# Patient Record
Sex: Female | Born: 1935 | Race: White | Hispanic: No | Smoking: Former smoker
Health system: Southern US, Community
[De-identification: ages and names within clinical notes are randomized; demographics above are authoritative.]

## PROBLEM LIST (undated history)

## (undated) DIAGNOSIS — J189 Pneumonia, unspecified organism: Secondary | ICD-10-CM

## (undated) DIAGNOSIS — R0609 Other forms of dyspnea: Secondary | ICD-10-CM

## (undated) DIAGNOSIS — J449 Chronic obstructive pulmonary disease, unspecified: Secondary | ICD-10-CM

## (undated) DIAGNOSIS — R0600 Dyspnea, unspecified: Secondary | ICD-10-CM

## (undated) DIAGNOSIS — I10 Essential (primary) hypertension: Secondary | ICD-10-CM

## (undated) DIAGNOSIS — K219 Gastro-esophageal reflux disease without esophagitis: Secondary | ICD-10-CM

## (undated) DIAGNOSIS — E119 Type 2 diabetes mellitus without complications: Secondary | ICD-10-CM

## (undated) DIAGNOSIS — E785 Hyperlipidemia, unspecified: Secondary | ICD-10-CM

## (undated) HISTORY — DX: Type 2 diabetes mellitus without complications: E11.9

## (undated) HISTORY — DX: Essential (primary) hypertension: I10

## (undated) HISTORY — PX: OTHER SURGICAL HISTORY: SHX169

## (undated) HISTORY — DX: Chronic obstructive pulmonary disease, unspecified: J44.9

## (undated) HISTORY — PX: BRAIN SURGERY: SHX531

## (undated) HISTORY — PX: TOTAL KNEE ARTHROPLASTY: SHX125

## (undated) HISTORY — PX: TONSILLECTOMY: SUR1361

## (undated) HISTORY — PX: CHOLECYSTECTOMY: SHX55

## (undated) HISTORY — DX: Hyperlipidemia, unspecified: E78.5

## (undated) HISTORY — DX: Gastro-esophageal reflux disease without esophagitis: K21.9

## (undated) HISTORY — DX: Dyspnea, unspecified: R06.00

## (undated) HISTORY — DX: Other forms of dyspnea: R06.09

---

## 2000-07-27 ENCOUNTER — Encounter: Admission: RE | Admit: 2000-07-27 | Discharge: 2000-07-27 | Payer: Self-pay | Admitting: Urology

## 2000-07-27 ENCOUNTER — Encounter: Payer: Self-pay | Admitting: Urology

## 2000-08-19 ENCOUNTER — Observation Stay (HOSPITAL_COMMUNITY): Admission: RE | Admit: 2000-08-19 | Discharge: 2000-08-20 | Payer: Self-pay | Admitting: Urology

## 2008-10-25 ENCOUNTER — Ambulatory Visit (HOSPITAL_BASED_OUTPATIENT_CLINIC_OR_DEPARTMENT_OTHER): Admission: RE | Admit: 2008-10-25 | Discharge: 2008-10-25 | Payer: Self-pay | Admitting: Urology

## 2010-04-17 ENCOUNTER — Ambulatory Visit: Payer: Self-pay | Admitting: Cardiovascular Disease

## 2010-04-24 ENCOUNTER — Telehealth: Payer: Self-pay | Admitting: Cardiovascular Disease

## 2010-05-01 ENCOUNTER — Telehealth: Payer: Self-pay | Admitting: Cardiovascular Disease

## 2010-07-29 NOTE — Progress Notes (Signed)
  Phone Note From Other Clinic   Caller: [facility] Call For: Dr. Kirke Corin Summary of Call: Echo films not good-need order for Dobutamine Stress test for tomorrow.  order faxed to registration. Initial call taken by: Dessie Coma  LPN,  April 24, 2010 11:07 AM     Appended Document:  Patient requests appt. for next week to give her a break.  Appt scheduled for Nov. 1st at 8am.  Patient notified.

## 2010-07-29 NOTE — Progress Notes (Signed)
Summary: stress test results  Phone Note Outgoing Call   Call placed by: Dessie Coma  LPN,  May 01, 2010 2:01 PM Call placed to: Patient Summary of Call: LMVM-notified patient stress test results was fine per Dr. Kirke Corin.  To call if any questions and copy will be faxed to [facility].

## 2010-10-08 LAB — POCT I-STAT 4, (NA,K, GLUC, HGB,HCT)
Glucose, Bld: 137 mg/dL — ABNORMAL HIGH (ref 70–99)
HCT: 39 % (ref 36.0–46.0)
Hemoglobin: 13.3 g/dL (ref 12.0–15.0)
Potassium: 4.3 mEq/L (ref 3.5–5.1)
Sodium: 136 mEq/L (ref 135–145)

## 2010-10-08 LAB — GLUCOSE, CAPILLARY: Glucose-Capillary: 113 mg/dL — ABNORMAL HIGH (ref 70–99)

## 2010-10-11 ENCOUNTER — Encounter: Payer: Self-pay | Admitting: Cardiovascular Disease

## 2010-11-11 NOTE — Op Note (Signed)
NAMEJENNYFER, Hannah [name]                 ACCOUNT NO.:  [id number]   MEDICAL RECORD NO.:  [id number]          PATIENT TYPE:  AMB   LOCATION:  [facility]                         FACILITY:  [facility]   PHYSICIAN:  Sigmund I. Patsi Sears, M.D.DATE OF BIRTH:  16-Jul-1931   DATE OF PROCEDURE:  10/25/2008  DATE OF DISCHARGE:                               OPERATIVE REPORT   PREOPERATIVE DIAGNOSIS:  1. Intrinsic sphincter deficiency.  2. Vaginal suture extrusion.   OPERATIONS:  1. Vaginal exploration, and excision of extruded Prolene suture.  2. Urethral injection with Macroplastique.   SURGEON:  Dr. Patsi Sears.   ANESTHESIA:  General LMA.   PREPARATION:  After appropriate preanesthesia, the patient is brought to  the operating room and placed on the operating room table in the dorsal  supine position where general LMA anesthesia was introduced.  She was  then replaced in dorsal lithotomy position where the pubis was prepped  with Betadine solution and draped in the usual fashion.   BRIEF HISTORY:  This 77 year old type 2 diabetic female, is status post  pelvic floor repair in 2002 for stress urinary incontinence and pelvic  floor descent, with a 40 pack year history of smoking.  The patient  currently has urinary incontinence, with three pads per day.  Urodynamics show a Valsalva leak point pressure of 45 cm of water,  indicating intrinsic sphincter deficiency.  Physical exam shows that she  has extrusion of vaginal permanent suture material on the left side, and  possibly on the right side.   PROCEDURE:  Vaginal inspection reveals extrusion of suture as noted in  the history on the left side.  Inspection reveals that the right side  permanent suture is submucosal, and can be seen through thinned vaginal  mucosa.  The suture is grasped with on the left side, and cut flush with  the vaginal mucosa, and removed.  No incision was made on the left side.  On the right side, an elliptical incision is made,  and the subcutaneous  Prolene suture is removed.  This appears to be a previous knot  formation.  The wound was irrigated with antibiotic irrigation, and  closed with three separate 2-0 Vicryl sutures.  No bleeding was noted.  Attention was then directed to the urethra, and the  bladder was drained of fluid.  The Foley is removed.  Cystoscope was  placed, and two vials of Macroplastique are injected into the  suburethral tissue, in the area of the urethral sphincter.  The patient  tolerated the procedure well.  She was awakened and taken to the  recovery room in good condition.      Sigmund I. Patsi Sears, M.D.  Electronically Signed     SIT/MEDQ  D:  10/25/2008  T:  10/25/2008  Job:  [id number]   cc:   Nadine Counts  Fax: [phone]

## 2010-11-11 NOTE — Letter (Signed)
April 17, 2010    Gaye Alken, N.P.  [facility]  [address]  [place], [place]  [address]   RE:  Hannah, [name]  MRN:  [id number]  /  DOB:  [birth year]   Dear Amy:   Thank you for referring Hannah [name] for further cardiac evaluation.  As  you are aware, this is a pleasant 77 year old female with the following  problem list:   1. Chronic obstructive pulmonary disease diagnosed about 30 years ago      according to the patient.  2. Prolonged history of tobacco use, quit recently in 2011.  3. Type 2 diabetes for at least 5 years.  4. Hypertension.  5. Hyperlipidemia.  6. Gastroesophageal reflux disease.   Hannah [name] is here today for evaluation of dyspnea and abnormal ECG.  The patient had chronic dyspnea for many years thought to be related to  her COPD.  She denies any chest pain.  She gets occasional dizziness.  She gets spells of dry cough with minimal sputum.  She is not able to do  much physical activity due to her significant dyspnea.  She has chronic  tachycardia.  She denies any previous cardiac history.  She had an  electrocardiogram done recently which was suggestive of septal infarct.  The patient has not had any stress testing or echocardiogram in the  past.  There has been no orthopnea, PND or lower extremity edema.   PAST MEDICAL HISTORY:  As outlined above.   MEDICATIONS:  1. Fluticasone 50 mcg once daily.  2. Metformin 500 mg two tablets in the morning and 500 in the      afternoon.  3. Protonix 40 mg once daily.  4. Amitriptyline 100 mg once daily.  5. Fenofibrate 200 mg once daily.  6. Lipitor 10 mg at bedtime.  7. Diovan hydrochlorothiazide 80/12.5 mg once daily.  8. Sanctura XR 60 mg once daily.  9. Calcium.  10.Spiriva.  11.Aspirin 325 mg once daily.   ALLERGIES:  INCLUDE CODEINE AND SULFA.   SOCIAL HISTORY:  This is remarkable for smoking one pack per day for at  least 30 years.  She quit in April of 2011.  There is no alcohol or  recreational drug use.  The patient does not exercise regularly.  She is  a widow.  She retired from a [employer] here in [place].   PAST SURGICAL HISTORY:  1. Left arm surgery.  2. Tonsillectomy.  3. Knee replacement.  4. Cholecystectomy.  5. 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   FAMILY HISTORY:  There is no family history of premature coronary artery  disease.  Her sister did have myocardial infarction but at the age of  56.  Her parents also had heart disease but both lived to be above 19  years old.   PHYSICAL EXAMINATION:  GENERAL:  The patient is pleasant, seems to be in  no acute distress although she has dyspnea even with walking from the  chair to the exam table.  VITAL SIGNS:  Weight is 168 pounds, blood pressure is 117/66, pulse is  111, oxygen saturation is 96% on room air.  HEENT:  Normocephalic/atraumatic.  NECK:  No JVD or carotid bruits.  RESPIRATORY:  Normal respiratory effort with no use of accessory  muscles.  Auscultation reveals significantly diminished breath sounds  bilaterally.  CARDIOVASCULAR:  Normal PMI, Normal S1 - S2 with no gallops or murmurs.  ABDOMEN:  Benign, nontender, nondistended.  EXTREMITIES:  With no clubbing, cyanosis or edema.  SKIN:  Warm and dry with no rash.  PSYCHIATRIC:  She is alert, oriented x3 with normal mood and affect.  MUSCULOSKELETAL:  There is normal muscle strength in the upper and lower  extremities.   An electrocardiogram was performed which showed sinus tachycardia with Q-  waves in V1 - V2 suggestive of prior septal infarct.  There are no  significant ST or T-wave changes.   IMPRESSION:  1. Exertional dyspnea with an abnormal ECG.  The patient does not have      any chest pain.  We will have to rule out angina equivalent      although I suspect that most of her dyspnea is related to her      chronic obstructive pulmonary disease.  Her electrocardiogram is      abnormal with  septal Q-waves which occasionally can be a normal      variant.  I recommend proceeding with dobutamine stress echo for      further evaluation.  I explained to her that even if her stress      test comes back normal, aggressive treatment of her risk factors is      recommended as is being done.  2. Hyperlipidemia:  She is taking fenofibrate and Lipitor.  Her target      LDL should be less than 100 due to her history of diabetes.  3. Hypertension:  Her blood pressure is well-controlled.  I suspect      that her tachycardia is related to chronic obstructive pulmonary      disease.  The patient should continue lifelong treatment with      aspirin regardless of the results of the stress test.  She will be      notified with the results and will follow up with me if the stress      test is abnormal.   Thank you for allowing me to participate in the care of your patient.    Sincerely,      Lorine Bears, MD  Electronically Signed    MA/MedQ  DD: 04/17/2010  DT: 04/17/2010  Job #: [id number]

## 2010-11-14 NOTE — Op Note (Signed)
[facility]  Patient:    JULE, WHITSEL                        MRN: [id number] Adm. Date:  [id number] Attending:  Laqueta Jean CC:         Dr. [facility]   Operative Report  PREOPERATIVE DIAGNOSIS:  Urinary incontinence.  POSTOPERATIVE DIAGNOSIS:  Urinary incontinence.  OPERATION:  Anterior vaginal vault repair.  Pubovaginal sling.  Rectocele repair.  SURGEON:  Sigmund I. Patsi Sears, M.D.  ANESTHESIA:  General (LMA).  PROCEDURE:  After appropriate preanesthesia, the patient was brought to the operating room and placed on the operating room table in the dorsal supine position where general LMA anesthesia was induced.  She was then replaced in the low Allen stirrup dorsolithotomy position.  The pubis was prepped with Betadine solution and draped in the usual fashion.  REVIEW OF HISTORY:  This 77 year old widowed white female, para 2-2-0, has mixed stress and urge incontinence, worsening over the last 20 years.  She cannot exercise because of her stress incontinence symptoms.  She has a 30-pack-year history of smoking and a 30-pound weight gain since her retirement three years ago.  The physical examination showed a positive Marshall test and a positive Q-Tip test with a grade 2-3 cystourethrocele and a rectocele found.  She had urodynamics showing a 14 cc/second maximum flow (75% percentile), with evidence of bladder instability with Valsalva maneuver. She has decreased compliance but obstructed flow.  She had a leakpoing pressure of only 11 cm of water.  She now presents for a pubovaginal sling and vaginal vault repair.  PROCEDURE:  20 cc of 0.5% Marcaine with 1:200,000 were injected into the pubovaginal cervical arch tissue.  A midline incision was then made from the area just anterior to the cervix to the base of the urethra, and subcutaneous tissue was dissected with the Strully scissors.  Tissue was dissected  both bluntly and sharply into the retropubic space bilaterally.  Following this, anterior vaginal vault repair was accomplished with horizontal mattress 3-0 Vicryl pop-off sutures, and using the vaginal anchor system, #1 Prolene sutures were placed in the retropubic bone.  A T-shaped portion of 6 x 8 ______ fascia was then marked and cut.  The edges were folded and sutured with 3-0 Vicryl sutures, and the T-shaped wings were then passed into the retropubic space and ligated over the Prolene sutures. Following this, care was taken to avoid making the fascia too tight, by placing a right angle clamp in between the fascia and the urethra.  The fascia was packed in the midline at the 12 oclock position with 3-0 Vicryl suture.  It was also tacked at the 3 oclock and 9 oclock positions with the same suture.  The T-shaped portion was then brought over the [facility] plication of the vaginal vault and tacked in position at the 5 and 7 oclock positions.  The devascularized tip of the vaginal tissue was excised, and 3-0 Vicryl subcu sutures were then placed in horizontal mattress fashion, and then buried 3-0 Vicryl running suture was used to accomplish vaginal epithelial closure.  Attention was then directed to the lax rectocele.  10 cc of normal saline were injected into the perirectal vaginal fascia bilaterally.  The patient had previously had a small rectocele repair.  A horizontal incision was then made at the area just inside the anus, and tissue was then dissected lateralward and brought into a point  at the posterior portion of the vagina.  The epithelium was dissected free from the rectal mucosa and discarded.  Minimal bleeding was noted.  A portion of fascia was then placed in the wound and sutured in place along the edges with 3-0 Vicryl sutures.  The epithelium was then closed with buried 2-0 Vicryl sutures.  This afforded excellent rectocele repair.  Following this, the wound was irrigated  with antibiotic irrigation. No bleeding was noted.  The vagina was packed with vaginal packing with antibiotic ointment, and the patient received IV Toradol ______ suppository. She was awakened and taken to the recovery room in excellent condition. DD:  08/19/00 TD:  08/20/00 Job: [id number]

## 2011-01-09 ENCOUNTER — Encounter: Payer: Self-pay | Admitting: Cardiovascular Disease

## 2011-04-24 ENCOUNTER — Ambulatory Visit: Payer: Self-pay | Admitting: Internal Medicine

## 2015-04-11 DIAGNOSIS — J208 Acute bronchitis due to other specified organisms: Secondary | ICD-10-CM | POA: Diagnosis not present

## 2015-04-11 DIAGNOSIS — Z6827 Body mass index (BMI) 27.0-27.9, adult: Secondary | ICD-10-CM | POA: Diagnosis not present

## 2015-04-11 DIAGNOSIS — Z720 Tobacco use: Secondary | ICD-10-CM | POA: Diagnosis not present

## 2015-04-11 DIAGNOSIS — J449 Chronic obstructive pulmonary disease, unspecified: Secondary | ICD-10-CM | POA: Diagnosis not present

## 2015-04-13 DIAGNOSIS — J449 Chronic obstructive pulmonary disease, unspecified: Secondary | ICD-10-CM | POA: Diagnosis not present

## 2015-04-16 DIAGNOSIS — J449 Chronic obstructive pulmonary disease, unspecified: Secondary | ICD-10-CM | POA: Diagnosis not present

## 2015-04-29 DIAGNOSIS — D510 Vitamin B12 deficiency anemia due to intrinsic factor deficiency: Secondary | ICD-10-CM | POA: Diagnosis not present

## 2015-05-14 DIAGNOSIS — J449 Chronic obstructive pulmonary disease, unspecified: Secondary | ICD-10-CM | POA: Diagnosis not present

## 2015-05-14 DIAGNOSIS — Z6828 Body mass index (BMI) 28.0-28.9, adult: Secondary | ICD-10-CM | POA: Diagnosis not present

## 2015-05-14 DIAGNOSIS — D510 Vitamin B12 deficiency anemia due to intrinsic factor deficiency: Secondary | ICD-10-CM | POA: Diagnosis not present

## 2015-05-14 DIAGNOSIS — E1149 Type 2 diabetes mellitus with other diabetic neurological complication: Secondary | ICD-10-CM | POA: Diagnosis not present

## 2015-05-14 DIAGNOSIS — E785 Hyperlipidemia, unspecified: Secondary | ICD-10-CM | POA: Diagnosis not present

## 2015-05-14 DIAGNOSIS — I10 Essential (primary) hypertension: Secondary | ICD-10-CM | POA: Diagnosis not present

## 2015-05-17 DIAGNOSIS — J449 Chronic obstructive pulmonary disease, unspecified: Secondary | ICD-10-CM | POA: Diagnosis not present

## 2015-05-18 DIAGNOSIS — E119 Type 2 diabetes mellitus without complications: Secondary | ICD-10-CM | POA: Diagnosis not present

## 2015-05-31 DIAGNOSIS — D510 Vitamin B12 deficiency anemia due to intrinsic factor deficiency: Secondary | ICD-10-CM | POA: Diagnosis not present

## 2015-05-31 DIAGNOSIS — Z6822 Body mass index (BMI) 22.0-22.9, adult: Secondary | ICD-10-CM | POA: Diagnosis not present

## 2015-05-31 DIAGNOSIS — I10 Essential (primary) hypertension: Secondary | ICD-10-CM | POA: Diagnosis not present

## 2015-06-06 DIAGNOSIS — Z1231 Encounter for screening mammogram for malignant neoplasm of breast: Secondary | ICD-10-CM | POA: Diagnosis not present

## 2015-06-13 DIAGNOSIS — D510 Vitamin B12 deficiency anemia due to intrinsic factor deficiency: Secondary | ICD-10-CM | POA: Diagnosis not present

## 2015-06-13 DIAGNOSIS — M1990 Unspecified osteoarthritis, unspecified site: Secondary | ICD-10-CM | POA: Diagnosis not present

## 2015-06-13 DIAGNOSIS — Z6828 Body mass index (BMI) 28.0-28.9, adult: Secondary | ICD-10-CM | POA: Diagnosis not present

## 2015-06-13 DIAGNOSIS — J449 Chronic obstructive pulmonary disease, unspecified: Secondary | ICD-10-CM | POA: Diagnosis not present

## 2015-06-13 DIAGNOSIS — E1149 Type 2 diabetes mellitus with other diabetic neurological complication: Secondary | ICD-10-CM | POA: Diagnosis not present

## 2015-06-13 DIAGNOSIS — R69 Illness, unspecified: Secondary | ICD-10-CM | POA: Diagnosis not present

## 2015-06-13 DIAGNOSIS — I10 Essential (primary) hypertension: Secondary | ICD-10-CM | POA: Diagnosis not present

## 2015-06-16 DIAGNOSIS — J449 Chronic obstructive pulmonary disease, unspecified: Secondary | ICD-10-CM | POA: Diagnosis not present

## 2015-06-26 DIAGNOSIS — S81001A Unspecified open wound, right knee, initial encounter: Secondary | ICD-10-CM | POA: Diagnosis not present

## 2015-06-26 DIAGNOSIS — Z23 Encounter for immunization: Secondary | ICD-10-CM | POA: Diagnosis not present

## 2015-06-26 DIAGNOSIS — Z6827 Body mass index (BMI) 27.0-27.9, adult: Secondary | ICD-10-CM | POA: Diagnosis not present

## 2015-07-02 DIAGNOSIS — M25531 Pain in right wrist: Secondary | ICD-10-CM | POA: Diagnosis not present

## 2015-07-09 DIAGNOSIS — J441 Chronic obstructive pulmonary disease with (acute) exacerbation: Secondary | ICD-10-CM | POA: Diagnosis not present

## 2015-07-09 DIAGNOSIS — Z6827 Body mass index (BMI) 27.0-27.9, adult: Secondary | ICD-10-CM | POA: Diagnosis not present

## 2015-07-09 DIAGNOSIS — S81012D Laceration without foreign body, left knee, subsequent encounter: Secondary | ICD-10-CM | POA: Diagnosis not present

## 2015-07-09 DIAGNOSIS — R42 Dizziness and giddiness: Secondary | ICD-10-CM | POA: Diagnosis not present

## 2015-07-09 DIAGNOSIS — D510 Vitamin B12 deficiency anemia due to intrinsic factor deficiency: Secondary | ICD-10-CM | POA: Diagnosis not present

## 2015-07-14 DIAGNOSIS — J449 Chronic obstructive pulmonary disease, unspecified: Secondary | ICD-10-CM | POA: Diagnosis not present

## 2015-07-17 DIAGNOSIS — J449 Chronic obstructive pulmonary disease, unspecified: Secondary | ICD-10-CM | POA: Diagnosis not present

## 2015-07-19 DIAGNOSIS — Z9842 Cataract extraction status, left eye: Secondary | ICD-10-CM | POA: Diagnosis not present

## 2015-07-19 DIAGNOSIS — E119 Type 2 diabetes mellitus without complications: Secondary | ICD-10-CM | POA: Diagnosis not present

## 2015-07-19 DIAGNOSIS — Z7984 Long term (current) use of oral hypoglycemic drugs: Secondary | ICD-10-CM | POA: Diagnosis not present

## 2015-07-19 DIAGNOSIS — Z961 Presence of intraocular lens: Secondary | ICD-10-CM | POA: Diagnosis not present

## 2015-07-19 DIAGNOSIS — H5203 Hypermetropia, bilateral: Secondary | ICD-10-CM | POA: Diagnosis not present

## 2015-07-19 DIAGNOSIS — H43813 Vitreous degeneration, bilateral: Secondary | ICD-10-CM | POA: Diagnosis not present

## 2015-07-19 DIAGNOSIS — H52223 Regular astigmatism, bilateral: Secondary | ICD-10-CM | POA: Diagnosis not present

## 2015-07-19 DIAGNOSIS — H35373 Puckering of macula, bilateral: Secondary | ICD-10-CM | POA: Diagnosis not present

## 2015-07-19 DIAGNOSIS — Z9841 Cataract extraction status, right eye: Secondary | ICD-10-CM | POA: Diagnosis not present

## 2015-07-19 DIAGNOSIS — Z0100 Encounter for examination of eyes and vision without abnormal findings: Secondary | ICD-10-CM | POA: Diagnosis not present

## 2015-07-19 DIAGNOSIS — H43393 Other vitreous opacities, bilateral: Secondary | ICD-10-CM | POA: Diagnosis not present

## 2015-08-06 DIAGNOSIS — R600 Localized edema: Secondary | ICD-10-CM | POA: Diagnosis not present

## 2015-08-06 DIAGNOSIS — S098XXA Other specified injuries of head, initial encounter: Secondary | ICD-10-CM | POA: Diagnosis not present

## 2015-08-06 DIAGNOSIS — Z6828 Body mass index (BMI) 28.0-28.9, adult: Secondary | ICD-10-CM | POA: Diagnosis not present

## 2015-08-06 DIAGNOSIS — R300 Dysuria: Secondary | ICD-10-CM | POA: Diagnosis not present

## 2015-08-06 DIAGNOSIS — G44319 Acute post-traumatic headache, not intractable: Secondary | ICD-10-CM | POA: Diagnosis not present

## 2015-08-07 DIAGNOSIS — G44319 Acute post-traumatic headache, not intractable: Secondary | ICD-10-CM | POA: Diagnosis not present

## 2015-08-08 DIAGNOSIS — R930 Abnormal findings on diagnostic imaging of skull and head, not elsewhere classified: Secondary | ICD-10-CM | POA: Diagnosis not present

## 2015-08-09 ENCOUNTER — Other Ambulatory Visit: Payer: Self-pay | Admitting: Internal Medicine

## 2015-08-09 DIAGNOSIS — S098XXA Other specified injuries of head, initial encounter: Secondary | ICD-10-CM

## 2015-08-12 DIAGNOSIS — D510 Vitamin B12 deficiency anemia due to intrinsic factor deficiency: Secondary | ICD-10-CM | POA: Diagnosis not present

## 2015-08-14 DIAGNOSIS — J449 Chronic obstructive pulmonary disease, unspecified: Secondary | ICD-10-CM | POA: Diagnosis not present

## 2015-08-17 DIAGNOSIS — E119 Type 2 diabetes mellitus without complications: Secondary | ICD-10-CM | POA: Diagnosis not present

## 2015-08-17 DIAGNOSIS — J449 Chronic obstructive pulmonary disease, unspecified: Secondary | ICD-10-CM | POA: Diagnosis not present

## 2015-08-20 ENCOUNTER — Ambulatory Visit
Admission: RE | Admit: 2015-08-20 | Discharge: 2015-08-20 | Disposition: A | Discharge status: Home / Self Care | Payer: Medicare HMO | Source: Ambulatory Visit | Attending: Internal Medicine | Admitting: Internal Medicine

## 2015-08-20 DIAGNOSIS — R51 Headache: Secondary | ICD-10-CM | POA: Diagnosis not present

## 2015-08-20 DIAGNOSIS — S098XXA Other specified injuries of head, initial encounter: Secondary | ICD-10-CM

## 2015-08-20 MED ORDER — GADOBENATE DIMEGLUMINE 529 MG/ML IV SOLN: 7.0000 mL | Freq: Once | INTRAVENOUS | Status: DC | PRN

## 2015-08-23 DIAGNOSIS — S098XXD Other specified injuries of head, subsequent encounter: Secondary | ICD-10-CM | POA: Diagnosis not present

## 2015-08-23 DIAGNOSIS — N3281 Overactive bladder: Secondary | ICD-10-CM | POA: Diagnosis not present

## 2015-08-23 DIAGNOSIS — I10 Essential (primary) hypertension: Secondary | ICD-10-CM | POA: Diagnosis not present

## 2015-08-23 DIAGNOSIS — Z6827 Body mass index (BMI) 27.0-27.9, adult: Secondary | ICD-10-CM | POA: Diagnosis not present

## 2015-08-23 DIAGNOSIS — G44319 Acute post-traumatic headache, not intractable: Secondary | ICD-10-CM | POA: Diagnosis not present

## 2015-08-23 DIAGNOSIS — R600 Localized edema: Secondary | ICD-10-CM | POA: Diagnosis not present

## 2015-09-11 DIAGNOSIS — D510 Vitamin B12 deficiency anemia due to intrinsic factor deficiency: Secondary | ICD-10-CM | POA: Diagnosis not present

## 2015-09-11 DIAGNOSIS — J449 Chronic obstructive pulmonary disease, unspecified: Secondary | ICD-10-CM | POA: Diagnosis not present

## 2015-09-14 DIAGNOSIS — J449 Chronic obstructive pulmonary disease, unspecified: Secondary | ICD-10-CM | POA: Diagnosis not present

## 2015-10-12 DIAGNOSIS — J449 Chronic obstructive pulmonary disease, unspecified: Secondary | ICD-10-CM | POA: Diagnosis not present

## 2015-10-15 DIAGNOSIS — J449 Chronic obstructive pulmonary disease, unspecified: Secondary | ICD-10-CM | POA: Diagnosis not present

## 2015-10-29 DIAGNOSIS — Z96642 Presence of left artificial hip joint: Secondary | ICD-10-CM | POA: Diagnosis not present

## 2015-10-29 DIAGNOSIS — R69 Illness, unspecified: Secondary | ICD-10-CM | POA: Diagnosis not present

## 2015-10-29 DIAGNOSIS — S199XXA Unspecified injury of neck, initial encounter: Secondary | ICD-10-CM | POA: Diagnosis not present

## 2015-10-29 DIAGNOSIS — D62 Acute posthemorrhagic anemia: Secondary | ICD-10-CM | POA: Diagnosis not present

## 2015-10-29 DIAGNOSIS — S01112A Laceration without foreign body of left eyelid and periocular area, initial encounter: Secondary | ICD-10-CM | POA: Diagnosis not present

## 2015-10-29 DIAGNOSIS — S79911A Unspecified injury of right hip, initial encounter: Secondary | ICD-10-CM | POA: Diagnosis not present

## 2015-10-29 DIAGNOSIS — E785 Hyperlipidemia, unspecified: Secondary | ICD-10-CM | POA: Diagnosis not present

## 2015-10-29 DIAGNOSIS — S79912A Unspecified injury of left hip, initial encounter: Secondary | ICD-10-CM | POA: Diagnosis not present

## 2015-10-29 DIAGNOSIS — E119 Type 2 diabetes mellitus without complications: Secondary | ICD-10-CM | POA: Diagnosis not present

## 2015-10-29 DIAGNOSIS — J449 Chronic obstructive pulmonary disease, unspecified: Secondary | ICD-10-CM | POA: Diagnosis not present

## 2015-10-29 DIAGNOSIS — W01198A Fall on same level from slipping, tripping and stumbling with subsequent striking against other object, initial encounter: Secondary | ICD-10-CM | POA: Diagnosis not present

## 2015-10-29 DIAGNOSIS — S0990XA Unspecified injury of head, initial encounter: Secondary | ICD-10-CM | POA: Diagnosis not present

## 2015-10-29 DIAGNOSIS — R062 Wheezing: Secondary | ICD-10-CM | POA: Diagnosis not present

## 2015-10-29 DIAGNOSIS — Z833 Family history of diabetes mellitus: Secondary | ICD-10-CM | POA: Diagnosis not present

## 2015-10-29 DIAGNOSIS — I10 Essential (primary) hypertension: Secondary | ICD-10-CM | POA: Diagnosis not present

## 2015-10-29 DIAGNOSIS — R220 Localized swelling, mass and lump, head: Secondary | ICD-10-CM | POA: Diagnosis not present

## 2015-10-30 DIAGNOSIS — W01198A Fall on same level from slipping, tripping and stumbling with subsequent striking against other object, initial encounter: Secondary | ICD-10-CM | POA: Diagnosis not present

## 2015-10-30 DIAGNOSIS — D62 Acute posthemorrhagic anemia: Secondary | ICD-10-CM | POA: Diagnosis not present

## 2015-10-30 DIAGNOSIS — E119 Type 2 diabetes mellitus without complications: Secondary | ICD-10-CM | POA: Diagnosis not present

## 2015-10-30 DIAGNOSIS — Y92002 Bathroom of unspecified non-institutional (private) residence single-family (private) house as the place of occurrence of the external cause: Secondary | ICD-10-CM | POA: Diagnosis not present

## 2015-11-04 DIAGNOSIS — W19XXXD Unspecified fall, subsequent encounter: Secondary | ICD-10-CM | POA: Diagnosis not present

## 2015-11-04 DIAGNOSIS — Z789 Other specified health status: Secondary | ICD-10-CM | POA: Diagnosis not present

## 2015-11-04 DIAGNOSIS — I10 Essential (primary) hypertension: Secondary | ICD-10-CM | POA: Diagnosis not present

## 2015-11-04 DIAGNOSIS — E785 Hyperlipidemia, unspecified: Secondary | ICD-10-CM | POA: Diagnosis not present

## 2015-11-04 DIAGNOSIS — M7989 Other specified soft tissue disorders: Secondary | ICD-10-CM | POA: Diagnosis not present

## 2015-11-04 DIAGNOSIS — Z1389 Encounter for screening for other disorder: Secondary | ICD-10-CM | POA: Diagnosis not present

## 2015-11-04 DIAGNOSIS — Y92099 Unspecified place in other non-institutional residence as the place of occurrence of the external cause: Secondary | ICD-10-CM | POA: Diagnosis not present

## 2015-11-04 DIAGNOSIS — R300 Dysuria: Secondary | ICD-10-CM | POA: Diagnosis not present

## 2015-11-04 DIAGNOSIS — E538 Deficiency of other specified B group vitamins: Secondary | ICD-10-CM | POA: Diagnosis not present

## 2015-11-04 DIAGNOSIS — E1165 Type 2 diabetes mellitus with hyperglycemia: Secondary | ICD-10-CM | POA: Diagnosis not present

## 2015-11-05 DIAGNOSIS — D649 Anemia, unspecified: Secondary | ICD-10-CM | POA: Diagnosis not present

## 2015-11-05 DIAGNOSIS — J449 Chronic obstructive pulmonary disease, unspecified: Secondary | ICD-10-CM | POA: Diagnosis not present

## 2015-11-05 DIAGNOSIS — E119 Type 2 diabetes mellitus without complications: Secondary | ICD-10-CM | POA: Diagnosis not present

## 2015-11-05 DIAGNOSIS — R296 Repeated falls: Secondary | ICD-10-CM | POA: Diagnosis not present

## 2015-11-05 DIAGNOSIS — B351 Tinea unguium: Secondary | ICD-10-CM | POA: Diagnosis not present

## 2015-11-05 DIAGNOSIS — E785 Hyperlipidemia, unspecified: Secondary | ICD-10-CM | POA: Diagnosis not present

## 2015-11-07 DIAGNOSIS — R079 Chest pain, unspecified: Secondary | ICD-10-CM | POA: Diagnosis not present

## 2015-11-07 DIAGNOSIS — I083 Combined rheumatic disorders of mitral, aortic and tricuspid valves: Secondary | ICD-10-CM | POA: Diagnosis not present

## 2015-11-09 DIAGNOSIS — R296 Repeated falls: Secondary | ICD-10-CM | POA: Diagnosis not present

## 2015-11-09 DIAGNOSIS — E785 Hyperlipidemia, unspecified: Secondary | ICD-10-CM | POA: Diagnosis not present

## 2015-11-09 DIAGNOSIS — D649 Anemia, unspecified: Secondary | ICD-10-CM | POA: Diagnosis not present

## 2015-11-09 DIAGNOSIS — J449 Chronic obstructive pulmonary disease, unspecified: Secondary | ICD-10-CM | POA: Diagnosis not present

## 2015-11-09 DIAGNOSIS — E119 Type 2 diabetes mellitus without complications: Secondary | ICD-10-CM | POA: Diagnosis not present

## 2015-11-11 DIAGNOSIS — J449 Chronic obstructive pulmonary disease, unspecified: Secondary | ICD-10-CM | POA: Diagnosis not present

## 2015-11-11 DIAGNOSIS — E785 Hyperlipidemia, unspecified: Secondary | ICD-10-CM | POA: Diagnosis not present

## 2015-11-11 DIAGNOSIS — R296 Repeated falls: Secondary | ICD-10-CM | POA: Diagnosis not present

## 2015-11-11 DIAGNOSIS — E119 Type 2 diabetes mellitus without complications: Secondary | ICD-10-CM | POA: Diagnosis not present

## 2015-11-11 DIAGNOSIS — D649 Anemia, unspecified: Secondary | ICD-10-CM | POA: Diagnosis not present

## 2015-11-12 DIAGNOSIS — S0101XA Laceration without foreign body of scalp, initial encounter: Secondary | ICD-10-CM | POA: Diagnosis not present

## 2015-11-12 DIAGNOSIS — R296 Repeated falls: Secondary | ICD-10-CM | POA: Diagnosis not present

## 2015-11-12 DIAGNOSIS — D649 Anemia, unspecified: Secondary | ICD-10-CM | POA: Diagnosis not present

## 2015-11-12 DIAGNOSIS — S51012A Laceration without foreign body of left elbow, initial encounter: Secondary | ICD-10-CM | POA: Diagnosis not present

## 2015-11-12 DIAGNOSIS — E119 Type 2 diabetes mellitus without complications: Secondary | ICD-10-CM | POA: Diagnosis not present

## 2015-11-12 DIAGNOSIS — E785 Hyperlipidemia, unspecified: Secondary | ICD-10-CM | POA: Diagnosis not present

## 2015-11-12 DIAGNOSIS — J449 Chronic obstructive pulmonary disease, unspecified: Secondary | ICD-10-CM | POA: Diagnosis not present

## 2015-11-13 DIAGNOSIS — D649 Anemia, unspecified: Secondary | ICD-10-CM | POA: Diagnosis not present

## 2015-11-13 DIAGNOSIS — E785 Hyperlipidemia, unspecified: Secondary | ICD-10-CM | POA: Diagnosis not present

## 2015-11-13 DIAGNOSIS — J449 Chronic obstructive pulmonary disease, unspecified: Secondary | ICD-10-CM | POA: Diagnosis not present

## 2015-11-13 DIAGNOSIS — R296 Repeated falls: Secondary | ICD-10-CM | POA: Diagnosis not present

## 2015-11-13 DIAGNOSIS — E119 Type 2 diabetes mellitus without complications: Secondary | ICD-10-CM | POA: Diagnosis not present

## 2015-11-14 DIAGNOSIS — E119 Type 2 diabetes mellitus without complications: Secondary | ICD-10-CM | POA: Diagnosis not present

## 2015-11-14 DIAGNOSIS — J449 Chronic obstructive pulmonary disease, unspecified: Secondary | ICD-10-CM | POA: Diagnosis not present

## 2015-11-14 DIAGNOSIS — R296 Repeated falls: Secondary | ICD-10-CM | POA: Diagnosis not present

## 2015-11-14 DIAGNOSIS — D649 Anemia, unspecified: Secondary | ICD-10-CM | POA: Diagnosis not present

## 2015-11-14 DIAGNOSIS — E785 Hyperlipidemia, unspecified: Secondary | ICD-10-CM | POA: Diagnosis not present

## 2015-11-15 DIAGNOSIS — D649 Anemia, unspecified: Secondary | ICD-10-CM | POA: Diagnosis not present

## 2015-11-15 DIAGNOSIS — E119 Type 2 diabetes mellitus without complications: Secondary | ICD-10-CM | POA: Diagnosis not present

## 2015-11-15 DIAGNOSIS — J449 Chronic obstructive pulmonary disease, unspecified: Secondary | ICD-10-CM | POA: Diagnosis not present

## 2015-11-15 DIAGNOSIS — R296 Repeated falls: Secondary | ICD-10-CM | POA: Diagnosis not present

## 2015-11-15 DIAGNOSIS — E785 Hyperlipidemia, unspecified: Secondary | ICD-10-CM | POA: Diagnosis not present

## 2015-11-16 DIAGNOSIS — E119 Type 2 diabetes mellitus without complications: Secondary | ICD-10-CM | POA: Diagnosis not present

## 2015-11-18 DIAGNOSIS — E119 Type 2 diabetes mellitus without complications: Secondary | ICD-10-CM | POA: Diagnosis not present

## 2015-11-18 DIAGNOSIS — E785 Hyperlipidemia, unspecified: Secondary | ICD-10-CM | POA: Diagnosis not present

## 2015-11-18 DIAGNOSIS — R296 Repeated falls: Secondary | ICD-10-CM | POA: Diagnosis not present

## 2015-11-18 DIAGNOSIS — J449 Chronic obstructive pulmonary disease, unspecified: Secondary | ICD-10-CM | POA: Diagnosis not present

## 2015-11-18 DIAGNOSIS — D649 Anemia, unspecified: Secondary | ICD-10-CM | POA: Diagnosis not present

## 2015-11-19 DIAGNOSIS — J449 Chronic obstructive pulmonary disease, unspecified: Secondary | ICD-10-CM | POA: Diagnosis not present

## 2015-11-19 DIAGNOSIS — R296 Repeated falls: Secondary | ICD-10-CM | POA: Diagnosis not present

## 2015-11-19 DIAGNOSIS — D539 Nutritional anemia, unspecified: Secondary | ICD-10-CM | POA: Diagnosis not present

## 2015-11-19 DIAGNOSIS — R6889 Other general symptoms and signs: Secondary | ICD-10-CM | POA: Diagnosis not present

## 2015-11-19 DIAGNOSIS — E119 Type 2 diabetes mellitus without complications: Secondary | ICD-10-CM | POA: Diagnosis not present

## 2015-11-19 DIAGNOSIS — D649 Anemia, unspecified: Secondary | ICD-10-CM | POA: Diagnosis not present

## 2015-11-19 DIAGNOSIS — E785 Hyperlipidemia, unspecified: Secondary | ICD-10-CM | POA: Diagnosis not present

## 2015-11-20 DIAGNOSIS — D649 Anemia, unspecified: Secondary | ICD-10-CM | POA: Diagnosis not present

## 2015-11-20 DIAGNOSIS — J449 Chronic obstructive pulmonary disease, unspecified: Secondary | ICD-10-CM | POA: Diagnosis not present

## 2015-11-20 DIAGNOSIS — E119 Type 2 diabetes mellitus without complications: Secondary | ICD-10-CM | POA: Diagnosis not present

## 2015-11-20 DIAGNOSIS — R296 Repeated falls: Secondary | ICD-10-CM | POA: Diagnosis not present

## 2015-11-20 DIAGNOSIS — E785 Hyperlipidemia, unspecified: Secondary | ICD-10-CM | POA: Diagnosis not present

## 2015-11-21 DIAGNOSIS — E119 Type 2 diabetes mellitus without complications: Secondary | ICD-10-CM | POA: Diagnosis not present

## 2015-11-21 DIAGNOSIS — D649 Anemia, unspecified: Secondary | ICD-10-CM | POA: Diagnosis not present

## 2015-11-21 DIAGNOSIS — E785 Hyperlipidemia, unspecified: Secondary | ICD-10-CM | POA: Diagnosis not present

## 2015-11-21 DIAGNOSIS — J449 Chronic obstructive pulmonary disease, unspecified: Secondary | ICD-10-CM | POA: Diagnosis not present

## 2015-11-21 DIAGNOSIS — R296 Repeated falls: Secondary | ICD-10-CM | POA: Diagnosis not present

## 2015-11-25 DIAGNOSIS — E785 Hyperlipidemia, unspecified: Secondary | ICD-10-CM | POA: Diagnosis not present

## 2015-11-25 DIAGNOSIS — D649 Anemia, unspecified: Secondary | ICD-10-CM | POA: Diagnosis not present

## 2015-11-25 DIAGNOSIS — R296 Repeated falls: Secondary | ICD-10-CM | POA: Diagnosis not present

## 2015-11-25 DIAGNOSIS — E119 Type 2 diabetes mellitus without complications: Secondary | ICD-10-CM | POA: Diagnosis not present

## 2015-11-25 DIAGNOSIS — J449 Chronic obstructive pulmonary disease, unspecified: Secondary | ICD-10-CM | POA: Diagnosis not present

## 2015-11-26 DIAGNOSIS — R296 Repeated falls: Secondary | ICD-10-CM | POA: Diagnosis not present

## 2015-11-26 DIAGNOSIS — E119 Type 2 diabetes mellitus without complications: Secondary | ICD-10-CM | POA: Diagnosis not present

## 2015-11-26 DIAGNOSIS — D649 Anemia, unspecified: Secondary | ICD-10-CM | POA: Diagnosis not present

## 2015-11-26 DIAGNOSIS — E785 Hyperlipidemia, unspecified: Secondary | ICD-10-CM | POA: Diagnosis not present

## 2015-11-26 DIAGNOSIS — J449 Chronic obstructive pulmonary disease, unspecified: Secondary | ICD-10-CM | POA: Diagnosis not present

## 2015-11-27 DIAGNOSIS — R296 Repeated falls: Secondary | ICD-10-CM | POA: Diagnosis not present

## 2015-11-27 DIAGNOSIS — E119 Type 2 diabetes mellitus without complications: Secondary | ICD-10-CM | POA: Diagnosis not present

## 2015-11-27 DIAGNOSIS — J449 Chronic obstructive pulmonary disease, unspecified: Secondary | ICD-10-CM | POA: Diagnosis not present

## 2015-11-27 DIAGNOSIS — D649 Anemia, unspecified: Secondary | ICD-10-CM | POA: Diagnosis not present

## 2015-11-27 DIAGNOSIS — E785 Hyperlipidemia, unspecified: Secondary | ICD-10-CM | POA: Diagnosis not present

## 2015-11-28 DIAGNOSIS — J449 Chronic obstructive pulmonary disease, unspecified: Secondary | ICD-10-CM | POA: Diagnosis not present

## 2015-11-28 DIAGNOSIS — E785 Hyperlipidemia, unspecified: Secondary | ICD-10-CM | POA: Diagnosis not present

## 2015-11-28 DIAGNOSIS — E119 Type 2 diabetes mellitus without complications: Secondary | ICD-10-CM | POA: Diagnosis not present

## 2015-11-28 DIAGNOSIS — D649 Anemia, unspecified: Secondary | ICD-10-CM | POA: Diagnosis not present

## 2015-11-28 DIAGNOSIS — R296 Repeated falls: Secondary | ICD-10-CM | POA: Diagnosis not present

## 2015-12-02 DIAGNOSIS — E785 Hyperlipidemia, unspecified: Secondary | ICD-10-CM | POA: Diagnosis not present

## 2015-12-02 DIAGNOSIS — R296 Repeated falls: Secondary | ICD-10-CM | POA: Diagnosis not present

## 2015-12-02 DIAGNOSIS — J449 Chronic obstructive pulmonary disease, unspecified: Secondary | ICD-10-CM | POA: Diagnosis not present

## 2015-12-02 DIAGNOSIS — D649 Anemia, unspecified: Secondary | ICD-10-CM | POA: Diagnosis not present

## 2015-12-02 DIAGNOSIS — E119 Type 2 diabetes mellitus without complications: Secondary | ICD-10-CM | POA: Diagnosis not present

## 2015-12-03 DIAGNOSIS — R296 Repeated falls: Secondary | ICD-10-CM | POA: Diagnosis not present

## 2015-12-03 DIAGNOSIS — E119 Type 2 diabetes mellitus without complications: Secondary | ICD-10-CM | POA: Diagnosis not present

## 2015-12-03 DIAGNOSIS — D649 Anemia, unspecified: Secondary | ICD-10-CM | POA: Diagnosis not present

## 2015-12-03 DIAGNOSIS — E785 Hyperlipidemia, unspecified: Secondary | ICD-10-CM | POA: Diagnosis not present

## 2015-12-03 DIAGNOSIS — J449 Chronic obstructive pulmonary disease, unspecified: Secondary | ICD-10-CM | POA: Diagnosis not present

## 2015-12-04 DIAGNOSIS — I361 Nonrheumatic tricuspid (valve) insufficiency: Secondary | ICD-10-CM | POA: Diagnosis not present

## 2015-12-04 DIAGNOSIS — I951 Orthostatic hypotension: Secondary | ICD-10-CM | POA: Diagnosis not present

## 2015-12-04 DIAGNOSIS — E119 Type 2 diabetes mellitus without complications: Secondary | ICD-10-CM | POA: Diagnosis not present

## 2015-12-04 DIAGNOSIS — I10 Essential (primary) hypertension: Secondary | ICD-10-CM | POA: Diagnosis not present

## 2015-12-04 DIAGNOSIS — J449 Chronic obstructive pulmonary disease, unspecified: Secondary | ICD-10-CM | POA: Diagnosis not present

## 2015-12-04 DIAGNOSIS — I340 Nonrheumatic mitral (valve) insufficiency: Secondary | ICD-10-CM | POA: Diagnosis not present

## 2015-12-04 DIAGNOSIS — D649 Anemia, unspecified: Secondary | ICD-10-CM | POA: Diagnosis not present

## 2015-12-04 DIAGNOSIS — I351 Nonrheumatic aortic (valve) insufficiency: Secondary | ICD-10-CM | POA: Diagnosis not present

## 2015-12-04 DIAGNOSIS — E785 Hyperlipidemia, unspecified: Secondary | ICD-10-CM | POA: Diagnosis not present

## 2015-12-04 DIAGNOSIS — Z8679 Personal history of other diseases of the circulatory system: Secondary | ICD-10-CM | POA: Diagnosis not present

## 2015-12-04 DIAGNOSIS — I429 Cardiomyopathy, unspecified: Secondary | ICD-10-CM | POA: Diagnosis not present

## 2015-12-04 DIAGNOSIS — R296 Repeated falls: Secondary | ICD-10-CM | POA: Diagnosis not present

## 2015-12-05 DIAGNOSIS — S0003XD Contusion of scalp, subsequent encounter: Secondary | ICD-10-CM | POA: Diagnosis not present

## 2015-12-05 DIAGNOSIS — M7022 Olecranon bursitis, left elbow: Secondary | ICD-10-CM | POA: Diagnosis not present

## 2015-12-05 DIAGNOSIS — E538 Deficiency of other specified B group vitamins: Secondary | ICD-10-CM | POA: Diagnosis not present

## 2015-12-05 DIAGNOSIS — R296 Repeated falls: Secondary | ICD-10-CM | POA: Diagnosis not present

## 2015-12-05 DIAGNOSIS — J449 Chronic obstructive pulmonary disease, unspecified: Secondary | ICD-10-CM | POA: Diagnosis not present

## 2015-12-05 DIAGNOSIS — E785 Hyperlipidemia, unspecified: Secondary | ICD-10-CM | POA: Diagnosis not present

## 2015-12-05 DIAGNOSIS — D649 Anemia, unspecified: Secondary | ICD-10-CM | POA: Diagnosis not present

## 2015-12-05 DIAGNOSIS — E119 Type 2 diabetes mellitus without complications: Secondary | ICD-10-CM | POA: Diagnosis not present

## 2015-12-10 DIAGNOSIS — E785 Hyperlipidemia, unspecified: Secondary | ICD-10-CM | POA: Diagnosis not present

## 2015-12-10 DIAGNOSIS — E119 Type 2 diabetes mellitus without complications: Secondary | ICD-10-CM | POA: Diagnosis not present

## 2015-12-10 DIAGNOSIS — J449 Chronic obstructive pulmonary disease, unspecified: Secondary | ICD-10-CM | POA: Diagnosis not present

## 2015-12-10 DIAGNOSIS — D649 Anemia, unspecified: Secondary | ICD-10-CM | POA: Diagnosis not present

## 2015-12-10 DIAGNOSIS — R296 Repeated falls: Secondary | ICD-10-CM | POA: Diagnosis not present

## 2015-12-12 DIAGNOSIS — J449 Chronic obstructive pulmonary disease, unspecified: Secondary | ICD-10-CM | POA: Diagnosis not present

## 2015-12-15 DIAGNOSIS — J449 Chronic obstructive pulmonary disease, unspecified: Secondary | ICD-10-CM | POA: Diagnosis not present

## 2015-12-17 DIAGNOSIS — D649 Anemia, unspecified: Secondary | ICD-10-CM | POA: Diagnosis not present

## 2015-12-17 DIAGNOSIS — R296 Repeated falls: Secondary | ICD-10-CM | POA: Diagnosis not present

## 2015-12-17 DIAGNOSIS — J449 Chronic obstructive pulmonary disease, unspecified: Secondary | ICD-10-CM | POA: Diagnosis not present

## 2015-12-17 DIAGNOSIS — E785 Hyperlipidemia, unspecified: Secondary | ICD-10-CM | POA: Diagnosis not present

## 2015-12-17 DIAGNOSIS — E119 Type 2 diabetes mellitus without complications: Secondary | ICD-10-CM | POA: Diagnosis not present

## 2015-12-19 DIAGNOSIS — D649 Anemia, unspecified: Secondary | ICD-10-CM | POA: Diagnosis not present

## 2015-12-19 DIAGNOSIS — J449 Chronic obstructive pulmonary disease, unspecified: Secondary | ICD-10-CM | POA: Diagnosis not present

## 2015-12-19 DIAGNOSIS — E119 Type 2 diabetes mellitus without complications: Secondary | ICD-10-CM | POA: Diagnosis not present

## 2015-12-19 DIAGNOSIS — E785 Hyperlipidemia, unspecified: Secondary | ICD-10-CM | POA: Diagnosis not present

## 2015-12-19 DIAGNOSIS — R296 Repeated falls: Secondary | ICD-10-CM | POA: Diagnosis not present

## 2015-12-30 DIAGNOSIS — E538 Deficiency of other specified B group vitamins: Secondary | ICD-10-CM | POA: Diagnosis not present

## 2016-01-11 DIAGNOSIS — J449 Chronic obstructive pulmonary disease, unspecified: Secondary | ICD-10-CM | POA: Diagnosis not present

## 2016-01-14 DIAGNOSIS — J449 Chronic obstructive pulmonary disease, unspecified: Secondary | ICD-10-CM | POA: Diagnosis not present

## 2016-01-22 DIAGNOSIS — E7800 Pure hypercholesterolemia, unspecified: Secondary | ICD-10-CM | POA: Diagnosis not present

## 2016-01-22 DIAGNOSIS — Z0000 Encounter for general adult medical examination without abnormal findings: Secondary | ICD-10-CM | POA: Diagnosis not present

## 2016-01-22 DIAGNOSIS — I10 Essential (primary) hypertension: Secondary | ICD-10-CM | POA: Diagnosis not present

## 2016-01-22 DIAGNOSIS — R69 Illness, unspecified: Secondary | ICD-10-CM | POA: Diagnosis not present

## 2016-01-22 DIAGNOSIS — K219 Gastro-esophageal reflux disease without esophagitis: Secondary | ICD-10-CM | POA: Diagnosis not present

## 2016-01-22 DIAGNOSIS — J449 Chronic obstructive pulmonary disease, unspecified: Secondary | ICD-10-CM | POA: Diagnosis not present

## 2016-01-22 DIAGNOSIS — E119 Type 2 diabetes mellitus without complications: Secondary | ICD-10-CM | POA: Diagnosis not present

## 2016-02-11 DIAGNOSIS — J449 Chronic obstructive pulmonary disease, unspecified: Secondary | ICD-10-CM | POA: Diagnosis not present

## 2016-02-14 DIAGNOSIS — J449 Chronic obstructive pulmonary disease, unspecified: Secondary | ICD-10-CM | POA: Diagnosis not present

## 2016-02-15 DIAGNOSIS — E119 Type 2 diabetes mellitus without complications: Secondary | ICD-10-CM | POA: Diagnosis not present

## 2016-03-05 DIAGNOSIS — I10 Essential (primary) hypertension: Secondary | ICD-10-CM | POA: Diagnosis not present

## 2016-03-05 DIAGNOSIS — E1149 Type 2 diabetes mellitus with other diabetic neurological complication: Secondary | ICD-10-CM | POA: Diagnosis not present

## 2016-03-05 DIAGNOSIS — D510 Vitamin B12 deficiency anemia due to intrinsic factor deficiency: Secondary | ICD-10-CM | POA: Diagnosis not present

## 2016-03-05 DIAGNOSIS — E785 Hyperlipidemia, unspecified: Secondary | ICD-10-CM | POA: Diagnosis not present

## 2016-03-05 DIAGNOSIS — J40 Bronchitis, not specified as acute or chronic: Secondary | ICD-10-CM | POA: Diagnosis not present

## 2016-03-05 DIAGNOSIS — Z789 Other specified health status: Secondary | ICD-10-CM | POA: Diagnosis not present

## 2016-03-05 DIAGNOSIS — I5022 Chronic systolic (congestive) heart failure: Secondary | ICD-10-CM | POA: Diagnosis not present

## 2016-03-13 DIAGNOSIS — J449 Chronic obstructive pulmonary disease, unspecified: Secondary | ICD-10-CM | POA: Diagnosis not present

## 2016-03-16 DIAGNOSIS — J449 Chronic obstructive pulmonary disease, unspecified: Secondary | ICD-10-CM | POA: Diagnosis not present

## 2016-04-12 DIAGNOSIS — J449 Chronic obstructive pulmonary disease, unspecified: Secondary | ICD-10-CM | POA: Diagnosis not present

## 2016-04-15 DIAGNOSIS — J449 Chronic obstructive pulmonary disease, unspecified: Secondary | ICD-10-CM | POA: Diagnosis not present

## 2016-05-09 DIAGNOSIS — R109 Unspecified abdominal pain: Secondary | ICD-10-CM | POA: Diagnosis not present

## 2016-05-09 DIAGNOSIS — Z8673 Personal history of transient ischemic attack (TIA), and cerebral infarction without residual deficits: Secondary | ICD-10-CM | POA: Diagnosis not present

## 2016-05-09 DIAGNOSIS — R0600 Dyspnea, unspecified: Secondary | ICD-10-CM | POA: Diagnosis not present

## 2016-05-09 DIAGNOSIS — R531 Weakness: Secondary | ICD-10-CM | POA: Diagnosis not present

## 2016-05-09 DIAGNOSIS — R062 Wheezing: Secondary | ICD-10-CM | POA: Diagnosis not present

## 2016-05-09 DIAGNOSIS — E119 Type 2 diabetes mellitus without complications: Secondary | ICD-10-CM | POA: Diagnosis not present

## 2016-05-09 DIAGNOSIS — I509 Heart failure, unspecified: Secondary | ICD-10-CM | POA: Diagnosis not present

## 2016-05-09 DIAGNOSIS — R42 Dizziness and giddiness: Secondary | ICD-10-CM | POA: Diagnosis not present

## 2016-05-09 DIAGNOSIS — R1110 Vomiting, unspecified: Secondary | ICD-10-CM | POA: Diagnosis not present

## 2016-05-09 DIAGNOSIS — R0602 Shortness of breath: Secondary | ICD-10-CM | POA: Diagnosis not present

## 2016-05-09 DIAGNOSIS — I110 Hypertensive heart disease with heart failure: Secondary | ICD-10-CM | POA: Diagnosis not present

## 2016-05-09 DIAGNOSIS — K5900 Constipation, unspecified: Secondary | ICD-10-CM | POA: Diagnosis not present

## 2016-05-13 DIAGNOSIS — J449 Chronic obstructive pulmonary disease, unspecified: Secondary | ICD-10-CM | POA: Diagnosis not present

## 2016-05-14 DIAGNOSIS — I110 Hypertensive heart disease with heart failure: Secondary | ICD-10-CM | POA: Diagnosis not present

## 2016-05-14 DIAGNOSIS — I5023 Acute on chronic systolic (congestive) heart failure: Secondary | ICD-10-CM | POA: Diagnosis not present

## 2016-05-14 DIAGNOSIS — R0602 Shortness of breath: Secondary | ICD-10-CM | POA: Diagnosis not present

## 2016-05-14 DIAGNOSIS — I4891 Unspecified atrial fibrillation: Secondary | ICD-10-CM | POA: Diagnosis not present

## 2016-05-14 DIAGNOSIS — I5040 Unspecified combined systolic (congestive) and diastolic (congestive) heart failure: Secondary | ICD-10-CM | POA: Diagnosis not present

## 2016-05-14 DIAGNOSIS — R0600 Dyspnea, unspecified: Secondary | ICD-10-CM | POA: Diagnosis not present

## 2016-05-14 DIAGNOSIS — I5043 Acute on chronic combined systolic (congestive) and diastolic (congestive) heart failure: Secondary | ICD-10-CM | POA: Diagnosis not present

## 2016-05-14 DIAGNOSIS — J449 Chronic obstructive pulmonary disease, unspecified: Secondary | ICD-10-CM | POA: Diagnosis not present

## 2016-05-14 DIAGNOSIS — R69 Illness, unspecified: Secondary | ICD-10-CM | POA: Diagnosis not present

## 2016-05-14 DIAGNOSIS — I509 Heart failure, unspecified: Secondary | ICD-10-CM | POA: Diagnosis not present

## 2016-05-14 DIAGNOSIS — I491 Atrial premature depolarization: Secondary | ICD-10-CM | POA: Diagnosis not present

## 2016-05-14 DIAGNOSIS — E871 Hypo-osmolality and hyponatremia: Secondary | ICD-10-CM | POA: Diagnosis not present

## 2016-05-14 DIAGNOSIS — D649 Anemia, unspecified: Secondary | ICD-10-CM | POA: Diagnosis not present

## 2016-05-14 DIAGNOSIS — J810 Acute pulmonary edema: Secondary | ICD-10-CM | POA: Diagnosis not present

## 2016-05-14 DIAGNOSIS — J441 Chronic obstructive pulmonary disease with (acute) exacerbation: Secondary | ICD-10-CM | POA: Diagnosis not present

## 2016-05-14 DIAGNOSIS — E11649 Type 2 diabetes mellitus with hypoglycemia without coma: Secondary | ICD-10-CM | POA: Diagnosis not present

## 2016-05-14 DIAGNOSIS — R29818 Other symptoms and signs involving the nervous system: Secondary | ICD-10-CM | POA: Diagnosis not present

## 2016-05-14 DIAGNOSIS — R001 Bradycardia, unspecified: Secondary | ICD-10-CM | POA: Diagnosis not present

## 2016-05-14 DIAGNOSIS — N179 Acute kidney failure, unspecified: Secondary | ICD-10-CM | POA: Diagnosis not present

## 2016-05-14 DIAGNOSIS — R9431 Abnormal electrocardiogram [ECG] [EKG]: Secondary | ICD-10-CM | POA: Diagnosis not present

## 2016-05-14 DIAGNOSIS — N390 Urinary tract infection, site not specified: Secondary | ICD-10-CM | POA: Diagnosis not present

## 2016-05-14 DIAGNOSIS — E119 Type 2 diabetes mellitus without complications: Secondary | ICD-10-CM | POA: Diagnosis not present

## 2016-05-14 DIAGNOSIS — K219 Gastro-esophageal reflux disease without esophagitis: Secondary | ICD-10-CM | POA: Diagnosis not present

## 2016-05-14 DIAGNOSIS — R740 Nonspecific elevation of levels of transaminase and lactic acid dehydrogenase [LDH]: Secondary | ICD-10-CM | POA: Diagnosis not present

## 2016-05-14 DIAGNOSIS — E875 Hyperkalemia: Secondary | ICD-10-CM | POA: Diagnosis not present

## 2016-05-14 DIAGNOSIS — J9600 Acute respiratory failure, unspecified whether with hypoxia or hypercapnia: Secondary | ICD-10-CM | POA: Diagnosis not present

## 2016-05-14 DIAGNOSIS — R339 Retention of urine, unspecified: Secondary | ICD-10-CM | POA: Diagnosis not present

## 2016-05-14 DIAGNOSIS — J9610 Chronic respiratory failure, unspecified whether with hypoxia or hypercapnia: Secondary | ICD-10-CM | POA: Diagnosis not present

## 2016-05-15 DIAGNOSIS — J441 Chronic obstructive pulmonary disease with (acute) exacerbation: Secondary | ICD-10-CM | POA: Diagnosis not present

## 2016-05-15 DIAGNOSIS — J9610 Chronic respiratory failure, unspecified whether with hypoxia or hypercapnia: Secondary | ICD-10-CM | POA: Diagnosis not present

## 2016-05-15 DIAGNOSIS — E119 Type 2 diabetes mellitus without complications: Secondary | ICD-10-CM | POA: Diagnosis not present

## 2016-05-15 DIAGNOSIS — I5023 Acute on chronic systolic (congestive) heart failure: Secondary | ICD-10-CM | POA: Diagnosis not present

## 2016-05-16 DIAGNOSIS — I5023 Acute on chronic systolic (congestive) heart failure: Secondary | ICD-10-CM | POA: Diagnosis not present

## 2016-05-16 DIAGNOSIS — J9610 Chronic respiratory failure, unspecified whether with hypoxia or hypercapnia: Secondary | ICD-10-CM | POA: Diagnosis not present

## 2016-05-16 DIAGNOSIS — J441 Chronic obstructive pulmonary disease with (acute) exacerbation: Secondary | ICD-10-CM | POA: Diagnosis not present

## 2016-05-16 DIAGNOSIS — E119 Type 2 diabetes mellitus without complications: Secondary | ICD-10-CM | POA: Diagnosis not present

## 2016-05-16 DIAGNOSIS — J449 Chronic obstructive pulmonary disease, unspecified: Secondary | ICD-10-CM | POA: Diagnosis not present

## 2016-05-17 DIAGNOSIS — J441 Chronic obstructive pulmonary disease with (acute) exacerbation: Secondary | ICD-10-CM | POA: Diagnosis not present

## 2016-05-17 DIAGNOSIS — E119 Type 2 diabetes mellitus without complications: Secondary | ICD-10-CM | POA: Diagnosis not present

## 2016-05-17 DIAGNOSIS — I5023 Acute on chronic systolic (congestive) heart failure: Secondary | ICD-10-CM | POA: Diagnosis not present

## 2016-05-17 DIAGNOSIS — J9610 Chronic respiratory failure, unspecified whether with hypoxia or hypercapnia: Secondary | ICD-10-CM | POA: Diagnosis not present

## 2016-05-17 DIAGNOSIS — I509 Heart failure, unspecified: Secondary | ICD-10-CM | POA: Diagnosis not present

## 2016-05-17 DIAGNOSIS — R29818 Other symptoms and signs involving the nervous system: Secondary | ICD-10-CM | POA: Diagnosis not present

## 2016-05-18 DIAGNOSIS — E119 Type 2 diabetes mellitus without complications: Secondary | ICD-10-CM | POA: Diagnosis not present

## 2016-05-18 DIAGNOSIS — R0600 Dyspnea, unspecified: Secondary | ICD-10-CM | POA: Diagnosis not present

## 2016-05-18 DIAGNOSIS — J9610 Chronic respiratory failure, unspecified whether with hypoxia or hypercapnia: Secondary | ICD-10-CM | POA: Diagnosis not present

## 2016-05-18 DIAGNOSIS — I5023 Acute on chronic systolic (congestive) heart failure: Secondary | ICD-10-CM | POA: Diagnosis not present

## 2016-05-18 DIAGNOSIS — J441 Chronic obstructive pulmonary disease with (acute) exacerbation: Secondary | ICD-10-CM | POA: Diagnosis not present

## 2016-05-18 DIAGNOSIS — I5043 Acute on chronic combined systolic (congestive) and diastolic (congestive) heart failure: Secondary | ICD-10-CM | POA: Diagnosis not present

## 2016-05-18 DIAGNOSIS — J449 Chronic obstructive pulmonary disease, unspecified: Secondary | ICD-10-CM | POA: Diagnosis not present

## 2016-05-19 DIAGNOSIS — J9610 Chronic respiratory failure, unspecified whether with hypoxia or hypercapnia: Secondary | ICD-10-CM | POA: Diagnosis not present

## 2016-05-19 DIAGNOSIS — I5023 Acute on chronic systolic (congestive) heart failure: Secondary | ICD-10-CM | POA: Diagnosis not present

## 2016-05-19 DIAGNOSIS — N179 Acute kidney failure, unspecified: Secondary | ICD-10-CM

## 2016-05-19 DIAGNOSIS — R740 Nonspecific elevation of levels of transaminase and lactic acid dehydrogenase [LDH]: Secondary | ICD-10-CM

## 2016-05-19 DIAGNOSIS — J441 Chronic obstructive pulmonary disease with (acute) exacerbation: Secondary | ICD-10-CM | POA: Diagnosis not present

## 2016-05-19 DIAGNOSIS — I5043 Acute on chronic combined systolic (congestive) and diastolic (congestive) heart failure: Secondary | ICD-10-CM | POA: Diagnosis not present

## 2016-05-19 DIAGNOSIS — E119 Type 2 diabetes mellitus without complications: Secondary | ICD-10-CM | POA: Diagnosis not present

## 2016-05-20 DIAGNOSIS — E11649 Type 2 diabetes mellitus with hypoglycemia without coma: Secondary | ICD-10-CM | POA: Diagnosis not present

## 2016-05-20 DIAGNOSIS — R740 Nonspecific elevation of levels of transaminase and lactic acid dehydrogenase [LDH]: Secondary | ICD-10-CM | POA: Diagnosis not present

## 2016-05-20 DIAGNOSIS — D649 Anemia, unspecified: Secondary | ICD-10-CM | POA: Diagnosis not present

## 2016-05-20 DIAGNOSIS — R339 Retention of urine, unspecified: Secondary | ICD-10-CM | POA: Diagnosis not present

## 2016-05-20 DIAGNOSIS — J9600 Acute respiratory failure, unspecified whether with hypoxia or hypercapnia: Secondary | ICD-10-CM | POA: Diagnosis not present

## 2016-05-20 DIAGNOSIS — R69 Illness, unspecified: Secondary | ICD-10-CM | POA: Diagnosis not present

## 2016-05-20 DIAGNOSIS — I5043 Acute on chronic combined systolic (congestive) and diastolic (congestive) heart failure: Secondary | ICD-10-CM | POA: Diagnosis not present

## 2016-05-20 DIAGNOSIS — I5022 Chronic systolic (congestive) heart failure: Secondary | ICD-10-CM | POA: Diagnosis not present

## 2016-05-20 DIAGNOSIS — K219 Gastro-esophageal reflux disease without esophagitis: Secondary | ICD-10-CM | POA: Diagnosis not present

## 2016-05-20 DIAGNOSIS — J449 Chronic obstructive pulmonary disease, unspecified: Secondary | ICD-10-CM | POA: Diagnosis not present

## 2016-05-20 DIAGNOSIS — R0602 Shortness of breath: Secondary | ICD-10-CM | POA: Diagnosis not present

## 2016-05-20 DIAGNOSIS — I5023 Acute on chronic systolic (congestive) heart failure: Secondary | ICD-10-CM | POA: Diagnosis not present

## 2016-05-20 DIAGNOSIS — N179 Acute kidney failure, unspecified: Secondary | ICD-10-CM | POA: Diagnosis not present

## 2016-05-20 DIAGNOSIS — I110 Hypertensive heart disease with heart failure: Secondary | ICD-10-CM | POA: Diagnosis not present

## 2016-05-20 DIAGNOSIS — I5040 Unspecified combined systolic (congestive) and diastolic (congestive) heart failure: Secondary | ICD-10-CM | POA: Diagnosis not present

## 2016-05-20 DIAGNOSIS — R001 Bradycardia, unspecified: Secondary | ICD-10-CM | POA: Diagnosis not present

## 2016-05-20 DIAGNOSIS — N390 Urinary tract infection, site not specified: Secondary | ICD-10-CM | POA: Diagnosis not present

## 2016-05-20 DIAGNOSIS — I4891 Unspecified atrial fibrillation: Secondary | ICD-10-CM | POA: Diagnosis not present

## 2016-05-20 DIAGNOSIS — E875 Hyperkalemia: Secondary | ICD-10-CM | POA: Diagnosis not present

## 2016-05-20 DIAGNOSIS — E119 Type 2 diabetes mellitus without complications: Secondary | ICD-10-CM | POA: Diagnosis not present

## 2016-05-20 DIAGNOSIS — E871 Hypo-osmolality and hyponatremia: Secondary | ICD-10-CM | POA: Diagnosis not present

## 2016-05-20 DIAGNOSIS — J9610 Chronic respiratory failure, unspecified whether with hypoxia or hypercapnia: Secondary | ICD-10-CM | POA: Diagnosis not present

## 2016-05-20 DIAGNOSIS — J441 Chronic obstructive pulmonary disease with (acute) exacerbation: Secondary | ICD-10-CM | POA: Diagnosis not present

## 2016-05-20 DIAGNOSIS — I491 Atrial premature depolarization: Secondary | ICD-10-CM | POA: Diagnosis not present

## 2016-05-20 DIAGNOSIS — R9431 Abnormal electrocardiogram [ECG] [EKG]: Secondary | ICD-10-CM | POA: Diagnosis not present

## 2016-05-25 DIAGNOSIS — I5023 Acute on chronic systolic (congestive) heart failure: Secondary | ICD-10-CM | POA: Diagnosis not present

## 2016-05-25 DIAGNOSIS — E119 Type 2 diabetes mellitus without complications: Secondary | ICD-10-CM | POA: Diagnosis not present

## 2016-05-25 DIAGNOSIS — J9610 Chronic respiratory failure, unspecified whether with hypoxia or hypercapnia: Secondary | ICD-10-CM | POA: Diagnosis not present

## 2016-05-25 DIAGNOSIS — J441 Chronic obstructive pulmonary disease with (acute) exacerbation: Secondary | ICD-10-CM | POA: Diagnosis not present

## 2016-05-26 DIAGNOSIS — J9610 Chronic respiratory failure, unspecified whether with hypoxia or hypercapnia: Secondary | ICD-10-CM | POA: Diagnosis not present

## 2016-05-26 DIAGNOSIS — J441 Chronic obstructive pulmonary disease with (acute) exacerbation: Secondary | ICD-10-CM | POA: Diagnosis not present

## 2016-05-26 DIAGNOSIS — E119 Type 2 diabetes mellitus without complications: Secondary | ICD-10-CM | POA: Diagnosis not present

## 2016-05-26 DIAGNOSIS — K219 Gastro-esophageal reflux disease without esophagitis: Secondary | ICD-10-CM | POA: Diagnosis not present

## 2016-06-02 DIAGNOSIS — J9610 Chronic respiratory failure, unspecified whether with hypoxia or hypercapnia: Secondary | ICD-10-CM | POA: Diagnosis not present

## 2016-06-02 DIAGNOSIS — E119 Type 2 diabetes mellitus without complications: Secondary | ICD-10-CM | POA: Diagnosis not present

## 2016-06-02 DIAGNOSIS — I5022 Chronic systolic (congestive) heart failure: Secondary | ICD-10-CM | POA: Diagnosis not present

## 2016-06-02 DIAGNOSIS — J449 Chronic obstructive pulmonary disease, unspecified: Secondary | ICD-10-CM | POA: Diagnosis not present

## 2016-06-03 DIAGNOSIS — K219 Gastro-esophageal reflux disease without esophagitis: Secondary | ICD-10-CM | POA: Diagnosis not present

## 2016-06-03 DIAGNOSIS — I5022 Chronic systolic (congestive) heart failure: Secondary | ICD-10-CM | POA: Diagnosis not present

## 2016-06-03 DIAGNOSIS — E119 Type 2 diabetes mellitus without complications: Secondary | ICD-10-CM | POA: Diagnosis not present

## 2016-06-03 DIAGNOSIS — J449 Chronic obstructive pulmonary disease, unspecified: Secondary | ICD-10-CM | POA: Diagnosis not present

## 2016-06-12 DIAGNOSIS — E119 Type 2 diabetes mellitus without complications: Secondary | ICD-10-CM | POA: Diagnosis not present

## 2016-06-12 DIAGNOSIS — K219 Gastro-esophageal reflux disease without esophagitis: Secondary | ICD-10-CM | POA: Diagnosis not present

## 2016-06-12 DIAGNOSIS — J9610 Chronic respiratory failure, unspecified whether with hypoxia or hypercapnia: Secondary | ICD-10-CM | POA: Diagnosis not present

## 2016-06-12 DIAGNOSIS — J441 Chronic obstructive pulmonary disease with (acute) exacerbation: Secondary | ICD-10-CM | POA: Diagnosis not present

## 2016-06-12 DIAGNOSIS — N390 Urinary tract infection, site not specified: Secondary | ICD-10-CM | POA: Diagnosis not present

## 2016-06-12 DIAGNOSIS — I5023 Acute on chronic systolic (congestive) heart failure: Secondary | ICD-10-CM | POA: Diagnosis not present

## 2016-06-15 DIAGNOSIS — J9610 Chronic respiratory failure, unspecified whether with hypoxia or hypercapnia: Secondary | ICD-10-CM | POA: Diagnosis not present

## 2016-06-15 DIAGNOSIS — J449 Chronic obstructive pulmonary disease, unspecified: Secondary | ICD-10-CM | POA: Diagnosis not present

## 2016-06-15 DIAGNOSIS — J441 Chronic obstructive pulmonary disease with (acute) exacerbation: Secondary | ICD-10-CM | POA: Diagnosis not present

## 2016-06-15 DIAGNOSIS — E119 Type 2 diabetes mellitus without complications: Secondary | ICD-10-CM | POA: Diagnosis not present

## 2016-06-15 DIAGNOSIS — N390 Urinary tract infection, site not specified: Secondary | ICD-10-CM | POA: Diagnosis not present

## 2016-06-15 DIAGNOSIS — I5023 Acute on chronic systolic (congestive) heart failure: Secondary | ICD-10-CM | POA: Diagnosis not present

## 2016-06-16 DIAGNOSIS — I5023 Acute on chronic systolic (congestive) heart failure: Secondary | ICD-10-CM | POA: Diagnosis not present

## 2016-06-16 DIAGNOSIS — J9610 Chronic respiratory failure, unspecified whether with hypoxia or hypercapnia: Secondary | ICD-10-CM | POA: Diagnosis not present

## 2016-06-16 DIAGNOSIS — E119 Type 2 diabetes mellitus without complications: Secondary | ICD-10-CM | POA: Diagnosis not present

## 2016-06-16 DIAGNOSIS — N390 Urinary tract infection, site not specified: Secondary | ICD-10-CM | POA: Diagnosis not present

## 2016-06-16 DIAGNOSIS — J441 Chronic obstructive pulmonary disease with (acute) exacerbation: Secondary | ICD-10-CM | POA: Diagnosis not present

## 2016-06-17 DIAGNOSIS — I5023 Acute on chronic systolic (congestive) heart failure: Secondary | ICD-10-CM | POA: Diagnosis not present

## 2016-06-17 DIAGNOSIS — N390 Urinary tract infection, site not specified: Secondary | ICD-10-CM | POA: Diagnosis not present

## 2016-06-17 DIAGNOSIS — J441 Chronic obstructive pulmonary disease with (acute) exacerbation: Secondary | ICD-10-CM | POA: Diagnosis not present

## 2016-06-17 DIAGNOSIS — J9610 Chronic respiratory failure, unspecified whether with hypoxia or hypercapnia: Secondary | ICD-10-CM | POA: Diagnosis not present

## 2016-06-17 DIAGNOSIS — E119 Type 2 diabetes mellitus without complications: Secondary | ICD-10-CM | POA: Diagnosis not present

## 2016-06-18 DIAGNOSIS — E119 Type 2 diabetes mellitus without complications: Secondary | ICD-10-CM | POA: Diagnosis not present

## 2016-06-18 DIAGNOSIS — J9610 Chronic respiratory failure, unspecified whether with hypoxia or hypercapnia: Secondary | ICD-10-CM | POA: Diagnosis not present

## 2016-06-18 DIAGNOSIS — I5023 Acute on chronic systolic (congestive) heart failure: Secondary | ICD-10-CM | POA: Diagnosis not present

## 2016-06-18 DIAGNOSIS — N390 Urinary tract infection, site not specified: Secondary | ICD-10-CM | POA: Diagnosis not present

## 2016-06-18 DIAGNOSIS — J441 Chronic obstructive pulmonary disease with (acute) exacerbation: Secondary | ICD-10-CM | POA: Diagnosis not present

## 2016-06-23 DIAGNOSIS — E119 Type 2 diabetes mellitus without complications: Secondary | ICD-10-CM | POA: Diagnosis not present

## 2016-06-23 DIAGNOSIS — M2041 Other hammer toe(s) (acquired), right foot: Secondary | ICD-10-CM | POA: Diagnosis not present

## 2016-06-23 DIAGNOSIS — B351 Tinea unguium: Secondary | ICD-10-CM | POA: Diagnosis not present

## 2016-06-24 DIAGNOSIS — I10 Essential (primary) hypertension: Secondary | ICD-10-CM | POA: Diagnosis not present

## 2016-06-24 DIAGNOSIS — I5022 Chronic systolic (congestive) heart failure: Secondary | ICD-10-CM | POA: Diagnosis not present

## 2016-06-24 DIAGNOSIS — E119 Type 2 diabetes mellitus without complications: Secondary | ICD-10-CM | POA: Diagnosis not present

## 2016-06-24 DIAGNOSIS — I5023 Acute on chronic systolic (congestive) heart failure: Secondary | ICD-10-CM | POA: Diagnosis not present

## 2016-06-24 DIAGNOSIS — E785 Hyperlipidemia, unspecified: Secondary | ICD-10-CM | POA: Diagnosis not present

## 2016-06-24 DIAGNOSIS — E1149 Type 2 diabetes mellitus with other diabetic neurological complication: Secondary | ICD-10-CM | POA: Diagnosis not present

## 2016-06-24 DIAGNOSIS — N390 Urinary tract infection, site not specified: Secondary | ICD-10-CM | POA: Diagnosis not present

## 2016-06-24 DIAGNOSIS — K219 Gastro-esophageal reflux disease without esophagitis: Secondary | ICD-10-CM | POA: Diagnosis not present

## 2016-06-24 DIAGNOSIS — E039 Hypothyroidism, unspecified: Secondary | ICD-10-CM | POA: Diagnosis not present

## 2016-06-24 DIAGNOSIS — J441 Chronic obstructive pulmonary disease with (acute) exacerbation: Secondary | ICD-10-CM | POA: Diagnosis not present

## 2016-06-24 DIAGNOSIS — D510 Vitamin B12 deficiency anemia due to intrinsic factor deficiency: Secondary | ICD-10-CM | POA: Diagnosis not present

## 2016-06-24 DIAGNOSIS — R69 Illness, unspecified: Secondary | ICD-10-CM | POA: Diagnosis not present

## 2016-06-24 DIAGNOSIS — E538 Deficiency of other specified B group vitamins: Secondary | ICD-10-CM | POA: Diagnosis not present

## 2016-06-24 DIAGNOSIS — J9610 Chronic respiratory failure, unspecified whether with hypoxia or hypercapnia: Secondary | ICD-10-CM | POA: Diagnosis not present

## 2016-06-24 DIAGNOSIS — J449 Chronic obstructive pulmonary disease, unspecified: Secondary | ICD-10-CM | POA: Diagnosis not present

## 2016-06-25 DIAGNOSIS — J441 Chronic obstructive pulmonary disease with (acute) exacerbation: Secondary | ICD-10-CM | POA: Diagnosis not present

## 2016-06-25 DIAGNOSIS — J9610 Chronic respiratory failure, unspecified whether with hypoxia or hypercapnia: Secondary | ICD-10-CM | POA: Diagnosis not present

## 2016-06-25 DIAGNOSIS — N390 Urinary tract infection, site not specified: Secondary | ICD-10-CM | POA: Diagnosis not present

## 2016-06-25 DIAGNOSIS — I5023 Acute on chronic systolic (congestive) heart failure: Secondary | ICD-10-CM | POA: Diagnosis not present

## 2016-06-25 DIAGNOSIS — E119 Type 2 diabetes mellitus without complications: Secondary | ICD-10-CM | POA: Diagnosis not present

## 2016-06-26 DIAGNOSIS — J9610 Chronic respiratory failure, unspecified whether with hypoxia or hypercapnia: Secondary | ICD-10-CM | POA: Diagnosis not present

## 2016-06-26 DIAGNOSIS — J441 Chronic obstructive pulmonary disease with (acute) exacerbation: Secondary | ICD-10-CM | POA: Diagnosis not present

## 2016-06-26 DIAGNOSIS — I5023 Acute on chronic systolic (congestive) heart failure: Secondary | ICD-10-CM | POA: Diagnosis not present

## 2016-06-26 DIAGNOSIS — N390 Urinary tract infection, site not specified: Secondary | ICD-10-CM | POA: Diagnosis not present

## 2016-06-26 DIAGNOSIS — E119 Type 2 diabetes mellitus without complications: Secondary | ICD-10-CM | POA: Diagnosis not present

## 2016-06-30 DIAGNOSIS — N390 Urinary tract infection, site not specified: Secondary | ICD-10-CM | POA: Diagnosis not present

## 2016-06-30 DIAGNOSIS — J441 Chronic obstructive pulmonary disease with (acute) exacerbation: Secondary | ICD-10-CM | POA: Diagnosis not present

## 2016-06-30 DIAGNOSIS — I5023 Acute on chronic systolic (congestive) heart failure: Secondary | ICD-10-CM | POA: Diagnosis not present

## 2016-06-30 DIAGNOSIS — J9610 Chronic respiratory failure, unspecified whether with hypoxia or hypercapnia: Secondary | ICD-10-CM | POA: Diagnosis not present

## 2016-06-30 DIAGNOSIS — E119 Type 2 diabetes mellitus without complications: Secondary | ICD-10-CM | POA: Diagnosis not present

## 2016-07-01 DIAGNOSIS — J441 Chronic obstructive pulmonary disease with (acute) exacerbation: Secondary | ICD-10-CM | POA: Diagnosis not present

## 2016-07-01 DIAGNOSIS — N390 Urinary tract infection, site not specified: Secondary | ICD-10-CM | POA: Diagnosis not present

## 2016-07-01 DIAGNOSIS — J9610 Chronic respiratory failure, unspecified whether with hypoxia or hypercapnia: Secondary | ICD-10-CM | POA: Diagnosis not present

## 2016-07-01 DIAGNOSIS — E119 Type 2 diabetes mellitus without complications: Secondary | ICD-10-CM | POA: Diagnosis not present

## 2016-07-01 DIAGNOSIS — I5023 Acute on chronic systolic (congestive) heart failure: Secondary | ICD-10-CM | POA: Diagnosis not present

## 2016-07-06 DIAGNOSIS — E119 Type 2 diabetes mellitus without complications: Secondary | ICD-10-CM | POA: Diagnosis not present

## 2016-07-06 DIAGNOSIS — N390 Urinary tract infection, site not specified: Secondary | ICD-10-CM | POA: Diagnosis not present

## 2016-07-06 DIAGNOSIS — J9610 Chronic respiratory failure, unspecified whether with hypoxia or hypercapnia: Secondary | ICD-10-CM | POA: Diagnosis not present

## 2016-07-06 DIAGNOSIS — J441 Chronic obstructive pulmonary disease with (acute) exacerbation: Secondary | ICD-10-CM | POA: Diagnosis not present

## 2016-07-06 DIAGNOSIS — I5023 Acute on chronic systolic (congestive) heart failure: Secondary | ICD-10-CM | POA: Diagnosis not present

## 2016-07-07 DIAGNOSIS — I110 Hypertensive heart disease with heart failure: Secondary | ICD-10-CM | POA: Diagnosis not present

## 2016-07-07 DIAGNOSIS — E785 Hyperlipidemia, unspecified: Secondary | ICD-10-CM | POA: Diagnosis not present

## 2016-07-07 DIAGNOSIS — J449 Chronic obstructive pulmonary disease, unspecified: Secondary | ICD-10-CM | POA: Diagnosis not present

## 2016-07-07 DIAGNOSIS — I429 Cardiomyopathy, unspecified: Secondary | ICD-10-CM | POA: Diagnosis not present

## 2016-07-09 DIAGNOSIS — N390 Urinary tract infection, site not specified: Secondary | ICD-10-CM | POA: Diagnosis not present

## 2016-07-09 DIAGNOSIS — J441 Chronic obstructive pulmonary disease with (acute) exacerbation: Secondary | ICD-10-CM | POA: Diagnosis not present

## 2016-07-09 DIAGNOSIS — J9610 Chronic respiratory failure, unspecified whether with hypoxia or hypercapnia: Secondary | ICD-10-CM | POA: Diagnosis not present

## 2016-07-09 DIAGNOSIS — E119 Type 2 diabetes mellitus without complications: Secondary | ICD-10-CM | POA: Diagnosis not present

## 2016-07-09 DIAGNOSIS — I5023 Acute on chronic systolic (congestive) heart failure: Secondary | ICD-10-CM | POA: Diagnosis not present

## 2016-07-13 DIAGNOSIS — I5023 Acute on chronic systolic (congestive) heart failure: Secondary | ICD-10-CM | POA: Diagnosis not present

## 2016-07-13 DIAGNOSIS — J441 Chronic obstructive pulmonary disease with (acute) exacerbation: Secondary | ICD-10-CM | POA: Diagnosis not present

## 2016-07-13 DIAGNOSIS — N390 Urinary tract infection, site not specified: Secondary | ICD-10-CM | POA: Diagnosis not present

## 2016-07-13 DIAGNOSIS — J449 Chronic obstructive pulmonary disease, unspecified: Secondary | ICD-10-CM | POA: Diagnosis not present

## 2016-07-13 DIAGNOSIS — E119 Type 2 diabetes mellitus without complications: Secondary | ICD-10-CM | POA: Diagnosis not present

## 2016-07-13 DIAGNOSIS — J9610 Chronic respiratory failure, unspecified whether with hypoxia or hypercapnia: Secondary | ICD-10-CM | POA: Diagnosis not present

## 2016-07-14 DIAGNOSIS — J441 Chronic obstructive pulmonary disease with (acute) exacerbation: Secondary | ICD-10-CM | POA: Diagnosis not present

## 2016-07-14 DIAGNOSIS — E119 Type 2 diabetes mellitus without complications: Secondary | ICD-10-CM | POA: Diagnosis not present

## 2016-07-14 DIAGNOSIS — J9610 Chronic respiratory failure, unspecified whether with hypoxia or hypercapnia: Secondary | ICD-10-CM | POA: Diagnosis not present

## 2016-07-14 DIAGNOSIS — I5023 Acute on chronic systolic (congestive) heart failure: Secondary | ICD-10-CM | POA: Diagnosis not present

## 2016-07-14 DIAGNOSIS — N390 Urinary tract infection, site not specified: Secondary | ICD-10-CM | POA: Diagnosis not present

## 2016-07-16 DIAGNOSIS — J449 Chronic obstructive pulmonary disease, unspecified: Secondary | ICD-10-CM | POA: Diagnosis not present

## 2016-07-18 DIAGNOSIS — N390 Urinary tract infection, site not specified: Secondary | ICD-10-CM | POA: Diagnosis not present

## 2016-07-18 DIAGNOSIS — E119 Type 2 diabetes mellitus without complications: Secondary | ICD-10-CM | POA: Diagnosis not present

## 2016-07-18 DIAGNOSIS — I5023 Acute on chronic systolic (congestive) heart failure: Secondary | ICD-10-CM | POA: Diagnosis not present

## 2016-07-18 DIAGNOSIS — J441 Chronic obstructive pulmonary disease with (acute) exacerbation: Secondary | ICD-10-CM | POA: Diagnosis not present

## 2016-07-18 DIAGNOSIS — J9610 Chronic respiratory failure, unspecified whether with hypoxia or hypercapnia: Secondary | ICD-10-CM | POA: Diagnosis not present

## 2016-07-21 DIAGNOSIS — J9610 Chronic respiratory failure, unspecified whether with hypoxia or hypercapnia: Secondary | ICD-10-CM | POA: Diagnosis not present

## 2016-07-21 DIAGNOSIS — J441 Chronic obstructive pulmonary disease with (acute) exacerbation: Secondary | ICD-10-CM | POA: Diagnosis not present

## 2016-07-21 DIAGNOSIS — I5023 Acute on chronic systolic (congestive) heart failure: Secondary | ICD-10-CM | POA: Diagnosis not present

## 2016-07-21 DIAGNOSIS — E119 Type 2 diabetes mellitus without complications: Secondary | ICD-10-CM | POA: Diagnosis not present

## 2016-07-21 DIAGNOSIS — N390 Urinary tract infection, site not specified: Secondary | ICD-10-CM | POA: Diagnosis not present

## 2016-07-24 DIAGNOSIS — R413 Other amnesia: Secondary | ICD-10-CM | POA: Diagnosis not present

## 2016-07-24 DIAGNOSIS — E785 Hyperlipidemia, unspecified: Secondary | ICD-10-CM | POA: Diagnosis not present

## 2016-07-24 DIAGNOSIS — E039 Hypothyroidism, unspecified: Secondary | ICD-10-CM | POA: Diagnosis not present

## 2016-07-24 DIAGNOSIS — I10 Essential (primary) hypertension: Secondary | ICD-10-CM | POA: Diagnosis not present

## 2016-07-24 DIAGNOSIS — E538 Deficiency of other specified B group vitamins: Secondary | ICD-10-CM | POA: Diagnosis not present

## 2016-07-24 DIAGNOSIS — J449 Chronic obstructive pulmonary disease, unspecified: Secondary | ICD-10-CM | POA: Diagnosis not present

## 2016-07-24 DIAGNOSIS — E1149 Type 2 diabetes mellitus with other diabetic neurological complication: Secondary | ICD-10-CM | POA: Diagnosis not present

## 2016-07-24 DIAGNOSIS — D510 Vitamin B12 deficiency anemia due to intrinsic factor deficiency: Secondary | ICD-10-CM | POA: Diagnosis not present

## 2016-07-24 DIAGNOSIS — I5022 Chronic systolic (congestive) heart failure: Secondary | ICD-10-CM | POA: Diagnosis not present

## 2016-07-24 DIAGNOSIS — R51 Headache: Secondary | ICD-10-CM | POA: Diagnosis not present

## 2016-07-27 DIAGNOSIS — N390 Urinary tract infection, site not specified: Secondary | ICD-10-CM | POA: Diagnosis not present

## 2016-07-27 DIAGNOSIS — I5023 Acute on chronic systolic (congestive) heart failure: Secondary | ICD-10-CM | POA: Diagnosis not present

## 2016-07-27 DIAGNOSIS — J441 Chronic obstructive pulmonary disease with (acute) exacerbation: Secondary | ICD-10-CM | POA: Diagnosis not present

## 2016-07-27 DIAGNOSIS — J9610 Chronic respiratory failure, unspecified whether with hypoxia or hypercapnia: Secondary | ICD-10-CM | POA: Diagnosis not present

## 2016-07-27 DIAGNOSIS — E119 Type 2 diabetes mellitus without complications: Secondary | ICD-10-CM | POA: Diagnosis not present

## 2016-07-28 DIAGNOSIS — Z1231 Encounter for screening mammogram for malignant neoplasm of breast: Secondary | ICD-10-CM | POA: Diagnosis not present

## 2016-07-29 DIAGNOSIS — S0990XA Unspecified injury of head, initial encounter: Secondary | ICD-10-CM | POA: Diagnosis not present

## 2016-07-29 DIAGNOSIS — R296 Repeated falls: Secondary | ICD-10-CM | POA: Diagnosis not present

## 2016-07-29 DIAGNOSIS — R413 Other amnesia: Secondary | ICD-10-CM | POA: Diagnosis not present

## 2016-08-10 DIAGNOSIS — Z0000 Encounter for general adult medical examination without abnormal findings: Secondary | ICD-10-CM | POA: Diagnosis not present

## 2016-08-10 DIAGNOSIS — H9113 Presbycusis, bilateral: Secondary | ICD-10-CM | POA: Diagnosis not present

## 2016-08-10 DIAGNOSIS — Z7951 Long term (current) use of inhaled steroids: Secondary | ICD-10-CM | POA: Diagnosis not present

## 2016-08-10 DIAGNOSIS — R52 Pain, unspecified: Secondary | ICD-10-CM | POA: Diagnosis not present

## 2016-08-10 DIAGNOSIS — K08409 Partial loss of teeth, unspecified cause, unspecified class: Secondary | ICD-10-CM | POA: Diagnosis not present

## 2016-08-10 DIAGNOSIS — Z7984 Long term (current) use of oral hypoglycemic drugs: Secondary | ICD-10-CM | POA: Diagnosis not present

## 2016-08-10 DIAGNOSIS — R609 Edema, unspecified: Secondary | ICD-10-CM | POA: Diagnosis not present

## 2016-08-10 DIAGNOSIS — J449 Chronic obstructive pulmonary disease, unspecified: Secondary | ICD-10-CM | POA: Diagnosis not present

## 2016-08-10 DIAGNOSIS — J302 Other seasonal allergic rhinitis: Secondary | ICD-10-CM | POA: Diagnosis not present

## 2016-08-10 DIAGNOSIS — Z9181 History of falling: Secondary | ICD-10-CM | POA: Diagnosis not present

## 2016-08-10 DIAGNOSIS — E1149 Type 2 diabetes mellitus with other diabetic neurological complication: Secondary | ICD-10-CM | POA: Diagnosis not present

## 2016-08-10 DIAGNOSIS — Z87891 Personal history of nicotine dependence: Secondary | ICD-10-CM | POA: Diagnosis not present

## 2016-08-10 DIAGNOSIS — E039 Hypothyroidism, unspecified: Secondary | ICD-10-CM | POA: Diagnosis not present

## 2016-08-10 DIAGNOSIS — Z6822 Body mass index (BMI) 22.0-22.9, adult: Secondary | ICD-10-CM | POA: Diagnosis not present

## 2016-08-10 DIAGNOSIS — Z87448 Personal history of other diseases of urinary system: Secondary | ICD-10-CM | POA: Diagnosis not present

## 2016-08-10 DIAGNOSIS — Z7982 Long term (current) use of aspirin: Secondary | ICD-10-CM | POA: Diagnosis not present

## 2016-08-10 DIAGNOSIS — E7800 Pure hypercholesterolemia, unspecified: Secondary | ICD-10-CM | POA: Diagnosis not present

## 2016-08-10 DIAGNOSIS — R69 Illness, unspecified: Secondary | ICD-10-CM | POA: Diagnosis not present

## 2016-08-10 DIAGNOSIS — G3184 Mild cognitive impairment, so stated: Secondary | ICD-10-CM | POA: Diagnosis not present

## 2016-08-13 DIAGNOSIS — J449 Chronic obstructive pulmonary disease, unspecified: Secondary | ICD-10-CM | POA: Diagnosis not present

## 2016-08-15 DIAGNOSIS — E119 Type 2 diabetes mellitus without complications: Secondary | ICD-10-CM | POA: Diagnosis not present

## 2016-08-16 DIAGNOSIS — J449 Chronic obstructive pulmonary disease, unspecified: Secondary | ICD-10-CM | POA: Diagnosis not present

## 2016-09-03 DIAGNOSIS — J441 Chronic obstructive pulmonary disease with (acute) exacerbation: Secondary | ICD-10-CM | POA: Diagnosis not present

## 2016-09-03 DIAGNOSIS — E538 Deficiency of other specified B group vitamins: Secondary | ICD-10-CM | POA: Diagnosis not present

## 2016-09-03 DIAGNOSIS — D510 Vitamin B12 deficiency anemia due to intrinsic factor deficiency: Secondary | ICD-10-CM | POA: Diagnosis not present

## 2016-09-10 DIAGNOSIS — J449 Chronic obstructive pulmonary disease, unspecified: Secondary | ICD-10-CM | POA: Diagnosis not present

## 2016-09-13 DIAGNOSIS — J449 Chronic obstructive pulmonary disease, unspecified: Secondary | ICD-10-CM | POA: Diagnosis not present

## 2016-10-11 DIAGNOSIS — J449 Chronic obstructive pulmonary disease, unspecified: Secondary | ICD-10-CM | POA: Diagnosis not present

## 2016-10-14 DIAGNOSIS — J449 Chronic obstructive pulmonary disease, unspecified: Secondary | ICD-10-CM | POA: Diagnosis not present

## 2016-10-31 DIAGNOSIS — R51 Headache: Secondary | ICD-10-CM | POA: Diagnosis not present

## 2016-10-31 DIAGNOSIS — S79911A Unspecified injury of right hip, initial encounter: Secondary | ICD-10-CM | POA: Diagnosis not present

## 2016-10-31 DIAGNOSIS — R220 Localized swelling, mass and lump, head: Secondary | ICD-10-CM | POA: Diagnosis not present

## 2016-10-31 DIAGNOSIS — S0990XA Unspecified injury of head, initial encounter: Secondary | ICD-10-CM | POA: Diagnosis not present

## 2016-10-31 DIAGNOSIS — S42302A Unspecified fracture of shaft of humerus, left arm, initial encounter for closed fracture: Secondary | ICD-10-CM | POA: Diagnosis not present

## 2016-10-31 DIAGNOSIS — M25422 Effusion, left elbow: Secondary | ICD-10-CM | POA: Diagnosis not present

## 2016-10-31 DIAGNOSIS — W109XXA Fall (on) (from) unspecified stairs and steps, initial encounter: Secondary | ICD-10-CM | POA: Diagnosis not present

## 2016-10-31 DIAGNOSIS — S060X0A Concussion without loss of consciousness, initial encounter: Secondary | ICD-10-CM | POA: Diagnosis not present

## 2016-10-31 DIAGNOSIS — S52022A Displaced fracture of olecranon process without intraarticular extension of left ulna, initial encounter for closed fracture: Secondary | ICD-10-CM | POA: Diagnosis not present

## 2016-10-31 DIAGNOSIS — M25551 Pain in right hip: Secondary | ICD-10-CM | POA: Diagnosis not present

## 2016-10-31 DIAGNOSIS — S50312A Abrasion of left elbow, initial encounter: Secondary | ICD-10-CM | POA: Diagnosis not present

## 2016-11-01 DIAGNOSIS — S52022A Displaced fracture of olecranon process without intraarticular extension of left ulna, initial encounter for closed fracture: Secondary | ICD-10-CM | POA: Diagnosis not present

## 2016-11-01 DIAGNOSIS — S060X0A Concussion without loss of consciousness, initial encounter: Secondary | ICD-10-CM | POA: Diagnosis not present

## 2016-11-01 DIAGNOSIS — S79911A Unspecified injury of right hip, initial encounter: Secondary | ICD-10-CM | POA: Diagnosis not present

## 2016-11-01 DIAGNOSIS — S50312A Abrasion of left elbow, initial encounter: Secondary | ICD-10-CM | POA: Diagnosis not present

## 2016-11-01 DIAGNOSIS — M25551 Pain in right hip: Secondary | ICD-10-CM | POA: Diagnosis not present

## 2016-11-09 DIAGNOSIS — Z6824 Body mass index (BMI) 24.0-24.9, adult: Secondary | ICD-10-CM | POA: Diagnosis not present

## 2016-11-09 DIAGNOSIS — S52022D Displaced fracture of olecranon process without intraarticular extension of left ulna, subsequent encounter for closed fracture with routine healing: Secondary | ICD-10-CM | POA: Diagnosis not present

## 2016-11-09 DIAGNOSIS — R29898 Other symptoms and signs involving the musculoskeletal system: Secondary | ICD-10-CM | POA: Diagnosis not present

## 2016-11-09 DIAGNOSIS — R269 Unspecified abnormalities of gait and mobility: Secondary | ICD-10-CM | POA: Diagnosis not present

## 2016-11-09 DIAGNOSIS — R296 Repeated falls: Secondary | ICD-10-CM | POA: Diagnosis not present

## 2016-11-10 DIAGNOSIS — J449 Chronic obstructive pulmonary disease, unspecified: Secondary | ICD-10-CM | POA: Diagnosis not present

## 2016-11-11 DIAGNOSIS — R69 Illness, unspecified: Secondary | ICD-10-CM | POA: Diagnosis not present

## 2016-11-11 DIAGNOSIS — I110 Hypertensive heart disease with heart failure: Secondary | ICD-10-CM | POA: Diagnosis not present

## 2016-11-11 DIAGNOSIS — R296 Repeated falls: Secondary | ICD-10-CM | POA: Diagnosis not present

## 2016-11-11 DIAGNOSIS — S52022D Displaced fracture of olecranon process without intraarticular extension of left ulna, subsequent encounter for closed fracture with routine healing: Secondary | ICD-10-CM | POA: Diagnosis not present

## 2016-11-11 DIAGNOSIS — J449 Chronic obstructive pulmonary disease, unspecified: Secondary | ICD-10-CM | POA: Diagnosis not present

## 2016-11-11 DIAGNOSIS — M25551 Pain in right hip: Secondary | ICD-10-CM | POA: Diagnosis not present

## 2016-11-11 DIAGNOSIS — E119 Type 2 diabetes mellitus without complications: Secondary | ICD-10-CM | POA: Diagnosis not present

## 2016-11-11 DIAGNOSIS — I5020 Unspecified systolic (congestive) heart failure: Secondary | ICD-10-CM | POA: Diagnosis not present

## 2016-11-11 DIAGNOSIS — S060X0D Concussion without loss of consciousness, subsequent encounter: Secondary | ICD-10-CM | POA: Diagnosis not present

## 2016-11-13 DIAGNOSIS — J449 Chronic obstructive pulmonary disease, unspecified: Secondary | ICD-10-CM | POA: Diagnosis not present

## 2016-11-13 DIAGNOSIS — M6281 Muscle weakness (generalized): Secondary | ICD-10-CM | POA: Diagnosis not present

## 2016-11-13 DIAGNOSIS — S060X0D Concussion without loss of consciousness, subsequent encounter: Secondary | ICD-10-CM | POA: Diagnosis not present

## 2016-11-13 DIAGNOSIS — R69 Illness, unspecified: Secondary | ICD-10-CM | POA: Diagnosis not present

## 2016-11-13 DIAGNOSIS — I5022 Chronic systolic (congestive) heart failure: Secondary | ICD-10-CM | POA: Diagnosis not present

## 2016-11-13 DIAGNOSIS — R296 Repeated falls: Secondary | ICD-10-CM | POA: Diagnosis not present

## 2016-11-13 DIAGNOSIS — E119 Type 2 diabetes mellitus without complications: Secondary | ICD-10-CM | POA: Diagnosis not present

## 2016-11-13 DIAGNOSIS — I5023 Acute on chronic systolic (congestive) heart failure: Secondary | ICD-10-CM | POA: Diagnosis not present

## 2016-11-13 DIAGNOSIS — M25551 Pain in right hip: Secondary | ICD-10-CM | POA: Diagnosis not present

## 2016-11-13 DIAGNOSIS — J9610 Chronic respiratory failure, unspecified whether with hypoxia or hypercapnia: Secondary | ICD-10-CM | POA: Diagnosis not present

## 2016-11-13 DIAGNOSIS — S52022D Displaced fracture of olecranon process without intraarticular extension of left ulna, subsequent encounter for closed fracture with routine healing: Secondary | ICD-10-CM | POA: Diagnosis not present

## 2016-11-13 DIAGNOSIS — R489 Unspecified symbolic dysfunctions: Secondary | ICD-10-CM | POA: Diagnosis not present

## 2016-11-13 DIAGNOSIS — S52022S Displaced fracture of olecranon process without intraarticular extension of left ulna, sequela: Secondary | ICD-10-CM | POA: Diagnosis not present

## 2016-11-13 DIAGNOSIS — R262 Difficulty in walking, not elsewhere classified: Secondary | ICD-10-CM | POA: Diagnosis not present

## 2016-11-13 DIAGNOSIS — N390 Urinary tract infection, site not specified: Secondary | ICD-10-CM | POA: Diagnosis not present

## 2016-11-13 DIAGNOSIS — I110 Hypertensive heart disease with heart failure: Secondary | ICD-10-CM | POA: Diagnosis not present

## 2016-11-13 DIAGNOSIS — N179 Acute kidney failure, unspecified: Secondary | ICD-10-CM | POA: Diagnosis not present

## 2016-11-13 DIAGNOSIS — M25561 Pain in right knee: Secondary | ICD-10-CM | POA: Diagnosis not present

## 2016-11-13 DIAGNOSIS — I5020 Unspecified systolic (congestive) heart failure: Secondary | ICD-10-CM | POA: Diagnosis not present

## 2016-11-16 DIAGNOSIS — J449 Chronic obstructive pulmonary disease, unspecified: Secondary | ICD-10-CM | POA: Diagnosis not present

## 2016-11-16 DIAGNOSIS — I5022 Chronic systolic (congestive) heart failure: Secondary | ICD-10-CM | POA: Diagnosis not present

## 2016-11-16 DIAGNOSIS — S52022D Displaced fracture of olecranon process without intraarticular extension of left ulna, subsequent encounter for closed fracture with routine healing: Secondary | ICD-10-CM | POA: Diagnosis not present

## 2016-11-16 DIAGNOSIS — J9610 Chronic respiratory failure, unspecified whether with hypoxia or hypercapnia: Secondary | ICD-10-CM | POA: Diagnosis not present

## 2016-11-17 DIAGNOSIS — S52022S Displaced fracture of olecranon process without intraarticular extension of left ulna, sequela: Secondary | ICD-10-CM | POA: Diagnosis not present

## 2016-11-25 DIAGNOSIS — J449 Chronic obstructive pulmonary disease, unspecified: Secondary | ICD-10-CM | POA: Diagnosis not present

## 2016-11-25 DIAGNOSIS — I5022 Chronic systolic (congestive) heart failure: Secondary | ICD-10-CM | POA: Diagnosis not present

## 2016-11-25 DIAGNOSIS — J9610 Chronic respiratory failure, unspecified whether with hypoxia or hypercapnia: Secondary | ICD-10-CM | POA: Diagnosis not present

## 2016-11-25 DIAGNOSIS — E119 Type 2 diabetes mellitus without complications: Secondary | ICD-10-CM | POA: Diagnosis not present

## 2016-11-30 DIAGNOSIS — E119 Type 2 diabetes mellitus without complications: Secondary | ICD-10-CM | POA: Diagnosis not present

## 2016-11-30 DIAGNOSIS — I5022 Chronic systolic (congestive) heart failure: Secondary | ICD-10-CM | POA: Diagnosis not present

## 2016-11-30 DIAGNOSIS — J9610 Chronic respiratory failure, unspecified whether with hypoxia or hypercapnia: Secondary | ICD-10-CM | POA: Diagnosis not present

## 2016-11-30 DIAGNOSIS — J449 Chronic obstructive pulmonary disease, unspecified: Secondary | ICD-10-CM | POA: Diagnosis not present

## 2016-12-07 DIAGNOSIS — J9610 Chronic respiratory failure, unspecified whether with hypoxia or hypercapnia: Secondary | ICD-10-CM | POA: Diagnosis not present

## 2016-12-07 DIAGNOSIS — E119 Type 2 diabetes mellitus without complications: Secondary | ICD-10-CM | POA: Diagnosis not present

## 2016-12-07 DIAGNOSIS — I5022 Chronic systolic (congestive) heart failure: Secondary | ICD-10-CM | POA: Diagnosis not present

## 2016-12-07 DIAGNOSIS — J449 Chronic obstructive pulmonary disease, unspecified: Secondary | ICD-10-CM | POA: Diagnosis not present

## 2016-12-09 DIAGNOSIS — I071 Rheumatic tricuspid insufficiency: Secondary | ICD-10-CM | POA: Diagnosis not present

## 2016-12-09 DIAGNOSIS — M6281 Muscle weakness (generalized): Secondary | ICD-10-CM | POA: Diagnosis not present

## 2016-12-09 DIAGNOSIS — J449 Chronic obstructive pulmonary disease, unspecified: Secondary | ICD-10-CM | POA: Diagnosis not present

## 2016-12-09 DIAGNOSIS — M159 Polyosteoarthritis, unspecified: Secondary | ICD-10-CM | POA: Diagnosis not present

## 2016-12-09 DIAGNOSIS — D510 Vitamin B12 deficiency anemia due to intrinsic factor deficiency: Secondary | ICD-10-CM | POA: Diagnosis not present

## 2016-12-09 DIAGNOSIS — S52022D Displaced fracture of olecranon process without intraarticular extension of left ulna, subsequent encounter for closed fracture with routine healing: Secondary | ICD-10-CM | POA: Diagnosis not present

## 2016-12-09 DIAGNOSIS — R69 Illness, unspecified: Secondary | ICD-10-CM | POA: Diagnosis not present

## 2016-12-09 DIAGNOSIS — N3281 Overactive bladder: Secondary | ICD-10-CM | POA: Diagnosis not present

## 2016-12-09 DIAGNOSIS — I5022 Chronic systolic (congestive) heart failure: Secondary | ICD-10-CM | POA: Diagnosis not present

## 2016-12-10 DIAGNOSIS — F419 Anxiety disorder, unspecified: Secondary | ICD-10-CM | POA: Diagnosis not present

## 2016-12-10 DIAGNOSIS — R69 Illness, unspecified: Secondary | ICD-10-CM | POA: Diagnosis not present

## 2016-12-10 DIAGNOSIS — Z658 Other specified problems related to psychosocial circumstances: Secondary | ICD-10-CM | POA: Diagnosis not present

## 2016-12-11 DIAGNOSIS — J449 Chronic obstructive pulmonary disease, unspecified: Secondary | ICD-10-CM | POA: Diagnosis not present

## 2016-12-14 DIAGNOSIS — M6281 Muscle weakness (generalized): Secondary | ICD-10-CM | POA: Diagnosis not present

## 2016-12-14 DIAGNOSIS — R69 Illness, unspecified: Secondary | ICD-10-CM | POA: Diagnosis not present

## 2016-12-14 DIAGNOSIS — D510 Vitamin B12 deficiency anemia due to intrinsic factor deficiency: Secondary | ICD-10-CM | POA: Diagnosis not present

## 2016-12-14 DIAGNOSIS — J449 Chronic obstructive pulmonary disease, unspecified: Secondary | ICD-10-CM | POA: Diagnosis not present

## 2016-12-14 DIAGNOSIS — I5022 Chronic systolic (congestive) heart failure: Secondary | ICD-10-CM | POA: Diagnosis not present

## 2016-12-14 DIAGNOSIS — S52022D Displaced fracture of olecranon process without intraarticular extension of left ulna, subsequent encounter for closed fracture with routine healing: Secondary | ICD-10-CM | POA: Diagnosis not present

## 2016-12-14 DIAGNOSIS — I071 Rheumatic tricuspid insufficiency: Secondary | ICD-10-CM | POA: Diagnosis not present

## 2016-12-14 DIAGNOSIS — N3281 Overactive bladder: Secondary | ICD-10-CM | POA: Diagnosis not present

## 2016-12-14 DIAGNOSIS — M159 Polyosteoarthritis, unspecified: Secondary | ICD-10-CM | POA: Diagnosis not present

## 2016-12-16 DIAGNOSIS — E782 Mixed hyperlipidemia: Secondary | ICD-10-CM | POA: Diagnosis not present

## 2016-12-16 DIAGNOSIS — S52022S Displaced fracture of olecranon process without intraarticular extension of left ulna, sequela: Secondary | ICD-10-CM | POA: Diagnosis not present

## 2016-12-16 DIAGNOSIS — S52022D Displaced fracture of olecranon process without intraarticular extension of left ulna, subsequent encounter for closed fracture with routine healing: Secondary | ICD-10-CM | POA: Diagnosis not present

## 2016-12-16 DIAGNOSIS — J449 Chronic obstructive pulmonary disease, unspecified: Secondary | ICD-10-CM | POA: Diagnosis not present

## 2016-12-16 DIAGNOSIS — I071 Rheumatic tricuspid insufficiency: Secondary | ICD-10-CM | POA: Diagnosis not present

## 2016-12-16 DIAGNOSIS — Z79899 Other long term (current) drug therapy: Secondary | ICD-10-CM | POA: Diagnosis not present

## 2016-12-16 DIAGNOSIS — M159 Polyosteoarthritis, unspecified: Secondary | ICD-10-CM | POA: Diagnosis not present

## 2016-12-16 DIAGNOSIS — D518 Other vitamin B12 deficiency anemias: Secondary | ICD-10-CM | POA: Diagnosis not present

## 2016-12-16 DIAGNOSIS — E559 Vitamin D deficiency, unspecified: Secondary | ICD-10-CM | POA: Diagnosis not present

## 2016-12-16 DIAGNOSIS — N3281 Overactive bladder: Secondary | ICD-10-CM | POA: Diagnosis not present

## 2016-12-16 DIAGNOSIS — I5022 Chronic systolic (congestive) heart failure: Secondary | ICD-10-CM | POA: Diagnosis not present

## 2016-12-16 DIAGNOSIS — E119 Type 2 diabetes mellitus without complications: Secondary | ICD-10-CM | POA: Diagnosis not present

## 2016-12-16 DIAGNOSIS — M6281 Muscle weakness (generalized): Secondary | ICD-10-CM | POA: Diagnosis not present

## 2016-12-16 DIAGNOSIS — R69 Illness, unspecified: Secondary | ICD-10-CM | POA: Diagnosis not present

## 2016-12-16 DIAGNOSIS — E038 Other specified hypothyroidism: Secondary | ICD-10-CM | POA: Diagnosis not present

## 2016-12-16 DIAGNOSIS — D510 Vitamin B12 deficiency anemia due to intrinsic factor deficiency: Secondary | ICD-10-CM | POA: Diagnosis not present

## 2016-12-18 DIAGNOSIS — S52022D Displaced fracture of olecranon process without intraarticular extension of left ulna, subsequent encounter for closed fracture with routine healing: Secondary | ICD-10-CM | POA: Diagnosis not present

## 2016-12-18 DIAGNOSIS — R69 Illness, unspecified: Secondary | ICD-10-CM | POA: Diagnosis not present

## 2016-12-18 DIAGNOSIS — I5022 Chronic systolic (congestive) heart failure: Secondary | ICD-10-CM | POA: Diagnosis not present

## 2016-12-18 DIAGNOSIS — M6281 Muscle weakness (generalized): Secondary | ICD-10-CM | POA: Diagnosis not present

## 2016-12-18 DIAGNOSIS — N3281 Overactive bladder: Secondary | ICD-10-CM | POA: Diagnosis not present

## 2016-12-18 DIAGNOSIS — M159 Polyosteoarthritis, unspecified: Secondary | ICD-10-CM | POA: Diagnosis not present

## 2016-12-18 DIAGNOSIS — D510 Vitamin B12 deficiency anemia due to intrinsic factor deficiency: Secondary | ICD-10-CM | POA: Diagnosis not present

## 2016-12-18 DIAGNOSIS — M4802 Spinal stenosis, cervical region: Secondary | ICD-10-CM | POA: Diagnosis not present

## 2016-12-18 DIAGNOSIS — I629 Nontraumatic intracranial hemorrhage, unspecified: Secondary | ICD-10-CM | POA: Diagnosis not present

## 2016-12-18 DIAGNOSIS — S0990XA Unspecified injury of head, initial encounter: Secondary | ICD-10-CM | POA: Diagnosis not present

## 2016-12-18 DIAGNOSIS — S0101XA Laceration without foreign body of scalp, initial encounter: Secondary | ICD-10-CM | POA: Diagnosis not present

## 2016-12-18 DIAGNOSIS — W19XXXA Unspecified fall, initial encounter: Secondary | ICD-10-CM | POA: Diagnosis not present

## 2016-12-18 DIAGNOSIS — S51011A Laceration without foreign body of right elbow, initial encounter: Secondary | ICD-10-CM | POA: Diagnosis not present

## 2016-12-18 DIAGNOSIS — I071 Rheumatic tricuspid insufficiency: Secondary | ICD-10-CM | POA: Diagnosis not present

## 2016-12-18 DIAGNOSIS — J449 Chronic obstructive pulmonary disease, unspecified: Secondary | ICD-10-CM | POA: Diagnosis not present

## 2016-12-21 DIAGNOSIS — N3281 Overactive bladder: Secondary | ICD-10-CM | POA: Diagnosis not present

## 2016-12-21 DIAGNOSIS — S52022D Displaced fracture of olecranon process without intraarticular extension of left ulna, subsequent encounter for closed fracture with routine healing: Secondary | ICD-10-CM | POA: Diagnosis not present

## 2016-12-21 DIAGNOSIS — E119 Type 2 diabetes mellitus without complications: Secondary | ICD-10-CM | POA: Diagnosis not present

## 2016-12-21 DIAGNOSIS — R748 Abnormal levels of other serum enzymes: Secondary | ICD-10-CM | POA: Diagnosis not present

## 2016-12-21 DIAGNOSIS — M6281 Muscle weakness (generalized): Secondary | ICD-10-CM | POA: Diagnosis not present

## 2016-12-21 DIAGNOSIS — D510 Vitamin B12 deficiency anemia due to intrinsic factor deficiency: Secondary | ICD-10-CM | POA: Diagnosis not present

## 2016-12-21 DIAGNOSIS — I071 Rheumatic tricuspid insufficiency: Secondary | ICD-10-CM | POA: Diagnosis not present

## 2016-12-21 DIAGNOSIS — I5022 Chronic systolic (congestive) heart failure: Secondary | ICD-10-CM | POA: Diagnosis not present

## 2016-12-21 DIAGNOSIS — J449 Chronic obstructive pulmonary disease, unspecified: Secondary | ICD-10-CM | POA: Diagnosis not present

## 2016-12-21 DIAGNOSIS — S06310D Contusion and laceration of right cerebrum without loss of consciousness, subsequent encounter: Secondary | ICD-10-CM | POA: Diagnosis not present

## 2016-12-21 DIAGNOSIS — R69 Illness, unspecified: Secondary | ICD-10-CM | POA: Diagnosis not present

## 2016-12-21 DIAGNOSIS — S51011D Laceration without foreign body of right elbow, subsequent encounter: Secondary | ICD-10-CM | POA: Diagnosis not present

## 2016-12-21 DIAGNOSIS — M159 Polyosteoarthritis, unspecified: Secondary | ICD-10-CM | POA: Diagnosis not present

## 2016-12-22 DIAGNOSIS — I5022 Chronic systolic (congestive) heart failure: Secondary | ICD-10-CM | POA: Diagnosis not present

## 2016-12-22 DIAGNOSIS — I071 Rheumatic tricuspid insufficiency: Secondary | ICD-10-CM | POA: Diagnosis not present

## 2016-12-22 DIAGNOSIS — M6281 Muscle weakness (generalized): Secondary | ICD-10-CM | POA: Diagnosis not present

## 2016-12-22 DIAGNOSIS — M159 Polyosteoarthritis, unspecified: Secondary | ICD-10-CM | POA: Diagnosis not present

## 2016-12-22 DIAGNOSIS — R69 Illness, unspecified: Secondary | ICD-10-CM | POA: Diagnosis not present

## 2016-12-22 DIAGNOSIS — D510 Vitamin B12 deficiency anemia due to intrinsic factor deficiency: Secondary | ICD-10-CM | POA: Diagnosis not present

## 2016-12-22 DIAGNOSIS — J449 Chronic obstructive pulmonary disease, unspecified: Secondary | ICD-10-CM | POA: Diagnosis not present

## 2016-12-22 DIAGNOSIS — S52022D Displaced fracture of olecranon process without intraarticular extension of left ulna, subsequent encounter for closed fracture with routine healing: Secondary | ICD-10-CM | POA: Diagnosis not present

## 2016-12-22 DIAGNOSIS — N3281 Overactive bladder: Secondary | ICD-10-CM | POA: Diagnosis not present

## 2016-12-23 DIAGNOSIS — I5022 Chronic systolic (congestive) heart failure: Secondary | ICD-10-CM | POA: Diagnosis not present

## 2016-12-23 DIAGNOSIS — R69 Illness, unspecified: Secondary | ICD-10-CM | POA: Diagnosis not present

## 2016-12-23 DIAGNOSIS — M6281 Muscle weakness (generalized): Secondary | ICD-10-CM | POA: Diagnosis not present

## 2016-12-23 DIAGNOSIS — J449 Chronic obstructive pulmonary disease, unspecified: Secondary | ICD-10-CM | POA: Diagnosis not present

## 2016-12-23 DIAGNOSIS — N3281 Overactive bladder: Secondary | ICD-10-CM | POA: Diagnosis not present

## 2016-12-23 DIAGNOSIS — I071 Rheumatic tricuspid insufficiency: Secondary | ICD-10-CM | POA: Diagnosis not present

## 2016-12-23 DIAGNOSIS — D510 Vitamin B12 deficiency anemia due to intrinsic factor deficiency: Secondary | ICD-10-CM | POA: Diagnosis not present

## 2016-12-23 DIAGNOSIS — M159 Polyosteoarthritis, unspecified: Secondary | ICD-10-CM | POA: Diagnosis not present

## 2016-12-23 DIAGNOSIS — S52022D Displaced fracture of olecranon process without intraarticular extension of left ulna, subsequent encounter for closed fracture with routine healing: Secondary | ICD-10-CM | POA: Diagnosis not present

## 2016-12-25 DIAGNOSIS — N3281 Overactive bladder: Secondary | ICD-10-CM | POA: Diagnosis not present

## 2016-12-25 DIAGNOSIS — M6281 Muscle weakness (generalized): Secondary | ICD-10-CM | POA: Diagnosis not present

## 2016-12-25 DIAGNOSIS — S52022D Displaced fracture of olecranon process without intraarticular extension of left ulna, subsequent encounter for closed fracture with routine healing: Secondary | ICD-10-CM | POA: Diagnosis not present

## 2016-12-25 DIAGNOSIS — J449 Chronic obstructive pulmonary disease, unspecified: Secondary | ICD-10-CM | POA: Diagnosis not present

## 2016-12-25 DIAGNOSIS — D510 Vitamin B12 deficiency anemia due to intrinsic factor deficiency: Secondary | ICD-10-CM | POA: Diagnosis not present

## 2016-12-25 DIAGNOSIS — M159 Polyosteoarthritis, unspecified: Secondary | ICD-10-CM | POA: Diagnosis not present

## 2016-12-25 DIAGNOSIS — R69 Illness, unspecified: Secondary | ICD-10-CM | POA: Diagnosis not present

## 2016-12-25 DIAGNOSIS — I071 Rheumatic tricuspid insufficiency: Secondary | ICD-10-CM | POA: Diagnosis not present

## 2016-12-25 DIAGNOSIS — I5022 Chronic systolic (congestive) heart failure: Secondary | ICD-10-CM | POA: Diagnosis not present

## 2016-12-28 DIAGNOSIS — S52022D Displaced fracture of olecranon process without intraarticular extension of left ulna, subsequent encounter for closed fracture with routine healing: Secondary | ICD-10-CM | POA: Diagnosis not present

## 2016-12-28 DIAGNOSIS — S06310D Contusion and laceration of right cerebrum without loss of consciousness, subsequent encounter: Secondary | ICD-10-CM | POA: Diagnosis not present

## 2016-12-28 DIAGNOSIS — I071 Rheumatic tricuspid insufficiency: Secondary | ICD-10-CM | POA: Diagnosis not present

## 2016-12-28 DIAGNOSIS — I5022 Chronic systolic (congestive) heart failure: Secondary | ICD-10-CM | POA: Diagnosis not present

## 2016-12-28 DIAGNOSIS — J449 Chronic obstructive pulmonary disease, unspecified: Secondary | ICD-10-CM | POA: Diagnosis not present

## 2016-12-28 DIAGNOSIS — M6281 Muscle weakness (generalized): Secondary | ICD-10-CM | POA: Diagnosis not present

## 2016-12-28 DIAGNOSIS — D510 Vitamin B12 deficiency anemia due to intrinsic factor deficiency: Secondary | ICD-10-CM | POA: Diagnosis not present

## 2016-12-28 DIAGNOSIS — M159 Polyosteoarthritis, unspecified: Secondary | ICD-10-CM | POA: Diagnosis not present

## 2016-12-28 DIAGNOSIS — R69 Illness, unspecified: Secondary | ICD-10-CM | POA: Diagnosis not present

## 2016-12-28 DIAGNOSIS — N3281 Overactive bladder: Secondary | ICD-10-CM | POA: Diagnosis not present

## 2016-12-29 DIAGNOSIS — R69 Illness, unspecified: Secondary | ICD-10-CM | POA: Diagnosis not present

## 2016-12-29 DIAGNOSIS — D510 Vitamin B12 deficiency anemia due to intrinsic factor deficiency: Secondary | ICD-10-CM | POA: Diagnosis not present

## 2016-12-29 DIAGNOSIS — M159 Polyosteoarthritis, unspecified: Secondary | ICD-10-CM | POA: Diagnosis not present

## 2016-12-29 DIAGNOSIS — I5022 Chronic systolic (congestive) heart failure: Secondary | ICD-10-CM | POA: Diagnosis not present

## 2016-12-29 DIAGNOSIS — S52022D Displaced fracture of olecranon process without intraarticular extension of left ulna, subsequent encounter for closed fracture with routine healing: Secondary | ICD-10-CM | POA: Diagnosis not present

## 2016-12-29 DIAGNOSIS — N3281 Overactive bladder: Secondary | ICD-10-CM | POA: Diagnosis not present

## 2016-12-29 DIAGNOSIS — M6281 Muscle weakness (generalized): Secondary | ICD-10-CM | POA: Diagnosis not present

## 2016-12-29 DIAGNOSIS — I071 Rheumatic tricuspid insufficiency: Secondary | ICD-10-CM | POA: Diagnosis not present

## 2016-12-29 DIAGNOSIS — J449 Chronic obstructive pulmonary disease, unspecified: Secondary | ICD-10-CM | POA: Diagnosis not present

## 2016-12-31 DIAGNOSIS — M6281 Muscle weakness (generalized): Secondary | ICD-10-CM | POA: Diagnosis not present

## 2016-12-31 DIAGNOSIS — J449 Chronic obstructive pulmonary disease, unspecified: Secondary | ICD-10-CM | POA: Diagnosis not present

## 2016-12-31 DIAGNOSIS — D510 Vitamin B12 deficiency anemia due to intrinsic factor deficiency: Secondary | ICD-10-CM | POA: Diagnosis not present

## 2016-12-31 DIAGNOSIS — I071 Rheumatic tricuspid insufficiency: Secondary | ICD-10-CM | POA: Diagnosis not present

## 2016-12-31 DIAGNOSIS — N3281 Overactive bladder: Secondary | ICD-10-CM | POA: Diagnosis not present

## 2016-12-31 DIAGNOSIS — S52022D Displaced fracture of olecranon process without intraarticular extension of left ulna, subsequent encounter for closed fracture with routine healing: Secondary | ICD-10-CM | POA: Diagnosis not present

## 2016-12-31 DIAGNOSIS — M159 Polyosteoarthritis, unspecified: Secondary | ICD-10-CM | POA: Diagnosis not present

## 2016-12-31 DIAGNOSIS — I5022 Chronic systolic (congestive) heart failure: Secondary | ICD-10-CM | POA: Diagnosis not present

## 2016-12-31 DIAGNOSIS — R69 Illness, unspecified: Secondary | ICD-10-CM | POA: Diagnosis not present

## 2017-01-01 DIAGNOSIS — I5022 Chronic systolic (congestive) heart failure: Secondary | ICD-10-CM | POA: Diagnosis not present

## 2017-01-01 DIAGNOSIS — D510 Vitamin B12 deficiency anemia due to intrinsic factor deficiency: Secondary | ICD-10-CM | POA: Diagnosis not present

## 2017-01-01 DIAGNOSIS — I071 Rheumatic tricuspid insufficiency: Secondary | ICD-10-CM | POA: Diagnosis not present

## 2017-01-01 DIAGNOSIS — S52022D Displaced fracture of olecranon process without intraarticular extension of left ulna, subsequent encounter for closed fracture with routine healing: Secondary | ICD-10-CM | POA: Diagnosis not present

## 2017-01-01 DIAGNOSIS — J449 Chronic obstructive pulmonary disease, unspecified: Secondary | ICD-10-CM | POA: Diagnosis not present

## 2017-01-01 DIAGNOSIS — N3281 Overactive bladder: Secondary | ICD-10-CM | POA: Diagnosis not present

## 2017-01-01 DIAGNOSIS — M159 Polyosteoarthritis, unspecified: Secondary | ICD-10-CM | POA: Diagnosis not present

## 2017-01-01 DIAGNOSIS — M6281 Muscle weakness (generalized): Secondary | ICD-10-CM | POA: Diagnosis not present

## 2017-01-01 DIAGNOSIS — R69 Illness, unspecified: Secondary | ICD-10-CM | POA: Diagnosis not present

## 2017-01-04 DIAGNOSIS — J449 Chronic obstructive pulmonary disease, unspecified: Secondary | ICD-10-CM | POA: Diagnosis not present

## 2017-01-04 DIAGNOSIS — M6281 Muscle weakness (generalized): Secondary | ICD-10-CM | POA: Diagnosis not present

## 2017-01-04 DIAGNOSIS — I5022 Chronic systolic (congestive) heart failure: Secondary | ICD-10-CM | POA: Diagnosis not present

## 2017-01-04 DIAGNOSIS — N3281 Overactive bladder: Secondary | ICD-10-CM | POA: Diagnosis not present

## 2017-01-04 DIAGNOSIS — D510 Vitamin B12 deficiency anemia due to intrinsic factor deficiency: Secondary | ICD-10-CM | POA: Diagnosis not present

## 2017-01-04 DIAGNOSIS — S52022D Displaced fracture of olecranon process without intraarticular extension of left ulna, subsequent encounter for closed fracture with routine healing: Secondary | ICD-10-CM | POA: Diagnosis not present

## 2017-01-04 DIAGNOSIS — I071 Rheumatic tricuspid insufficiency: Secondary | ICD-10-CM | POA: Diagnosis not present

## 2017-01-04 DIAGNOSIS — R69 Illness, unspecified: Secondary | ICD-10-CM | POA: Diagnosis not present

## 2017-01-04 DIAGNOSIS — M159 Polyosteoarthritis, unspecified: Secondary | ICD-10-CM | POA: Diagnosis not present

## 2017-01-05 DIAGNOSIS — I5022 Chronic systolic (congestive) heart failure: Secondary | ICD-10-CM | POA: Diagnosis not present

## 2017-01-05 DIAGNOSIS — M6281 Muscle weakness (generalized): Secondary | ICD-10-CM | POA: Diagnosis not present

## 2017-01-05 DIAGNOSIS — N3281 Overactive bladder: Secondary | ICD-10-CM | POA: Diagnosis not present

## 2017-01-05 DIAGNOSIS — D510 Vitamin B12 deficiency anemia due to intrinsic factor deficiency: Secondary | ICD-10-CM | POA: Diagnosis not present

## 2017-01-05 DIAGNOSIS — R69 Illness, unspecified: Secondary | ICD-10-CM | POA: Diagnosis not present

## 2017-01-05 DIAGNOSIS — S52022D Displaced fracture of olecranon process without intraarticular extension of left ulna, subsequent encounter for closed fracture with routine healing: Secondary | ICD-10-CM | POA: Diagnosis not present

## 2017-01-05 DIAGNOSIS — J449 Chronic obstructive pulmonary disease, unspecified: Secondary | ICD-10-CM | POA: Diagnosis not present

## 2017-01-05 DIAGNOSIS — M159 Polyosteoarthritis, unspecified: Secondary | ICD-10-CM | POA: Diagnosis not present

## 2017-01-05 DIAGNOSIS — I071 Rheumatic tricuspid insufficiency: Secondary | ICD-10-CM | POA: Diagnosis not present

## 2017-01-06 DIAGNOSIS — N3281 Overactive bladder: Secondary | ICD-10-CM | POA: Diagnosis not present

## 2017-01-06 DIAGNOSIS — S52022D Displaced fracture of olecranon process without intraarticular extension of left ulna, subsequent encounter for closed fracture with routine healing: Secondary | ICD-10-CM | POA: Diagnosis not present

## 2017-01-06 DIAGNOSIS — M159 Polyosteoarthritis, unspecified: Secondary | ICD-10-CM | POA: Diagnosis not present

## 2017-01-06 DIAGNOSIS — I071 Rheumatic tricuspid insufficiency: Secondary | ICD-10-CM | POA: Diagnosis not present

## 2017-01-06 DIAGNOSIS — R69 Illness, unspecified: Secondary | ICD-10-CM | POA: Diagnosis not present

## 2017-01-06 DIAGNOSIS — M6281 Muscle weakness (generalized): Secondary | ICD-10-CM | POA: Diagnosis not present

## 2017-01-06 DIAGNOSIS — J449 Chronic obstructive pulmonary disease, unspecified: Secondary | ICD-10-CM | POA: Diagnosis not present

## 2017-01-06 DIAGNOSIS — I5022 Chronic systolic (congestive) heart failure: Secondary | ICD-10-CM | POA: Diagnosis not present

## 2017-01-06 DIAGNOSIS — D510 Vitamin B12 deficiency anemia due to intrinsic factor deficiency: Secondary | ICD-10-CM | POA: Diagnosis not present

## 2017-01-07 DIAGNOSIS — R32 Unspecified urinary incontinence: Secondary | ICD-10-CM | POA: Diagnosis not present

## 2017-01-08 DIAGNOSIS — J449 Chronic obstructive pulmonary disease, unspecified: Secondary | ICD-10-CM | POA: Diagnosis not present

## 2017-01-08 DIAGNOSIS — I5022 Chronic systolic (congestive) heart failure: Secondary | ICD-10-CM | POA: Diagnosis not present

## 2017-01-08 DIAGNOSIS — M6281 Muscle weakness (generalized): Secondary | ICD-10-CM | POA: Diagnosis not present

## 2017-01-08 DIAGNOSIS — M159 Polyosteoarthritis, unspecified: Secondary | ICD-10-CM | POA: Diagnosis not present

## 2017-01-08 DIAGNOSIS — D510 Vitamin B12 deficiency anemia due to intrinsic factor deficiency: Secondary | ICD-10-CM | POA: Diagnosis not present

## 2017-01-08 DIAGNOSIS — R69 Illness, unspecified: Secondary | ICD-10-CM | POA: Diagnosis not present

## 2017-01-08 DIAGNOSIS — S52022D Displaced fracture of olecranon process without intraarticular extension of left ulna, subsequent encounter for closed fracture with routine healing: Secondary | ICD-10-CM | POA: Diagnosis not present

## 2017-01-08 DIAGNOSIS — N3281 Overactive bladder: Secondary | ICD-10-CM | POA: Diagnosis not present

## 2017-01-08 DIAGNOSIS — I071 Rheumatic tricuspid insufficiency: Secondary | ICD-10-CM | POA: Diagnosis not present

## 2017-01-10 DIAGNOSIS — J449 Chronic obstructive pulmonary disease, unspecified: Secondary | ICD-10-CM | POA: Diagnosis not present

## 2017-01-11 DIAGNOSIS — R69 Illness, unspecified: Secondary | ICD-10-CM | POA: Diagnosis not present

## 2017-01-11 DIAGNOSIS — I5022 Chronic systolic (congestive) heart failure: Secondary | ICD-10-CM | POA: Diagnosis not present

## 2017-01-11 DIAGNOSIS — R296 Repeated falls: Secondary | ICD-10-CM | POA: Diagnosis not present

## 2017-01-11 DIAGNOSIS — N3281 Overactive bladder: Secondary | ICD-10-CM | POA: Diagnosis not present

## 2017-01-11 DIAGNOSIS — E039 Hypothyroidism, unspecified: Secondary | ICD-10-CM | POA: Diagnosis not present

## 2017-01-11 DIAGNOSIS — S52022D Displaced fracture of olecranon process without intraarticular extension of left ulna, subsequent encounter for closed fracture with routine healing: Secondary | ICD-10-CM | POA: Diagnosis not present

## 2017-01-11 DIAGNOSIS — M6281 Muscle weakness (generalized): Secondary | ICD-10-CM | POA: Diagnosis not present

## 2017-01-11 DIAGNOSIS — I071 Rheumatic tricuspid insufficiency: Secondary | ICD-10-CM | POA: Diagnosis not present

## 2017-01-11 DIAGNOSIS — M159 Polyosteoarthritis, unspecified: Secondary | ICD-10-CM | POA: Diagnosis not present

## 2017-01-11 DIAGNOSIS — D510 Vitamin B12 deficiency anemia due to intrinsic factor deficiency: Secondary | ICD-10-CM | POA: Diagnosis not present

## 2017-01-11 DIAGNOSIS — J449 Chronic obstructive pulmonary disease, unspecified: Secondary | ICD-10-CM | POA: Diagnosis not present

## 2017-01-13 DIAGNOSIS — R079 Chest pain, unspecified: Secondary | ICD-10-CM | POA: Diagnosis not present

## 2017-01-13 DIAGNOSIS — J449 Chronic obstructive pulmonary disease, unspecified: Secondary | ICD-10-CM | POA: Diagnosis not present

## 2017-01-14 DIAGNOSIS — N3281 Overactive bladder: Secondary | ICD-10-CM | POA: Diagnosis not present

## 2017-01-14 DIAGNOSIS — I071 Rheumatic tricuspid insufficiency: Secondary | ICD-10-CM | POA: Diagnosis not present

## 2017-01-14 DIAGNOSIS — D510 Vitamin B12 deficiency anemia due to intrinsic factor deficiency: Secondary | ICD-10-CM | POA: Diagnosis not present

## 2017-01-14 DIAGNOSIS — S52022D Displaced fracture of olecranon process without intraarticular extension of left ulna, subsequent encounter for closed fracture with routine healing: Secondary | ICD-10-CM | POA: Diagnosis not present

## 2017-01-14 DIAGNOSIS — I5022 Chronic systolic (congestive) heart failure: Secondary | ICD-10-CM | POA: Diagnosis not present

## 2017-01-14 DIAGNOSIS — M6281 Muscle weakness (generalized): Secondary | ICD-10-CM | POA: Diagnosis not present

## 2017-01-14 DIAGNOSIS — M159 Polyosteoarthritis, unspecified: Secondary | ICD-10-CM | POA: Diagnosis not present

## 2017-01-14 DIAGNOSIS — R69 Illness, unspecified: Secondary | ICD-10-CM | POA: Diagnosis not present

## 2017-01-14 DIAGNOSIS — J449 Chronic obstructive pulmonary disease, unspecified: Secondary | ICD-10-CM | POA: Diagnosis not present

## 2017-01-15 DIAGNOSIS — N3001 Acute cystitis with hematuria: Secondary | ICD-10-CM | POA: Diagnosis not present

## 2017-01-15 DIAGNOSIS — R0902 Hypoxemia: Secondary | ICD-10-CM | POA: Diagnosis not present

## 2017-01-15 DIAGNOSIS — R05 Cough: Secondary | ICD-10-CM | POA: Diagnosis not present

## 2017-01-18 DIAGNOSIS — M6281 Muscle weakness (generalized): Secondary | ICD-10-CM | POA: Diagnosis not present

## 2017-01-18 DIAGNOSIS — M159 Polyosteoarthritis, unspecified: Secondary | ICD-10-CM | POA: Diagnosis not present

## 2017-01-18 DIAGNOSIS — I071 Rheumatic tricuspid insufficiency: Secondary | ICD-10-CM | POA: Diagnosis not present

## 2017-01-18 DIAGNOSIS — J449 Chronic obstructive pulmonary disease, unspecified: Secondary | ICD-10-CM | POA: Diagnosis not present

## 2017-01-18 DIAGNOSIS — N3281 Overactive bladder: Secondary | ICD-10-CM | POA: Diagnosis not present

## 2017-01-18 DIAGNOSIS — I5022 Chronic systolic (congestive) heart failure: Secondary | ICD-10-CM | POA: Diagnosis not present

## 2017-01-18 DIAGNOSIS — S52022D Displaced fracture of olecranon process without intraarticular extension of left ulna, subsequent encounter for closed fracture with routine healing: Secondary | ICD-10-CM | POA: Diagnosis not present

## 2017-01-18 DIAGNOSIS — N390 Urinary tract infection, site not specified: Secondary | ICD-10-CM | POA: Diagnosis not present

## 2017-01-18 DIAGNOSIS — D510 Vitamin B12 deficiency anemia due to intrinsic factor deficiency: Secondary | ICD-10-CM | POA: Diagnosis not present

## 2017-01-18 DIAGNOSIS — R69 Illness, unspecified: Secondary | ICD-10-CM | POA: Diagnosis not present

## 2017-01-19 DIAGNOSIS — S52022D Displaced fracture of olecranon process without intraarticular extension of left ulna, subsequent encounter for closed fracture with routine healing: Secondary | ICD-10-CM | POA: Diagnosis not present

## 2017-01-19 DIAGNOSIS — I5022 Chronic systolic (congestive) heart failure: Secondary | ICD-10-CM | POA: Diagnosis not present

## 2017-01-19 DIAGNOSIS — D510 Vitamin B12 deficiency anemia due to intrinsic factor deficiency: Secondary | ICD-10-CM | POA: Diagnosis not present

## 2017-01-19 DIAGNOSIS — R69 Illness, unspecified: Secondary | ICD-10-CM | POA: Diagnosis not present

## 2017-01-19 DIAGNOSIS — I071 Rheumatic tricuspid insufficiency: Secondary | ICD-10-CM | POA: Diagnosis not present

## 2017-01-19 DIAGNOSIS — N3281 Overactive bladder: Secondary | ICD-10-CM | POA: Diagnosis not present

## 2017-01-19 DIAGNOSIS — M159 Polyosteoarthritis, unspecified: Secondary | ICD-10-CM | POA: Diagnosis not present

## 2017-01-19 DIAGNOSIS — J449 Chronic obstructive pulmonary disease, unspecified: Secondary | ICD-10-CM | POA: Diagnosis not present

## 2017-01-19 DIAGNOSIS — M6281 Muscle weakness (generalized): Secondary | ICD-10-CM | POA: Diagnosis not present

## 2017-01-20 DIAGNOSIS — M159 Polyosteoarthritis, unspecified: Secondary | ICD-10-CM | POA: Diagnosis not present

## 2017-01-20 DIAGNOSIS — R69 Illness, unspecified: Secondary | ICD-10-CM | POA: Diagnosis not present

## 2017-01-20 DIAGNOSIS — E038 Other specified hypothyroidism: Secondary | ICD-10-CM | POA: Diagnosis not present

## 2017-01-20 DIAGNOSIS — E559 Vitamin D deficiency, unspecified: Secondary | ICD-10-CM | POA: Diagnosis not present

## 2017-01-20 DIAGNOSIS — J449 Chronic obstructive pulmonary disease, unspecified: Secondary | ICD-10-CM | POA: Diagnosis not present

## 2017-01-20 DIAGNOSIS — N3281 Overactive bladder: Secondary | ICD-10-CM | POA: Diagnosis not present

## 2017-01-20 DIAGNOSIS — D518 Other vitamin B12 deficiency anemias: Secondary | ICD-10-CM | POA: Diagnosis not present

## 2017-01-20 DIAGNOSIS — Z79899 Other long term (current) drug therapy: Secondary | ICD-10-CM | POA: Diagnosis not present

## 2017-01-20 DIAGNOSIS — S52022D Displaced fracture of olecranon process without intraarticular extension of left ulna, subsequent encounter for closed fracture with routine healing: Secondary | ICD-10-CM | POA: Diagnosis not present

## 2017-01-20 DIAGNOSIS — D510 Vitamin B12 deficiency anemia due to intrinsic factor deficiency: Secondary | ICD-10-CM | POA: Diagnosis not present

## 2017-01-20 DIAGNOSIS — I071 Rheumatic tricuspid insufficiency: Secondary | ICD-10-CM | POA: Diagnosis not present

## 2017-01-20 DIAGNOSIS — I5022 Chronic systolic (congestive) heart failure: Secondary | ICD-10-CM | POA: Diagnosis not present

## 2017-01-20 DIAGNOSIS — E782 Mixed hyperlipidemia: Secondary | ICD-10-CM | POA: Diagnosis not present

## 2017-01-20 DIAGNOSIS — M6281 Muscle weakness (generalized): Secondary | ICD-10-CM | POA: Diagnosis not present

## 2017-01-20 DIAGNOSIS — E119 Type 2 diabetes mellitus without complications: Secondary | ICD-10-CM | POA: Diagnosis not present

## 2017-01-21 DIAGNOSIS — S52022D Displaced fracture of olecranon process without intraarticular extension of left ulna, subsequent encounter for closed fracture with routine healing: Secondary | ICD-10-CM | POA: Diagnosis not present

## 2017-01-21 DIAGNOSIS — M6281 Muscle weakness (generalized): Secondary | ICD-10-CM | POA: Diagnosis not present

## 2017-01-21 DIAGNOSIS — D510 Vitamin B12 deficiency anemia due to intrinsic factor deficiency: Secondary | ICD-10-CM | POA: Diagnosis not present

## 2017-01-21 DIAGNOSIS — N3281 Overactive bladder: Secondary | ICD-10-CM | POA: Diagnosis not present

## 2017-01-21 DIAGNOSIS — I5022 Chronic systolic (congestive) heart failure: Secondary | ICD-10-CM | POA: Diagnosis not present

## 2017-01-21 DIAGNOSIS — M159 Polyosteoarthritis, unspecified: Secondary | ICD-10-CM | POA: Diagnosis not present

## 2017-01-21 DIAGNOSIS — R69 Illness, unspecified: Secondary | ICD-10-CM | POA: Diagnosis not present

## 2017-01-21 DIAGNOSIS — J449 Chronic obstructive pulmonary disease, unspecified: Secondary | ICD-10-CM | POA: Diagnosis not present

## 2017-01-21 DIAGNOSIS — I071 Rheumatic tricuspid insufficiency: Secondary | ICD-10-CM | POA: Diagnosis not present

## 2017-01-22 DIAGNOSIS — D510 Vitamin B12 deficiency anemia due to intrinsic factor deficiency: Secondary | ICD-10-CM | POA: Diagnosis not present

## 2017-01-22 DIAGNOSIS — M159 Polyosteoarthritis, unspecified: Secondary | ICD-10-CM | POA: Diagnosis not present

## 2017-01-22 DIAGNOSIS — J449 Chronic obstructive pulmonary disease, unspecified: Secondary | ICD-10-CM | POA: Diagnosis not present

## 2017-01-22 DIAGNOSIS — M6281 Muscle weakness (generalized): Secondary | ICD-10-CM | POA: Diagnosis not present

## 2017-01-22 DIAGNOSIS — I071 Rheumatic tricuspid insufficiency: Secondary | ICD-10-CM | POA: Diagnosis not present

## 2017-01-22 DIAGNOSIS — R69 Illness, unspecified: Secondary | ICD-10-CM | POA: Diagnosis not present

## 2017-01-22 DIAGNOSIS — S52022D Displaced fracture of olecranon process without intraarticular extension of left ulna, subsequent encounter for closed fracture with routine healing: Secondary | ICD-10-CM | POA: Diagnosis not present

## 2017-01-22 DIAGNOSIS — N3281 Overactive bladder: Secondary | ICD-10-CM | POA: Diagnosis not present

## 2017-01-22 DIAGNOSIS — I5022 Chronic systolic (congestive) heart failure: Secondary | ICD-10-CM | POA: Diagnosis not present

## 2017-01-26 DIAGNOSIS — D510 Vitamin B12 deficiency anemia due to intrinsic factor deficiency: Secondary | ICD-10-CM | POA: Diagnosis not present

## 2017-01-26 DIAGNOSIS — J449 Chronic obstructive pulmonary disease, unspecified: Secondary | ICD-10-CM | POA: Diagnosis not present

## 2017-01-26 DIAGNOSIS — S52022D Displaced fracture of olecranon process without intraarticular extension of left ulna, subsequent encounter for closed fracture with routine healing: Secondary | ICD-10-CM | POA: Diagnosis not present

## 2017-01-26 DIAGNOSIS — I5022 Chronic systolic (congestive) heart failure: Secondary | ICD-10-CM | POA: Diagnosis not present

## 2017-01-26 DIAGNOSIS — M6281 Muscle weakness (generalized): Secondary | ICD-10-CM | POA: Diagnosis not present

## 2017-01-26 DIAGNOSIS — R69 Illness, unspecified: Secondary | ICD-10-CM | POA: Diagnosis not present

## 2017-01-26 DIAGNOSIS — I071 Rheumatic tricuspid insufficiency: Secondary | ICD-10-CM | POA: Diagnosis not present

## 2017-01-26 DIAGNOSIS — M159 Polyosteoarthritis, unspecified: Secondary | ICD-10-CM | POA: Diagnosis not present

## 2017-01-26 DIAGNOSIS — N3281 Overactive bladder: Secondary | ICD-10-CM | POA: Diagnosis not present

## 2017-01-27 DIAGNOSIS — M6281 Muscle weakness (generalized): Secondary | ICD-10-CM | POA: Diagnosis not present

## 2017-01-27 DIAGNOSIS — R69 Illness, unspecified: Secondary | ICD-10-CM | POA: Diagnosis not present

## 2017-01-27 DIAGNOSIS — Z79899 Other long term (current) drug therapy: Secondary | ICD-10-CM | POA: Diagnosis not present

## 2017-01-27 DIAGNOSIS — E559 Vitamin D deficiency, unspecified: Secondary | ICD-10-CM | POA: Diagnosis not present

## 2017-01-27 DIAGNOSIS — D510 Vitamin B12 deficiency anemia due to intrinsic factor deficiency: Secondary | ICD-10-CM | POA: Diagnosis not present

## 2017-01-27 DIAGNOSIS — S52022D Displaced fracture of olecranon process without intraarticular extension of left ulna, subsequent encounter for closed fracture with routine healing: Secondary | ICD-10-CM | POA: Diagnosis not present

## 2017-01-27 DIAGNOSIS — J449 Chronic obstructive pulmonary disease, unspecified: Secondary | ICD-10-CM | POA: Diagnosis not present

## 2017-01-27 DIAGNOSIS — E038 Other specified hypothyroidism: Secondary | ICD-10-CM | POA: Diagnosis not present

## 2017-01-27 DIAGNOSIS — I5022 Chronic systolic (congestive) heart failure: Secondary | ICD-10-CM | POA: Diagnosis not present

## 2017-01-27 DIAGNOSIS — I071 Rheumatic tricuspid insufficiency: Secondary | ICD-10-CM | POA: Diagnosis not present

## 2017-01-27 DIAGNOSIS — E119 Type 2 diabetes mellitus without complications: Secondary | ICD-10-CM | POA: Diagnosis not present

## 2017-01-27 DIAGNOSIS — M159 Polyosteoarthritis, unspecified: Secondary | ICD-10-CM | POA: Diagnosis not present

## 2017-01-27 DIAGNOSIS — N3281 Overactive bladder: Secondary | ICD-10-CM | POA: Diagnosis not present

## 2017-01-27 DIAGNOSIS — D518 Other vitamin B12 deficiency anemias: Secondary | ICD-10-CM | POA: Diagnosis not present

## 2017-01-27 DIAGNOSIS — E782 Mixed hyperlipidemia: Secondary | ICD-10-CM | POA: Diagnosis not present

## 2017-01-29 DIAGNOSIS — N390 Urinary tract infection, site not specified: Secondary | ICD-10-CM | POA: Diagnosis not present

## 2017-02-05 DIAGNOSIS — N3281 Overactive bladder: Secondary | ICD-10-CM | POA: Diagnosis not present

## 2017-02-05 DIAGNOSIS — S52022D Displaced fracture of olecranon process without intraarticular extension of left ulna, subsequent encounter for closed fracture with routine healing: Secondary | ICD-10-CM | POA: Diagnosis not present

## 2017-02-05 DIAGNOSIS — I071 Rheumatic tricuspid insufficiency: Secondary | ICD-10-CM | POA: Diagnosis not present

## 2017-02-05 DIAGNOSIS — I5022 Chronic systolic (congestive) heart failure: Secondary | ICD-10-CM | POA: Diagnosis not present

## 2017-02-05 DIAGNOSIS — R69 Illness, unspecified: Secondary | ICD-10-CM | POA: Diagnosis not present

## 2017-02-05 DIAGNOSIS — M159 Polyosteoarthritis, unspecified: Secondary | ICD-10-CM | POA: Diagnosis not present

## 2017-02-05 DIAGNOSIS — M6281 Muscle weakness (generalized): Secondary | ICD-10-CM | POA: Diagnosis not present

## 2017-02-05 DIAGNOSIS — D510 Vitamin B12 deficiency anemia due to intrinsic factor deficiency: Secondary | ICD-10-CM | POA: Diagnosis not present

## 2017-02-05 DIAGNOSIS — J449 Chronic obstructive pulmonary disease, unspecified: Secondary | ICD-10-CM | POA: Diagnosis not present

## 2017-02-10 DIAGNOSIS — J449 Chronic obstructive pulmonary disease, unspecified: Secondary | ICD-10-CM | POA: Diagnosis not present

## 2017-02-13 DIAGNOSIS — J449 Chronic obstructive pulmonary disease, unspecified: Secondary | ICD-10-CM | POA: Diagnosis not present

## 2017-02-13 DIAGNOSIS — E119 Type 2 diabetes mellitus without complications: Secondary | ICD-10-CM | POA: Diagnosis not present

## 2017-02-15 DIAGNOSIS — J449 Chronic obstructive pulmonary disease, unspecified: Secondary | ICD-10-CM | POA: Diagnosis not present

## 2017-02-15 DIAGNOSIS — I5022 Chronic systolic (congestive) heart failure: Secondary | ICD-10-CM | POA: Diagnosis not present

## 2017-02-15 DIAGNOSIS — R69 Illness, unspecified: Secondary | ICD-10-CM | POA: Diagnosis not present

## 2017-02-15 DIAGNOSIS — I2510 Atherosclerotic heart disease of native coronary artery without angina pectoris: Secondary | ICD-10-CM | POA: Diagnosis not present

## 2017-02-20 DIAGNOSIS — Z13820 Encounter for screening for osteoporosis: Secondary | ICD-10-CM | POA: Diagnosis not present

## 2017-02-22 DIAGNOSIS — I739 Peripheral vascular disease, unspecified: Secondary | ICD-10-CM | POA: Diagnosis not present

## 2017-02-22 DIAGNOSIS — R32 Unspecified urinary incontinence: Secondary | ICD-10-CM | POA: Diagnosis not present

## 2017-02-23 DIAGNOSIS — I739 Peripheral vascular disease, unspecified: Secondary | ICD-10-CM | POA: Diagnosis not present

## 2017-03-13 DIAGNOSIS — J449 Chronic obstructive pulmonary disease, unspecified: Secondary | ICD-10-CM | POA: Diagnosis not present

## 2017-03-15 DIAGNOSIS — E039 Hypothyroidism, unspecified: Secondary | ICD-10-CM | POA: Diagnosis not present

## 2017-03-15 DIAGNOSIS — J449 Chronic obstructive pulmonary disease, unspecified: Secondary | ICD-10-CM | POA: Diagnosis not present

## 2017-03-15 DIAGNOSIS — I5022 Chronic systolic (congestive) heart failure: Secondary | ICD-10-CM | POA: Diagnosis not present

## 2017-03-15 DIAGNOSIS — I739 Peripheral vascular disease, unspecified: Secondary | ICD-10-CM | POA: Diagnosis not present

## 2017-03-16 DIAGNOSIS — J449 Chronic obstructive pulmonary disease, unspecified: Secondary | ICD-10-CM | POA: Diagnosis not present

## 2017-03-17 DIAGNOSIS — J449 Chronic obstructive pulmonary disease, unspecified: Secondary | ICD-10-CM | POA: Diagnosis not present

## 2017-03-17 DIAGNOSIS — N189 Chronic kidney disease, unspecified: Secondary | ICD-10-CM | POA: Diagnosis not present

## 2017-03-17 DIAGNOSIS — I70213 Atherosclerosis of native arteries of extremities with intermittent claudication, bilateral legs: Secondary | ICD-10-CM | POA: Diagnosis not present

## 2017-03-17 DIAGNOSIS — I2510 Atherosclerotic heart disease of native coronary artery without angina pectoris: Secondary | ICD-10-CM | POA: Diagnosis not present

## 2017-03-17 DIAGNOSIS — E1169 Type 2 diabetes mellitus with other specified complication: Secondary | ICD-10-CM | POA: Diagnosis not present

## 2017-03-17 DIAGNOSIS — E1151 Type 2 diabetes mellitus with diabetic peripheral angiopathy without gangrene: Secondary | ICD-10-CM | POA: Diagnosis not present

## 2017-03-17 DIAGNOSIS — I872 Venous insufficiency (chronic) (peripheral): Secondary | ICD-10-CM | POA: Diagnosis not present

## 2017-03-29 DIAGNOSIS — R69 Illness, unspecified: Secondary | ICD-10-CM | POA: Diagnosis not present

## 2017-03-29 DIAGNOSIS — E538 Deficiency of other specified B group vitamins: Secondary | ICD-10-CM | POA: Diagnosis not present

## 2017-03-29 DIAGNOSIS — K219 Gastro-esophageal reflux disease without esophagitis: Secondary | ICD-10-CM | POA: Diagnosis not present

## 2017-03-29 DIAGNOSIS — E785 Hyperlipidemia, unspecified: Secondary | ICD-10-CM | POA: Diagnosis not present

## 2017-03-29 DIAGNOSIS — I5022 Chronic systolic (congestive) heart failure: Secondary | ICD-10-CM | POA: Diagnosis not present

## 2017-03-29 DIAGNOSIS — J449 Chronic obstructive pulmonary disease, unspecified: Secondary | ICD-10-CM | POA: Diagnosis not present

## 2017-03-29 DIAGNOSIS — I10 Essential (primary) hypertension: Secondary | ICD-10-CM | POA: Diagnosis not present

## 2017-03-29 DIAGNOSIS — N390 Urinary tract infection, site not specified: Secondary | ICD-10-CM | POA: Diagnosis not present

## 2017-03-29 DIAGNOSIS — E039 Hypothyroidism, unspecified: Secondary | ICD-10-CM | POA: Diagnosis not present

## 2017-03-29 DIAGNOSIS — E1149 Type 2 diabetes mellitus with other diabetic neurological complication: Secondary | ICD-10-CM | POA: Diagnosis not present

## 2017-04-01 DIAGNOSIS — R69 Illness, unspecified: Secondary | ICD-10-CM | POA: Diagnosis not present

## 2017-04-12 DIAGNOSIS — I5022 Chronic systolic (congestive) heart failure: Secondary | ICD-10-CM | POA: Diagnosis not present

## 2017-04-12 DIAGNOSIS — J449 Chronic obstructive pulmonary disease, unspecified: Secondary | ICD-10-CM | POA: Diagnosis not present

## 2017-04-12 DIAGNOSIS — R69 Illness, unspecified: Secondary | ICD-10-CM | POA: Diagnosis not present

## 2017-04-12 DIAGNOSIS — R269 Unspecified abnormalities of gait and mobility: Secondary | ICD-10-CM | POA: Diagnosis not present

## 2017-04-12 DIAGNOSIS — R296 Repeated falls: Secondary | ICD-10-CM | POA: Diagnosis not present

## 2017-04-16 DIAGNOSIS — J449 Chronic obstructive pulmonary disease, unspecified: Secondary | ICD-10-CM | POA: Diagnosis not present

## 2017-04-28 DIAGNOSIS — E559 Vitamin D deficiency, unspecified: Secondary | ICD-10-CM | POA: Diagnosis not present

## 2017-04-28 DIAGNOSIS — E119 Type 2 diabetes mellitus without complications: Secondary | ICD-10-CM | POA: Diagnosis not present

## 2017-04-28 DIAGNOSIS — D518 Other vitamin B12 deficiency anemias: Secondary | ICD-10-CM | POA: Diagnosis not present

## 2017-04-28 DIAGNOSIS — Z79899 Other long term (current) drug therapy: Secondary | ICD-10-CM | POA: Diagnosis not present

## 2017-04-28 DIAGNOSIS — E7849 Other hyperlipidemia: Secondary | ICD-10-CM | POA: Diagnosis not present

## 2017-04-28 DIAGNOSIS — E038 Other specified hypothyroidism: Secondary | ICD-10-CM | POA: Diagnosis not present

## 2017-05-13 DIAGNOSIS — R269 Unspecified abnormalities of gait and mobility: Secondary | ICD-10-CM | POA: Diagnosis not present

## 2017-05-13 DIAGNOSIS — J449 Chronic obstructive pulmonary disease, unspecified: Secondary | ICD-10-CM | POA: Diagnosis not present

## 2017-05-15 DIAGNOSIS — E119 Type 2 diabetes mellitus without complications: Secondary | ICD-10-CM | POA: Diagnosis not present

## 2017-05-17 DIAGNOSIS — J449 Chronic obstructive pulmonary disease, unspecified: Secondary | ICD-10-CM | POA: Diagnosis not present

## 2017-05-25 DIAGNOSIS — R32 Unspecified urinary incontinence: Secondary | ICD-10-CM | POA: Diagnosis not present

## 2017-06-12 DIAGNOSIS — R269 Unspecified abnormalities of gait and mobility: Secondary | ICD-10-CM | POA: Diagnosis not present

## 2017-06-12 DIAGNOSIS — J449 Chronic obstructive pulmonary disease, unspecified: Secondary | ICD-10-CM | POA: Diagnosis not present

## 2017-06-16 DIAGNOSIS — J449 Chronic obstructive pulmonary disease, unspecified: Secondary | ICD-10-CM | POA: Diagnosis not present

## 2017-06-18 DIAGNOSIS — R42 Dizziness and giddiness: Secondary | ICD-10-CM | POA: Diagnosis not present

## 2017-06-18 DIAGNOSIS — S0003XA Contusion of scalp, initial encounter: Secondary | ICD-10-CM | POA: Diagnosis not present

## 2017-06-18 DIAGNOSIS — S0990XA Unspecified injury of head, initial encounter: Secondary | ICD-10-CM | POA: Diagnosis not present

## 2017-06-18 DIAGNOSIS — W1830XA Fall on same level, unspecified, initial encounter: Secondary | ICD-10-CM | POA: Diagnosis not present

## 2017-06-19 DIAGNOSIS — G8911 Acute pain due to trauma: Secondary | ICD-10-CM | POA: Diagnosis not present

## 2017-06-19 DIAGNOSIS — R4182 Altered mental status, unspecified: Secondary | ICD-10-CM | POA: Diagnosis not present

## 2017-06-23 DIAGNOSIS — R32 Unspecified urinary incontinence: Secondary | ICD-10-CM | POA: Diagnosis not present

## 2017-07-13 DIAGNOSIS — J449 Chronic obstructive pulmonary disease, unspecified: Secondary | ICD-10-CM | POA: Diagnosis not present

## 2017-07-13 DIAGNOSIS — R269 Unspecified abnormalities of gait and mobility: Secondary | ICD-10-CM | POA: Diagnosis not present

## 2017-07-17 DIAGNOSIS — J449 Chronic obstructive pulmonary disease, unspecified: Secondary | ICD-10-CM | POA: Diagnosis not present

## 2017-07-22 DIAGNOSIS — R32 Unspecified urinary incontinence: Secondary | ICD-10-CM | POA: Diagnosis not present

## 2017-08-11 DIAGNOSIS — J449 Chronic obstructive pulmonary disease, unspecified: Secondary | ICD-10-CM | POA: Diagnosis not present

## 2017-08-11 DIAGNOSIS — B9789 Other viral agents as the cause of diseases classified elsewhere: Secondary | ICD-10-CM | POA: Diagnosis not present

## 2017-08-11 DIAGNOSIS — J069 Acute upper respiratory infection, unspecified: Secondary | ICD-10-CM | POA: Diagnosis not present

## 2017-08-11 DIAGNOSIS — Z6825 Body mass index (BMI) 25.0-25.9, adult: Secondary | ICD-10-CM | POA: Diagnosis not present

## 2017-08-11 DIAGNOSIS — I5022 Chronic systolic (congestive) heart failure: Secondary | ICD-10-CM | POA: Diagnosis not present

## 2017-08-13 DIAGNOSIS — J449 Chronic obstructive pulmonary disease, unspecified: Secondary | ICD-10-CM | POA: Diagnosis not present

## 2017-08-13 DIAGNOSIS — R269 Unspecified abnormalities of gait and mobility: Secondary | ICD-10-CM | POA: Diagnosis not present

## 2017-08-14 DIAGNOSIS — E119 Type 2 diabetes mellitus without complications: Secondary | ICD-10-CM | POA: Diagnosis not present

## 2017-08-17 DIAGNOSIS — J449 Chronic obstructive pulmonary disease, unspecified: Secondary | ICD-10-CM | POA: Diagnosis not present

## 2017-08-18 DIAGNOSIS — I5022 Chronic systolic (congestive) heart failure: Secondary | ICD-10-CM | POA: Diagnosis not present

## 2017-08-18 DIAGNOSIS — E663 Overweight: Secondary | ICD-10-CM | POA: Diagnosis not present

## 2017-08-18 DIAGNOSIS — R05 Cough: Secondary | ICD-10-CM | POA: Diagnosis not present

## 2017-08-18 DIAGNOSIS — E039 Hypothyroidism, unspecified: Secondary | ICD-10-CM | POA: Diagnosis not present

## 2017-08-18 DIAGNOSIS — E1149 Type 2 diabetes mellitus with other diabetic neurological complication: Secondary | ICD-10-CM | POA: Diagnosis not present

## 2017-08-18 DIAGNOSIS — K219 Gastro-esophageal reflux disease without esophagitis: Secondary | ICD-10-CM | POA: Diagnosis not present

## 2017-08-18 DIAGNOSIS — Z6825 Body mass index (BMI) 25.0-25.9, adult: Secondary | ICD-10-CM | POA: Diagnosis not present

## 2017-08-18 DIAGNOSIS — J449 Chronic obstructive pulmonary disease, unspecified: Secondary | ICD-10-CM | POA: Diagnosis not present

## 2017-08-18 DIAGNOSIS — I10 Essential (primary) hypertension: Secondary | ICD-10-CM | POA: Diagnosis not present

## 2017-08-18 DIAGNOSIS — E785 Hyperlipidemia, unspecified: Secondary | ICD-10-CM | POA: Diagnosis not present

## 2017-08-19 DIAGNOSIS — R32 Unspecified urinary incontinence: Secondary | ICD-10-CM | POA: Diagnosis not present

## 2017-09-10 DIAGNOSIS — R269 Unspecified abnormalities of gait and mobility: Secondary | ICD-10-CM | POA: Diagnosis not present

## 2017-09-10 DIAGNOSIS — J449 Chronic obstructive pulmonary disease, unspecified: Secondary | ICD-10-CM | POA: Diagnosis not present

## 2017-09-14 DIAGNOSIS — J449 Chronic obstructive pulmonary disease, unspecified: Secondary | ICD-10-CM | POA: Diagnosis not present

## 2017-09-16 DIAGNOSIS — R32 Unspecified urinary incontinence: Secondary | ICD-10-CM | POA: Diagnosis not present

## 2017-10-15 DIAGNOSIS — J449 Chronic obstructive pulmonary disease, unspecified: Secondary | ICD-10-CM | POA: Diagnosis not present

## 2017-10-18 DIAGNOSIS — R32 Unspecified urinary incontinence: Secondary | ICD-10-CM | POA: Diagnosis not present

## 2017-11-01 DIAGNOSIS — M542 Cervicalgia: Secondary | ICD-10-CM | POA: Diagnosis not present

## 2017-11-01 DIAGNOSIS — S199XXA Unspecified injury of neck, initial encounter: Secondary | ICD-10-CM | POA: Diagnosis not present

## 2017-11-01 DIAGNOSIS — S0190XA Unspecified open wound of unspecified part of head, initial encounter: Secondary | ICD-10-CM | POA: Diagnosis not present

## 2017-11-01 DIAGNOSIS — M25561 Pain in right knee: Secondary | ICD-10-CM | POA: Diagnosis not present

## 2017-11-01 DIAGNOSIS — I110 Hypertensive heart disease with heart failure: Secondary | ICD-10-CM | POA: Diagnosis not present

## 2017-11-01 DIAGNOSIS — E119 Type 2 diabetes mellitus without complications: Secondary | ICD-10-CM | POA: Diagnosis not present

## 2017-11-01 DIAGNOSIS — S0990XA Unspecified injury of head, initial encounter: Secondary | ICD-10-CM | POA: Diagnosis not present

## 2017-11-01 DIAGNOSIS — I509 Heart failure, unspecified: Secondary | ICD-10-CM | POA: Diagnosis not present

## 2017-11-01 DIAGNOSIS — S098XXA Other specified injuries of head, initial encounter: Secondary | ICD-10-CM | POA: Diagnosis not present

## 2017-11-01 DIAGNOSIS — W1830XA Fall on same level, unspecified, initial encounter: Secondary | ICD-10-CM | POA: Diagnosis not present

## 2017-11-01 DIAGNOSIS — R531 Weakness: Secondary | ICD-10-CM | POA: Diagnosis not present

## 2017-11-01 DIAGNOSIS — J449 Chronic obstructive pulmonary disease, unspecified: Secondary | ICD-10-CM | POA: Diagnosis not present

## 2017-11-01 DIAGNOSIS — S81011A Laceration without foreign body, right knee, initial encounter: Secondary | ICD-10-CM | POA: Diagnosis not present

## 2017-11-01 DIAGNOSIS — R51 Headache: Secondary | ICD-10-CM | POA: Diagnosis not present

## 2017-11-01 DIAGNOSIS — Z8673 Personal history of transient ischemic attack (TIA), and cerebral infarction without residual deficits: Secondary | ICD-10-CM | POA: Diagnosis not present

## 2017-11-13 DIAGNOSIS — E119 Type 2 diabetes mellitus without complications: Secondary | ICD-10-CM | POA: Diagnosis not present

## 2017-11-14 DIAGNOSIS — J449 Chronic obstructive pulmonary disease, unspecified: Secondary | ICD-10-CM | POA: Diagnosis not present

## 2017-11-18 DIAGNOSIS — R32 Unspecified urinary incontinence: Secondary | ICD-10-CM | POA: Diagnosis not present

## 2017-12-06 DIAGNOSIS — Z79899 Other long term (current) drug therapy: Secondary | ICD-10-CM | POA: Diagnosis not present

## 2017-12-06 DIAGNOSIS — E785 Hyperlipidemia, unspecified: Secondary | ICD-10-CM | POA: Diagnosis not present

## 2017-12-06 DIAGNOSIS — Z0000 Encounter for general adult medical examination without abnormal findings: Secondary | ICD-10-CM | POA: Diagnosis not present

## 2017-12-06 DIAGNOSIS — E1149 Type 2 diabetes mellitus with other diabetic neurological complication: Secondary | ICD-10-CM | POA: Diagnosis not present

## 2017-12-06 DIAGNOSIS — J449 Chronic obstructive pulmonary disease, unspecified: Secondary | ICD-10-CM | POA: Diagnosis not present

## 2017-12-06 DIAGNOSIS — K219 Gastro-esophageal reflux disease without esophagitis: Secondary | ICD-10-CM | POA: Diagnosis not present

## 2017-12-06 DIAGNOSIS — D510 Vitamin B12 deficiency anemia due to intrinsic factor deficiency: Secondary | ICD-10-CM | POA: Diagnosis not present

## 2017-12-06 DIAGNOSIS — E039 Hypothyroidism, unspecified: Secondary | ICD-10-CM | POA: Diagnosis not present

## 2017-12-06 DIAGNOSIS — R69 Illness, unspecified: Secondary | ICD-10-CM | POA: Diagnosis not present

## 2017-12-06 DIAGNOSIS — I10 Essential (primary) hypertension: Secondary | ICD-10-CM | POA: Diagnosis not present

## 2017-12-06 DIAGNOSIS — I5022 Chronic systolic (congestive) heart failure: Secondary | ICD-10-CM | POA: Diagnosis not present

## 2017-12-15 DIAGNOSIS — J449 Chronic obstructive pulmonary disease, unspecified: Secondary | ICD-10-CM | POA: Diagnosis not present

## 2017-12-16 DIAGNOSIS — R32 Unspecified urinary incontinence: Secondary | ICD-10-CM | POA: Diagnosis not present

## 2017-12-28 DIAGNOSIS — J449 Chronic obstructive pulmonary disease, unspecified: Secondary | ICD-10-CM | POA: Diagnosis not present

## 2018-01-10 DIAGNOSIS — J441 Chronic obstructive pulmonary disease with (acute) exacerbation: Secondary | ICD-10-CM | POA: Diagnosis not present

## 2018-01-10 DIAGNOSIS — J449 Chronic obstructive pulmonary disease, unspecified: Secondary | ICD-10-CM | POA: Diagnosis not present

## 2018-01-14 DIAGNOSIS — J449 Chronic obstructive pulmonary disease, unspecified: Secondary | ICD-10-CM | POA: Diagnosis not present

## 2018-01-19 DIAGNOSIS — R32 Unspecified urinary incontinence: Secondary | ICD-10-CM | POA: Diagnosis not present

## 2018-01-28 DIAGNOSIS — J449 Chronic obstructive pulmonary disease, unspecified: Secondary | ICD-10-CM | POA: Diagnosis not present

## 2018-02-10 DIAGNOSIS — J449 Chronic obstructive pulmonary disease, unspecified: Secondary | ICD-10-CM | POA: Diagnosis not present

## 2018-02-10 DIAGNOSIS — J441 Chronic obstructive pulmonary disease with (acute) exacerbation: Secondary | ICD-10-CM | POA: Diagnosis not present

## 2018-02-12 DIAGNOSIS — E119 Type 2 diabetes mellitus without complications: Secondary | ICD-10-CM | POA: Diagnosis not present

## 2018-02-14 DIAGNOSIS — J449 Chronic obstructive pulmonary disease, unspecified: Secondary | ICD-10-CM | POA: Diagnosis not present

## 2018-02-17 DIAGNOSIS — R32 Unspecified urinary incontinence: Secondary | ICD-10-CM | POA: Diagnosis not present

## 2018-02-28 DIAGNOSIS — J449 Chronic obstructive pulmonary disease, unspecified: Secondary | ICD-10-CM | POA: Diagnosis not present

## 2018-03-13 DIAGNOSIS — J449 Chronic obstructive pulmonary disease, unspecified: Secondary | ICD-10-CM | POA: Diagnosis not present

## 2018-03-13 DIAGNOSIS — J441 Chronic obstructive pulmonary disease with (acute) exacerbation: Secondary | ICD-10-CM | POA: Diagnosis not present

## 2018-03-17 DIAGNOSIS — J449 Chronic obstructive pulmonary disease, unspecified: Secondary | ICD-10-CM | POA: Diagnosis not present

## 2018-03-17 DIAGNOSIS — R32 Unspecified urinary incontinence: Secondary | ICD-10-CM | POA: Diagnosis not present

## 2018-03-30 DIAGNOSIS — J449 Chronic obstructive pulmonary disease, unspecified: Secondary | ICD-10-CM | POA: Diagnosis not present

## 2018-04-07 DIAGNOSIS — R69 Illness, unspecified: Secondary | ICD-10-CM | POA: Diagnosis not present

## 2018-04-12 DIAGNOSIS — J441 Chronic obstructive pulmonary disease with (acute) exacerbation: Secondary | ICD-10-CM | POA: Diagnosis not present

## 2018-04-12 DIAGNOSIS — J449 Chronic obstructive pulmonary disease, unspecified: Secondary | ICD-10-CM | POA: Diagnosis not present

## 2018-04-14 DIAGNOSIS — Z961 Presence of intraocular lens: Secondary | ICD-10-CM | POA: Diagnosis not present

## 2018-04-14 DIAGNOSIS — E119 Type 2 diabetes mellitus without complications: Secondary | ICD-10-CM | POA: Diagnosis not present

## 2018-04-16 DIAGNOSIS — J449 Chronic obstructive pulmonary disease, unspecified: Secondary | ICD-10-CM | POA: Diagnosis not present

## 2018-04-19 DIAGNOSIS — R32 Unspecified urinary incontinence: Secondary | ICD-10-CM | POA: Diagnosis not present

## 2018-04-30 DIAGNOSIS — J449 Chronic obstructive pulmonary disease, unspecified: Secondary | ICD-10-CM | POA: Diagnosis not present

## 2018-05-13 DIAGNOSIS — J441 Chronic obstructive pulmonary disease with (acute) exacerbation: Secondary | ICD-10-CM | POA: Diagnosis not present

## 2018-05-13 DIAGNOSIS — J449 Chronic obstructive pulmonary disease, unspecified: Secondary | ICD-10-CM | POA: Diagnosis not present

## 2018-05-17 DIAGNOSIS — J449 Chronic obstructive pulmonary disease, unspecified: Secondary | ICD-10-CM | POA: Diagnosis not present

## 2018-05-19 DIAGNOSIS — R32 Unspecified urinary incontinence: Secondary | ICD-10-CM | POA: Diagnosis not present

## 2018-05-30 DIAGNOSIS — J449 Chronic obstructive pulmonary disease, unspecified: Secondary | ICD-10-CM | POA: Diagnosis not present

## 2018-06-12 DIAGNOSIS — J441 Chronic obstructive pulmonary disease with (acute) exacerbation: Secondary | ICD-10-CM | POA: Diagnosis not present

## 2018-06-12 DIAGNOSIS — J449 Chronic obstructive pulmonary disease, unspecified: Secondary | ICD-10-CM | POA: Diagnosis not present

## 2018-06-15 DIAGNOSIS — R32 Unspecified urinary incontinence: Secondary | ICD-10-CM | POA: Diagnosis not present

## 2018-06-16 DIAGNOSIS — J449 Chronic obstructive pulmonary disease, unspecified: Secondary | ICD-10-CM | POA: Diagnosis not present

## 2018-06-30 DIAGNOSIS — J449 Chronic obstructive pulmonary disease, unspecified: Secondary | ICD-10-CM | POA: Diagnosis not present

## 2018-07-11 DIAGNOSIS — R609 Edema, unspecified: Secondary | ICD-10-CM | POA: Diagnosis not present

## 2018-07-11 DIAGNOSIS — S01112A Laceration without foreign body of left eyelid and periocular area, initial encounter: Secondary | ICD-10-CM | POA: Diagnosis not present

## 2018-07-11 DIAGNOSIS — R58 Hemorrhage, not elsewhere classified: Secondary | ICD-10-CM | POA: Diagnosis not present

## 2018-07-11 DIAGNOSIS — R05 Cough: Secondary | ICD-10-CM | POA: Diagnosis not present

## 2018-07-11 DIAGNOSIS — I10 Essential (primary) hypertension: Secondary | ICD-10-CM | POA: Diagnosis not present

## 2018-07-11 DIAGNOSIS — Z23 Encounter for immunization: Secondary | ICD-10-CM | POA: Diagnosis not present

## 2018-07-11 DIAGNOSIS — W19XXXA Unspecified fall, initial encounter: Secondary | ICD-10-CM | POA: Diagnosis not present

## 2018-07-11 DIAGNOSIS — R0902 Hypoxemia: Secondary | ICD-10-CM | POA: Diagnosis not present

## 2018-07-11 DIAGNOSIS — W010XXA Fall on same level from slipping, tripping and stumbling without subsequent striking against object, initial encounter: Secondary | ICD-10-CM | POA: Diagnosis not present

## 2018-07-13 DIAGNOSIS — J441 Chronic obstructive pulmonary disease with (acute) exacerbation: Secondary | ICD-10-CM | POA: Diagnosis not present

## 2018-07-13 DIAGNOSIS — J449 Chronic obstructive pulmonary disease, unspecified: Secondary | ICD-10-CM | POA: Diagnosis not present

## 2018-07-17 DIAGNOSIS — J449 Chronic obstructive pulmonary disease, unspecified: Secondary | ICD-10-CM | POA: Diagnosis not present

## 2018-07-20 DIAGNOSIS — R32 Unspecified urinary incontinence: Secondary | ICD-10-CM | POA: Diagnosis not present

## 2018-07-31 DIAGNOSIS — J449 Chronic obstructive pulmonary disease, unspecified: Secondary | ICD-10-CM | POA: Diagnosis not present

## 2018-08-13 DIAGNOSIS — J441 Chronic obstructive pulmonary disease with (acute) exacerbation: Secondary | ICD-10-CM | POA: Diagnosis not present

## 2018-08-13 DIAGNOSIS — J449 Chronic obstructive pulmonary disease, unspecified: Secondary | ICD-10-CM | POA: Diagnosis not present

## 2018-08-17 DIAGNOSIS — J449 Chronic obstructive pulmonary disease, unspecified: Secondary | ICD-10-CM | POA: Diagnosis not present

## 2018-08-22 DIAGNOSIS — R32 Unspecified urinary incontinence: Secondary | ICD-10-CM | POA: Diagnosis not present

## 2018-08-27 DIAGNOSIS — R69 Illness, unspecified: Secondary | ICD-10-CM | POA: Diagnosis not present

## 2018-08-29 DIAGNOSIS — J449 Chronic obstructive pulmonary disease, unspecified: Secondary | ICD-10-CM | POA: Diagnosis not present

## 2018-09-11 DIAGNOSIS — J441 Chronic obstructive pulmonary disease with (acute) exacerbation: Secondary | ICD-10-CM | POA: Diagnosis not present

## 2018-09-11 DIAGNOSIS — J449 Chronic obstructive pulmonary disease, unspecified: Secondary | ICD-10-CM | POA: Diagnosis not present

## 2018-09-15 DIAGNOSIS — J449 Chronic obstructive pulmonary disease, unspecified: Secondary | ICD-10-CM | POA: Diagnosis not present

## 2018-09-19 DIAGNOSIS — R32 Unspecified urinary incontinence: Secondary | ICD-10-CM | POA: Diagnosis not present

## 2018-09-29 DIAGNOSIS — J449 Chronic obstructive pulmonary disease, unspecified: Secondary | ICD-10-CM | POA: Diagnosis not present

## 2018-10-02 DIAGNOSIS — R69 Illness, unspecified: Secondary | ICD-10-CM | POA: Diagnosis not present

## 2018-10-12 DIAGNOSIS — J449 Chronic obstructive pulmonary disease, unspecified: Secondary | ICD-10-CM | POA: Diagnosis not present

## 2018-10-12 DIAGNOSIS — J441 Chronic obstructive pulmonary disease with (acute) exacerbation: Secondary | ICD-10-CM | POA: Diagnosis not present

## 2018-10-16 DIAGNOSIS — J449 Chronic obstructive pulmonary disease, unspecified: Secondary | ICD-10-CM | POA: Diagnosis not present

## 2018-10-17 DIAGNOSIS — R32 Unspecified urinary incontinence: Secondary | ICD-10-CM | POA: Diagnosis not present

## 2018-11-11 DIAGNOSIS — J449 Chronic obstructive pulmonary disease, unspecified: Secondary | ICD-10-CM | POA: Diagnosis not present

## 2018-11-11 DIAGNOSIS — J441 Chronic obstructive pulmonary disease with (acute) exacerbation: Secondary | ICD-10-CM | POA: Diagnosis not present

## 2018-11-15 DIAGNOSIS — J449 Chronic obstructive pulmonary disease, unspecified: Secondary | ICD-10-CM | POA: Diagnosis not present

## 2018-11-17 DIAGNOSIS — R32 Unspecified urinary incontinence: Secondary | ICD-10-CM | POA: Diagnosis not present

## 2018-12-12 DIAGNOSIS — J449 Chronic obstructive pulmonary disease, unspecified: Secondary | ICD-10-CM | POA: Diagnosis not present

## 2018-12-12 DIAGNOSIS — J441 Chronic obstructive pulmonary disease with (acute) exacerbation: Secondary | ICD-10-CM | POA: Diagnosis not present

## 2018-12-16 DIAGNOSIS — J449 Chronic obstructive pulmonary disease, unspecified: Secondary | ICD-10-CM | POA: Diagnosis not present

## 2018-12-26 DIAGNOSIS — Z20828 Contact with and (suspected) exposure to other viral communicable diseases: Secondary | ICD-10-CM | POA: Diagnosis not present

## 2018-12-26 DIAGNOSIS — Z03818 Encounter for observation for suspected exposure to other biological agents ruled out: Secondary | ICD-10-CM | POA: Diagnosis not present

## 2019-01-11 DIAGNOSIS — J449 Chronic obstructive pulmonary disease, unspecified: Secondary | ICD-10-CM | POA: Diagnosis not present

## 2019-01-11 DIAGNOSIS — J441 Chronic obstructive pulmonary disease with (acute) exacerbation: Secondary | ICD-10-CM | POA: Diagnosis not present

## 2019-01-15 DIAGNOSIS — J449 Chronic obstructive pulmonary disease, unspecified: Secondary | ICD-10-CM | POA: Diagnosis not present

## 2019-02-11 DIAGNOSIS — J441 Chronic obstructive pulmonary disease with (acute) exacerbation: Secondary | ICD-10-CM | POA: Diagnosis not present

## 2019-02-11 DIAGNOSIS — J449 Chronic obstructive pulmonary disease, unspecified: Secondary | ICD-10-CM | POA: Diagnosis not present

## 2019-02-15 DIAGNOSIS — J449 Chronic obstructive pulmonary disease, unspecified: Secondary | ICD-10-CM | POA: Diagnosis not present

## 2019-03-14 DIAGNOSIS — J441 Chronic obstructive pulmonary disease with (acute) exacerbation: Secondary | ICD-10-CM | POA: Diagnosis not present

## 2019-03-14 DIAGNOSIS — J449 Chronic obstructive pulmonary disease, unspecified: Secondary | ICD-10-CM | POA: Diagnosis not present

## 2019-03-18 DIAGNOSIS — J449 Chronic obstructive pulmonary disease, unspecified: Secondary | ICD-10-CM | POA: Diagnosis not present

## 2019-03-23 DIAGNOSIS — R32 Unspecified urinary incontinence: Secondary | ICD-10-CM | POA: Diagnosis not present

## 2019-04-03 DIAGNOSIS — R69 Illness, unspecified: Secondary | ICD-10-CM | POA: Diagnosis not present

## 2019-04-13 DIAGNOSIS — J441 Chronic obstructive pulmonary disease with (acute) exacerbation: Secondary | ICD-10-CM | POA: Diagnosis not present

## 2019-04-13 DIAGNOSIS — J449 Chronic obstructive pulmonary disease, unspecified: Secondary | ICD-10-CM | POA: Diagnosis not present

## 2019-04-17 DIAGNOSIS — J449 Chronic obstructive pulmonary disease, unspecified: Secondary | ICD-10-CM | POA: Diagnosis not present

## 2019-04-24 DIAGNOSIS — R32 Unspecified urinary incontinence: Secondary | ICD-10-CM | POA: Diagnosis not present

## 2019-05-10 DIAGNOSIS — Z743 Need for continuous supervision: Secondary | ICD-10-CM | POA: Diagnosis not present

## 2019-05-10 DIAGNOSIS — R410 Disorientation, unspecified: Secondary | ICD-10-CM | POA: Diagnosis not present

## 2019-05-10 DIAGNOSIS — R279 Unspecified lack of coordination: Secondary | ICD-10-CM | POA: Diagnosis not present

## 2019-05-10 DIAGNOSIS — I10 Essential (primary) hypertension: Secondary | ICD-10-CM | POA: Diagnosis not present

## 2019-05-10 DIAGNOSIS — R52 Pain, unspecified: Secondary | ICD-10-CM | POA: Diagnosis not present

## 2019-05-10 DIAGNOSIS — W010XXA Fall on same level from slipping, tripping and stumbling without subsequent striking against object, initial encounter: Secondary | ICD-10-CM | POA: Diagnosis not present

## 2019-05-10 DIAGNOSIS — S199XXA Unspecified injury of neck, initial encounter: Secondary | ICD-10-CM | POA: Diagnosis not present

## 2019-05-10 DIAGNOSIS — W19XXXA Unspecified fall, initial encounter: Secondary | ICD-10-CM | POA: Diagnosis not present

## 2019-05-10 DIAGNOSIS — M25561 Pain in right knee: Secondary | ICD-10-CM | POA: Diagnosis not present

## 2019-05-10 DIAGNOSIS — S0083XA Contusion of other part of head, initial encounter: Secondary | ICD-10-CM | POA: Diagnosis not present

## 2019-05-10 DIAGNOSIS — R5381 Other malaise: Secondary | ICD-10-CM | POA: Diagnosis not present

## 2019-05-10 DIAGNOSIS — S0003XA Contusion of scalp, initial encounter: Secondary | ICD-10-CM | POA: Diagnosis not present

## 2019-05-14 DIAGNOSIS — J441 Chronic obstructive pulmonary disease with (acute) exacerbation: Secondary | ICD-10-CM | POA: Diagnosis not present

## 2019-05-14 DIAGNOSIS — J449 Chronic obstructive pulmonary disease, unspecified: Secondary | ICD-10-CM | POA: Diagnosis not present

## 2019-05-18 DIAGNOSIS — R32 Unspecified urinary incontinence: Secondary | ICD-10-CM | POA: Diagnosis not present

## 2019-05-18 DIAGNOSIS — J449 Chronic obstructive pulmonary disease, unspecified: Secondary | ICD-10-CM | POA: Diagnosis not present

## 2019-05-28 DIAGNOSIS — S61201A Unspecified open wound of left index finger without damage to nail, initial encounter: Secondary | ICD-10-CM | POA: Diagnosis not present

## 2019-05-28 DIAGNOSIS — R279 Unspecified lack of coordination: Secondary | ICD-10-CM | POA: Diagnosis not present

## 2019-05-28 DIAGNOSIS — R5381 Other malaise: Secondary | ICD-10-CM | POA: Diagnosis not present

## 2019-05-28 DIAGNOSIS — S61211A Laceration without foreign body of left index finger without damage to nail, initial encounter: Secondary | ICD-10-CM | POA: Diagnosis not present

## 2019-05-28 DIAGNOSIS — W19XXXA Unspecified fall, initial encounter: Secondary | ICD-10-CM | POA: Diagnosis not present

## 2019-05-28 DIAGNOSIS — R001 Bradycardia, unspecified: Secondary | ICD-10-CM | POA: Diagnosis not present

## 2019-05-28 DIAGNOSIS — R0902 Hypoxemia: Secondary | ICD-10-CM | POA: Diagnosis not present

## 2019-05-28 DIAGNOSIS — Z743 Need for continuous supervision: Secondary | ICD-10-CM | POA: Diagnosis not present

## 2019-06-05 DIAGNOSIS — N390 Urinary tract infection, site not specified: Secondary | ICD-10-CM | POA: Diagnosis not present

## 2019-06-13 DIAGNOSIS — J441 Chronic obstructive pulmonary disease with (acute) exacerbation: Secondary | ICD-10-CM | POA: Diagnosis not present

## 2019-06-13 DIAGNOSIS — J449 Chronic obstructive pulmonary disease, unspecified: Secondary | ICD-10-CM | POA: Diagnosis not present

## 2019-06-17 DIAGNOSIS — J449 Chronic obstructive pulmonary disease, unspecified: Secondary | ICD-10-CM | POA: Diagnosis not present

## 2019-06-20 DIAGNOSIS — R32 Unspecified urinary incontinence: Secondary | ICD-10-CM | POA: Diagnosis not present

## 2019-07-12 DIAGNOSIS — Z03818 Encounter for observation for suspected exposure to other biological agents ruled out: Secondary | ICD-10-CM | POA: Diagnosis not present

## 2019-07-14 DIAGNOSIS — J449 Chronic obstructive pulmonary disease, unspecified: Secondary | ICD-10-CM | POA: Diagnosis not present

## 2019-07-14 DIAGNOSIS — J441 Chronic obstructive pulmonary disease with (acute) exacerbation: Secondary | ICD-10-CM | POA: Diagnosis not present

## 2019-07-18 DIAGNOSIS — J449 Chronic obstructive pulmonary disease, unspecified: Secondary | ICD-10-CM | POA: Diagnosis not present

## 2019-07-20 DIAGNOSIS — Z03818 Encounter for observation for suspected exposure to other biological agents ruled out: Secondary | ICD-10-CM | POA: Diagnosis not present

## 2019-07-20 DIAGNOSIS — R32 Unspecified urinary incontinence: Secondary | ICD-10-CM | POA: Diagnosis not present

## 2019-07-20 DIAGNOSIS — E119 Type 2 diabetes mellitus without complications: Secondary | ICD-10-CM | POA: Diagnosis not present

## 2019-07-24 DIAGNOSIS — Z03818 Encounter for observation for suspected exposure to other biological agents ruled out: Secondary | ICD-10-CM | POA: Diagnosis not present

## 2019-07-27 DIAGNOSIS — N390 Urinary tract infection, site not specified: Secondary | ICD-10-CM | POA: Diagnosis not present

## 2019-07-29 DIAGNOSIS — W19XXXA Unspecified fall, initial encounter: Secondary | ICD-10-CM | POA: Diagnosis not present

## 2019-07-29 DIAGNOSIS — I10 Essential (primary) hypertension: Secondary | ICD-10-CM | POA: Diagnosis not present

## 2019-07-29 DIAGNOSIS — M542 Cervicalgia: Secondary | ICD-10-CM | POA: Diagnosis not present

## 2019-07-29 DIAGNOSIS — R531 Weakness: Secondary | ICD-10-CM | POA: Diagnosis not present

## 2019-07-29 DIAGNOSIS — S0990XA Unspecified injury of head, initial encounter: Secondary | ICD-10-CM | POA: Diagnosis not present

## 2019-07-29 DIAGNOSIS — S199XXA Unspecified injury of neck, initial encounter: Secondary | ICD-10-CM | POA: Diagnosis not present

## 2019-07-29 DIAGNOSIS — R279 Unspecified lack of coordination: Secondary | ICD-10-CM | POA: Diagnosis not present

## 2019-07-29 DIAGNOSIS — R220 Localized swelling, mass and lump, head: Secondary | ICD-10-CM | POA: Diagnosis not present

## 2019-07-29 DIAGNOSIS — S064X0A Epidural hemorrhage without loss of consciousness, initial encounter: Secondary | ICD-10-CM | POA: Diagnosis not present

## 2019-07-29 DIAGNOSIS — W010XXA Fall on same level from slipping, tripping and stumbling without subsequent striking against object, initial encounter: Secondary | ICD-10-CM | POA: Diagnosis not present

## 2019-07-29 DIAGNOSIS — Z743 Need for continuous supervision: Secondary | ICD-10-CM | POA: Diagnosis not present

## 2019-07-29 DIAGNOSIS — S0003XA Contusion of scalp, initial encounter: Secondary | ICD-10-CM | POA: Diagnosis not present

## 2019-07-29 DIAGNOSIS — R519 Headache, unspecified: Secondary | ICD-10-CM | POA: Diagnosis not present

## 2019-07-31 DIAGNOSIS — Z03818 Encounter for observation for suspected exposure to other biological agents ruled out: Secondary | ICD-10-CM | POA: Diagnosis not present

## 2019-08-07 DIAGNOSIS — Z03818 Encounter for observation for suspected exposure to other biological agents ruled out: Secondary | ICD-10-CM | POA: Diagnosis not present

## 2019-08-09 DIAGNOSIS — E039 Hypothyroidism, unspecified: Secondary | ICD-10-CM | POA: Diagnosis not present

## 2019-08-09 DIAGNOSIS — I679 Cerebrovascular disease, unspecified: Secondary | ICD-10-CM | POA: Diagnosis not present

## 2019-08-09 DIAGNOSIS — I10 Essential (primary) hypertension: Secondary | ICD-10-CM | POA: Diagnosis not present

## 2019-08-09 DIAGNOSIS — J329 Chronic sinusitis, unspecified: Secondary | ICD-10-CM | POA: Diagnosis not present

## 2019-08-09 DIAGNOSIS — J449 Chronic obstructive pulmonary disease, unspecified: Secondary | ICD-10-CM | POA: Diagnosis not present

## 2019-08-09 DIAGNOSIS — E1149 Type 2 diabetes mellitus with other diabetic neurological complication: Secondary | ICD-10-CM | POA: Diagnosis not present

## 2019-08-09 DIAGNOSIS — J40 Bronchitis, not specified as acute or chronic: Secondary | ICD-10-CM | POA: Diagnosis not present

## 2019-08-09 DIAGNOSIS — Z79899 Other long term (current) drug therapy: Secondary | ICD-10-CM | POA: Diagnosis not present

## 2019-08-09 DIAGNOSIS — E785 Hyperlipidemia, unspecified: Secondary | ICD-10-CM | POA: Diagnosis not present

## 2019-08-09 DIAGNOSIS — Z0000 Encounter for general adult medical examination without abnormal findings: Secondary | ICD-10-CM | POA: Diagnosis not present

## 2019-08-14 DIAGNOSIS — J449 Chronic obstructive pulmonary disease, unspecified: Secondary | ICD-10-CM | POA: Diagnosis not present

## 2019-08-14 DIAGNOSIS — J441 Chronic obstructive pulmonary disease with (acute) exacerbation: Secondary | ICD-10-CM | POA: Diagnosis not present

## 2019-08-18 DIAGNOSIS — J449 Chronic obstructive pulmonary disease, unspecified: Secondary | ICD-10-CM | POA: Diagnosis not present

## 2019-08-21 DIAGNOSIS — E119 Type 2 diabetes mellitus without complications: Secondary | ICD-10-CM | POA: Diagnosis not present

## 2019-08-21 DIAGNOSIS — R32 Unspecified urinary incontinence: Secondary | ICD-10-CM | POA: Diagnosis not present

## 2019-08-21 DIAGNOSIS — Z03818 Encounter for observation for suspected exposure to other biological agents ruled out: Secondary | ICD-10-CM | POA: Diagnosis not present

## 2019-09-04 DIAGNOSIS — Z03818 Encounter for observation for suspected exposure to other biological agents ruled out: Secondary | ICD-10-CM | POA: Diagnosis not present

## 2019-09-11 DIAGNOSIS — J449 Chronic obstructive pulmonary disease, unspecified: Secondary | ICD-10-CM | POA: Diagnosis not present

## 2019-09-11 DIAGNOSIS — J441 Chronic obstructive pulmonary disease with (acute) exacerbation: Secondary | ICD-10-CM | POA: Diagnosis not present

## 2019-09-15 DIAGNOSIS — J449 Chronic obstructive pulmonary disease, unspecified: Secondary | ICD-10-CM | POA: Diagnosis not present

## 2019-09-18 DIAGNOSIS — E119 Type 2 diabetes mellitus without complications: Secondary | ICD-10-CM | POA: Diagnosis not present

## 2019-09-21 DIAGNOSIS — R32 Unspecified urinary incontinence: Secondary | ICD-10-CM | POA: Diagnosis not present

## 2019-10-12 DIAGNOSIS — L84 Corns and callosities: Secondary | ICD-10-CM | POA: Diagnosis not present

## 2019-10-12 DIAGNOSIS — E119 Type 2 diabetes mellitus without complications: Secondary | ICD-10-CM | POA: Diagnosis not present

## 2019-10-12 DIAGNOSIS — M79675 Pain in left toe(s): Secondary | ICD-10-CM | POA: Diagnosis not present

## 2019-10-12 DIAGNOSIS — J441 Chronic obstructive pulmonary disease with (acute) exacerbation: Secondary | ICD-10-CM | POA: Diagnosis not present

## 2019-10-12 DIAGNOSIS — B351 Tinea unguium: Secondary | ICD-10-CM | POA: Diagnosis not present

## 2019-10-12 DIAGNOSIS — J449 Chronic obstructive pulmonary disease, unspecified: Secondary | ICD-10-CM | POA: Diagnosis not present

## 2019-10-16 DIAGNOSIS — J449 Chronic obstructive pulmonary disease, unspecified: Secondary | ICD-10-CM | POA: Diagnosis not present

## 2019-10-19 DIAGNOSIS — E119 Type 2 diabetes mellitus without complications: Secondary | ICD-10-CM | POA: Diagnosis not present

## 2019-10-23 DIAGNOSIS — R32 Unspecified urinary incontinence: Secondary | ICD-10-CM | POA: Diagnosis not present

## 2019-11-11 DIAGNOSIS — J441 Chronic obstructive pulmonary disease with (acute) exacerbation: Secondary | ICD-10-CM | POA: Diagnosis not present

## 2019-11-11 DIAGNOSIS — J449 Chronic obstructive pulmonary disease, unspecified: Secondary | ICD-10-CM | POA: Diagnosis not present

## 2019-11-15 DIAGNOSIS — J449 Chronic obstructive pulmonary disease, unspecified: Secondary | ICD-10-CM | POA: Diagnosis not present

## 2019-11-20 DIAGNOSIS — R32 Unspecified urinary incontinence: Secondary | ICD-10-CM | POA: Diagnosis not present

## 2019-12-12 DIAGNOSIS — J441 Chronic obstructive pulmonary disease with (acute) exacerbation: Secondary | ICD-10-CM | POA: Diagnosis not present

## 2019-12-12 DIAGNOSIS — J449 Chronic obstructive pulmonary disease, unspecified: Secondary | ICD-10-CM | POA: Diagnosis not present

## 2019-12-16 DIAGNOSIS — J449 Chronic obstructive pulmonary disease, unspecified: Secondary | ICD-10-CM | POA: Diagnosis not present

## 2019-12-19 DIAGNOSIS — R32 Unspecified urinary incontinence: Secondary | ICD-10-CM | POA: Diagnosis not present

## 2019-12-19 DIAGNOSIS — E119 Type 2 diabetes mellitus without complications: Secondary | ICD-10-CM | POA: Diagnosis not present

## 2020-01-07 DIAGNOSIS — I959 Hypotension, unspecified: Secondary | ICD-10-CM | POA: Diagnosis not present

## 2020-01-07 DIAGNOSIS — Z743 Need for continuous supervision: Secondary | ICD-10-CM | POA: Diagnosis not present

## 2020-01-07 DIAGNOSIS — S4991XA Unspecified injury of right shoulder and upper arm, initial encounter: Secondary | ICD-10-CM | POA: Diagnosis not present

## 2020-01-07 DIAGNOSIS — R279 Unspecified lack of coordination: Secondary | ICD-10-CM | POA: Diagnosis not present

## 2020-01-07 DIAGNOSIS — W19XXXA Unspecified fall, initial encounter: Secondary | ICD-10-CM | POA: Diagnosis not present

## 2020-01-07 DIAGNOSIS — M25511 Pain in right shoulder: Secondary | ICD-10-CM | POA: Diagnosis not present

## 2020-01-07 DIAGNOSIS — S52124A Nondisplaced fracture of head of right radius, initial encounter for closed fracture: Secondary | ICD-10-CM | POA: Diagnosis not present

## 2020-01-07 DIAGNOSIS — R404 Transient alteration of awareness: Secondary | ICD-10-CM | POA: Diagnosis not present

## 2020-01-07 DIAGNOSIS — R52 Pain, unspecified: Secondary | ICD-10-CM | POA: Diagnosis not present

## 2020-01-07 DIAGNOSIS — M25421 Effusion, right elbow: Secondary | ICD-10-CM | POA: Diagnosis not present

## 2020-01-09 DIAGNOSIS — R0902 Hypoxemia: Secondary | ICD-10-CM | POA: Diagnosis not present

## 2020-01-09 DIAGNOSIS — N179 Acute kidney failure, unspecified: Secondary | ICD-10-CM | POA: Diagnosis not present

## 2020-01-09 DIAGNOSIS — R2241 Localized swelling, mass and lump, right lower limb: Secondary | ICD-10-CM | POA: Diagnosis not present

## 2020-01-09 DIAGNOSIS — M1611 Unilateral primary osteoarthritis, right hip: Secondary | ICD-10-CM | POA: Diagnosis not present

## 2020-01-09 DIAGNOSIS — S72011A Unspecified intracapsular fracture of right femur, initial encounter for closed fracture: Secondary | ICD-10-CM | POA: Diagnosis not present

## 2020-01-09 DIAGNOSIS — I7090 Unspecified atherosclerosis: Secondary | ICD-10-CM | POA: Diagnosis not present

## 2020-01-09 DIAGNOSIS — N19 Unspecified kidney failure: Secondary | ICD-10-CM | POA: Diagnosis not present

## 2020-01-09 DIAGNOSIS — S299XXA Unspecified injury of thorax, initial encounter: Secondary | ICD-10-CM | POA: Diagnosis not present

## 2020-01-09 DIAGNOSIS — I110 Hypertensive heart disease with heart failure: Secondary | ICD-10-CM | POA: Diagnosis not present

## 2020-01-09 DIAGNOSIS — E871 Hypo-osmolality and hyponatremia: Secondary | ICD-10-CM | POA: Diagnosis not present

## 2020-01-09 DIAGNOSIS — R52 Pain, unspecified: Secondary | ICD-10-CM | POA: Diagnosis not present

## 2020-01-09 DIAGNOSIS — M80051A Age-related osteoporosis with current pathological fracture, right femur, initial encounter for fracture: Secondary | ICD-10-CM | POA: Diagnosis not present

## 2020-01-09 DIAGNOSIS — W06XXXA Fall from bed, initial encounter: Secondary | ICD-10-CM | POA: Diagnosis not present

## 2020-01-09 DIAGNOSIS — Z471 Aftercare following joint replacement surgery: Secondary | ICD-10-CM | POA: Diagnosis not present

## 2020-01-09 DIAGNOSIS — J9621 Acute and chronic respiratory failure with hypoxia: Secondary | ICD-10-CM | POA: Diagnosis not present

## 2020-01-09 DIAGNOSIS — I10 Essential (primary) hypertension: Secondary | ICD-10-CM | POA: Diagnosis not present

## 2020-01-09 DIAGNOSIS — R410 Disorientation, unspecified: Secondary | ICD-10-CM | POA: Diagnosis not present

## 2020-01-09 DIAGNOSIS — N3289 Other specified disorders of bladder: Secondary | ICD-10-CM | POA: Diagnosis not present

## 2020-01-09 DIAGNOSIS — I5022 Chronic systolic (congestive) heart failure: Secondary | ICD-10-CM | POA: Diagnosis not present

## 2020-01-09 DIAGNOSIS — M2550 Pain in unspecified joint: Secondary | ICD-10-CM | POA: Diagnosis not present

## 2020-01-09 DIAGNOSIS — S79921A Unspecified injury of right thigh, initial encounter: Secondary | ICD-10-CM | POA: Diagnosis not present

## 2020-01-09 DIAGNOSIS — Z96641 Presence of right artificial hip joint: Secondary | ICD-10-CM | POA: Diagnosis not present

## 2020-01-09 DIAGNOSIS — S5291XA Unspecified fracture of right forearm, initial encounter for closed fracture: Secondary | ICD-10-CM | POA: Diagnosis not present

## 2020-01-09 DIAGNOSIS — J449 Chronic obstructive pulmonary disease, unspecified: Secondary | ICD-10-CM | POA: Diagnosis not present

## 2020-01-09 DIAGNOSIS — R69 Illness, unspecified: Secondary | ICD-10-CM | POA: Diagnosis not present

## 2020-01-09 DIAGNOSIS — J441 Chronic obstructive pulmonary disease with (acute) exacerbation: Secondary | ICD-10-CM | POA: Diagnosis not present

## 2020-01-09 DIAGNOSIS — R0602 Shortness of breath: Secondary | ICD-10-CM | POA: Diagnosis not present

## 2020-01-09 DIAGNOSIS — I350 Nonrheumatic aortic (valve) stenosis: Secondary | ICD-10-CM | POA: Diagnosis not present

## 2020-01-09 DIAGNOSIS — I700 Atherosclerosis of aorta: Secondary | ICD-10-CM | POA: Diagnosis not present

## 2020-01-09 DIAGNOSIS — I509 Heart failure, unspecified: Secondary | ICD-10-CM | POA: Diagnosis not present

## 2020-01-09 DIAGNOSIS — S5291XD Unspecified fracture of right forearm, subsequent encounter for closed fracture with routine healing: Secondary | ICD-10-CM | POA: Diagnosis not present

## 2020-01-09 DIAGNOSIS — S72001D Fracture of unspecified part of neck of right femur, subsequent encounter for closed fracture with routine healing: Secondary | ICD-10-CM | POA: Diagnosis not present

## 2020-01-09 DIAGNOSIS — D62 Acute posthemorrhagic anemia: Secondary | ICD-10-CM | POA: Diagnosis not present

## 2020-01-09 DIAGNOSIS — K6389 Other specified diseases of intestine: Secondary | ICD-10-CM | POA: Diagnosis not present

## 2020-01-09 DIAGNOSIS — E222 Syndrome of inappropriate secretion of antidiuretic hormone: Secondary | ICD-10-CM | POA: Diagnosis not present

## 2020-01-09 DIAGNOSIS — N183 Chronic kidney disease, stage 3 unspecified: Secondary | ICD-10-CM | POA: Diagnosis not present

## 2020-01-09 DIAGNOSIS — I130 Hypertensive heart and chronic kidney disease with heart failure and stage 1 through stage 4 chronic kidney disease, or unspecified chronic kidney disease: Secondary | ICD-10-CM | POA: Diagnosis not present

## 2020-01-09 DIAGNOSIS — S72001A Fracture of unspecified part of neck of right femur, initial encounter for closed fracture: Secondary | ICD-10-CM | POA: Diagnosis not present

## 2020-01-09 DIAGNOSIS — Z7401 Bed confinement status: Secondary | ICD-10-CM | POA: Diagnosis not present

## 2020-01-09 DIAGNOSIS — M25551 Pain in right hip: Secondary | ICD-10-CM | POA: Diagnosis not present

## 2020-01-09 DIAGNOSIS — I361 Nonrheumatic tricuspid (valve) insufficiency: Secondary | ICD-10-CM | POA: Diagnosis not present

## 2020-01-09 DIAGNOSIS — W19XXXA Unspecified fall, initial encounter: Secondary | ICD-10-CM | POA: Diagnosis not present

## 2020-01-10 DIAGNOSIS — S72001A Fracture of unspecified part of neck of right femur, initial encounter for closed fracture: Secondary | ICD-10-CM | POA: Diagnosis not present

## 2020-01-10 DIAGNOSIS — N3289 Other specified disorders of bladder: Secondary | ICD-10-CM | POA: Diagnosis not present

## 2020-01-10 DIAGNOSIS — I509 Heart failure, unspecified: Secondary | ICD-10-CM | POA: Diagnosis not present

## 2020-01-10 DIAGNOSIS — I361 Nonrheumatic tricuspid (valve) insufficiency: Secondary | ICD-10-CM

## 2020-01-10 DIAGNOSIS — Z96641 Presence of right artificial hip joint: Secondary | ICD-10-CM | POA: Diagnosis not present

## 2020-01-10 DIAGNOSIS — S5291XA Unspecified fracture of right forearm, initial encounter for closed fracture: Secondary | ICD-10-CM | POA: Diagnosis not present

## 2020-01-10 DIAGNOSIS — K6389 Other specified diseases of intestine: Secondary | ICD-10-CM | POA: Diagnosis not present

## 2020-01-10 DIAGNOSIS — I350 Nonrheumatic aortic (valve) stenosis: Secondary | ICD-10-CM

## 2020-01-10 DIAGNOSIS — N19 Unspecified kidney failure: Secondary | ICD-10-CM | POA: Diagnosis not present

## 2020-01-10 DIAGNOSIS — Z471 Aftercare following joint replacement surgery: Secondary | ICD-10-CM | POA: Diagnosis not present

## 2020-01-10 DIAGNOSIS — I110 Hypertensive heart disease with heart failure: Secondary | ICD-10-CM | POA: Diagnosis not present

## 2020-01-10 DIAGNOSIS — M1611 Unilateral primary osteoarthritis, right hip: Secondary | ICD-10-CM | POA: Diagnosis not present

## 2020-01-10 DIAGNOSIS — S72011A Unspecified intracapsular fracture of right femur, initial encounter for closed fracture: Secondary | ICD-10-CM | POA: Diagnosis not present

## 2020-01-10 DIAGNOSIS — M80051A Age-related osteoporosis with current pathological fracture, right femur, initial encounter for fracture: Secondary | ICD-10-CM | POA: Diagnosis not present

## 2020-01-10 DIAGNOSIS — J9621 Acute and chronic respiratory failure with hypoxia: Secondary | ICD-10-CM | POA: Diagnosis not present

## 2020-01-11 DIAGNOSIS — S72011A Unspecified intracapsular fracture of right femur, initial encounter for closed fracture: Secondary | ICD-10-CM | POA: Diagnosis not present

## 2020-01-11 DIAGNOSIS — J441 Chronic obstructive pulmonary disease with (acute) exacerbation: Secondary | ICD-10-CM | POA: Diagnosis not present

## 2020-01-11 DIAGNOSIS — S5291XA Unspecified fracture of right forearm, initial encounter for closed fracture: Secondary | ICD-10-CM | POA: Diagnosis not present

## 2020-01-11 DIAGNOSIS — J9621 Acute and chronic respiratory failure with hypoxia: Secondary | ICD-10-CM | POA: Diagnosis not present

## 2020-01-11 DIAGNOSIS — J449 Chronic obstructive pulmonary disease, unspecified: Secondary | ICD-10-CM | POA: Diagnosis not present

## 2020-01-12 DIAGNOSIS — S5291XA Unspecified fracture of right forearm, initial encounter for closed fracture: Secondary | ICD-10-CM | POA: Diagnosis not present

## 2020-01-12 DIAGNOSIS — J9621 Acute and chronic respiratory failure with hypoxia: Secondary | ICD-10-CM | POA: Diagnosis not present

## 2020-01-12 DIAGNOSIS — S72011A Unspecified intracapsular fracture of right femur, initial encounter for closed fracture: Secondary | ICD-10-CM | POA: Diagnosis not present

## 2020-01-13 DIAGNOSIS — S72011A Unspecified intracapsular fracture of right femur, initial encounter for closed fracture: Secondary | ICD-10-CM | POA: Diagnosis not present

## 2020-01-13 DIAGNOSIS — J9621 Acute and chronic respiratory failure with hypoxia: Secondary | ICD-10-CM | POA: Diagnosis not present

## 2020-01-13 DIAGNOSIS — S5291XA Unspecified fracture of right forearm, initial encounter for closed fracture: Secondary | ICD-10-CM | POA: Diagnosis not present

## 2020-01-14 DIAGNOSIS — J9621 Acute and chronic respiratory failure with hypoxia: Secondary | ICD-10-CM | POA: Diagnosis not present

## 2020-01-14 DIAGNOSIS — S72011A Unspecified intracapsular fracture of right femur, initial encounter for closed fracture: Secondary | ICD-10-CM | POA: Diagnosis not present

## 2020-01-14 DIAGNOSIS — S5291XA Unspecified fracture of right forearm, initial encounter for closed fracture: Secondary | ICD-10-CM | POA: Diagnosis not present

## 2020-01-15 DIAGNOSIS — E785 Hyperlipidemia, unspecified: Secondary | ICD-10-CM | POA: Diagnosis not present

## 2020-01-15 DIAGNOSIS — E875 Hyperkalemia: Secondary | ICD-10-CM | POA: Diagnosis not present

## 2020-01-15 DIAGNOSIS — J449 Chronic obstructive pulmonary disease, unspecified: Secondary | ICD-10-CM | POA: Diagnosis not present

## 2020-01-15 DIAGNOSIS — M2550 Pain in unspecified joint: Secondary | ICD-10-CM | POA: Diagnosis not present

## 2020-01-15 DIAGNOSIS — D62 Acute posthemorrhagic anemia: Secondary | ICD-10-CM | POA: Diagnosis not present

## 2020-01-15 DIAGNOSIS — R410 Disorientation, unspecified: Secondary | ICD-10-CM | POA: Diagnosis not present

## 2020-01-15 DIAGNOSIS — N179 Acute kidney failure, unspecified: Secondary | ICD-10-CM | POA: Diagnosis not present

## 2020-01-15 DIAGNOSIS — I509 Heart failure, unspecified: Secondary | ICD-10-CM | POA: Diagnosis not present

## 2020-01-15 DIAGNOSIS — I10 Essential (primary) hypertension: Secondary | ICD-10-CM | POA: Diagnosis not present

## 2020-01-15 DIAGNOSIS — E871 Hypo-osmolality and hyponatremia: Secondary | ICD-10-CM | POA: Diagnosis not present

## 2020-01-15 DIAGNOSIS — R7989 Other specified abnormal findings of blood chemistry: Secondary | ICD-10-CM | POA: Diagnosis not present

## 2020-01-15 DIAGNOSIS — Z7401 Bed confinement status: Secondary | ICD-10-CM | POA: Diagnosis not present

## 2020-01-15 DIAGNOSIS — S72011A Unspecified intracapsular fracture of right femur, initial encounter for closed fracture: Secondary | ICD-10-CM | POA: Diagnosis not present

## 2020-01-15 DIAGNOSIS — I5022 Chronic systolic (congestive) heart failure: Secondary | ICD-10-CM | POA: Diagnosis not present

## 2020-01-15 DIAGNOSIS — R69 Illness, unspecified: Secondary | ICD-10-CM | POA: Diagnosis not present

## 2020-01-15 DIAGNOSIS — E119 Type 2 diabetes mellitus without complications: Secondary | ICD-10-CM | POA: Diagnosis not present

## 2020-01-15 DIAGNOSIS — W19XXXA Unspecified fall, initial encounter: Secondary | ICD-10-CM | POA: Diagnosis not present

## 2020-01-15 DIAGNOSIS — R5381 Other malaise: Secondary | ICD-10-CM | POA: Diagnosis not present

## 2020-01-15 DIAGNOSIS — S5291XD Unspecified fracture of right forearm, subsequent encounter for closed fracture with routine healing: Secondary | ICD-10-CM | POA: Diagnosis not present

## 2020-01-15 DIAGNOSIS — R05 Cough: Secondary | ICD-10-CM | POA: Diagnosis not present

## 2020-01-15 DIAGNOSIS — S7291XA Unspecified fracture of right femur, initial encounter for closed fracture: Secondary | ICD-10-CM | POA: Diagnosis not present

## 2020-01-15 DIAGNOSIS — S72001D Fracture of unspecified part of neck of right femur, subsequent encounter for closed fracture with routine healing: Secondary | ICD-10-CM | POA: Diagnosis not present

## 2020-01-15 DIAGNOSIS — S5291XA Unspecified fracture of right forearm, initial encounter for closed fracture: Secondary | ICD-10-CM | POA: Diagnosis not present

## 2020-01-15 DIAGNOSIS — J9621 Acute and chronic respiratory failure with hypoxia: Secondary | ICD-10-CM | POA: Diagnosis not present

## 2020-01-15 DIAGNOSIS — R062 Wheezing: Secondary | ICD-10-CM | POA: Diagnosis not present

## 2020-01-18 DIAGNOSIS — I10 Essential (primary) hypertension: Secondary | ICD-10-CM | POA: Diagnosis not present

## 2020-01-18 DIAGNOSIS — E871 Hypo-osmolality and hyponatremia: Secondary | ICD-10-CM | POA: Diagnosis not present

## 2020-01-18 DIAGNOSIS — S5291XA Unspecified fracture of right forearm, initial encounter for closed fracture: Secondary | ICD-10-CM | POA: Diagnosis not present

## 2020-01-18 DIAGNOSIS — I5022 Chronic systolic (congestive) heart failure: Secondary | ICD-10-CM | POA: Diagnosis not present

## 2020-01-18 DIAGNOSIS — S7291XA Unspecified fracture of right femur, initial encounter for closed fracture: Secondary | ICD-10-CM | POA: Diagnosis not present

## 2020-01-18 DIAGNOSIS — R5381 Other malaise: Secondary | ICD-10-CM | POA: Diagnosis not present

## 2020-01-18 DIAGNOSIS — N179 Acute kidney failure, unspecified: Secondary | ICD-10-CM | POA: Diagnosis not present

## 2020-01-18 DIAGNOSIS — R69 Illness, unspecified: Secondary | ICD-10-CM | POA: Diagnosis not present

## 2020-01-23 DIAGNOSIS — E875 Hyperkalemia: Secondary | ICD-10-CM | POA: Diagnosis not present

## 2020-01-23 DIAGNOSIS — R05 Cough: Secondary | ICD-10-CM | POA: Diagnosis not present

## 2020-01-23 DIAGNOSIS — R7989 Other specified abnormal findings of blood chemistry: Secondary | ICD-10-CM | POA: Diagnosis not present

## 2020-02-01 DIAGNOSIS — I10 Essential (primary) hypertension: Secondary | ICD-10-CM | POA: Diagnosis not present

## 2020-02-01 DIAGNOSIS — R5381 Other malaise: Secondary | ICD-10-CM | POA: Diagnosis not present

## 2020-02-01 DIAGNOSIS — E871 Hypo-osmolality and hyponatremia: Secondary | ICD-10-CM | POA: Diagnosis not present

## 2020-02-01 DIAGNOSIS — I5022 Chronic systolic (congestive) heart failure: Secondary | ICD-10-CM | POA: Diagnosis not present

## 2020-02-01 DIAGNOSIS — S5291XA Unspecified fracture of right forearm, initial encounter for closed fracture: Secondary | ICD-10-CM | POA: Diagnosis not present

## 2020-02-01 DIAGNOSIS — R69 Illness, unspecified: Secondary | ICD-10-CM | POA: Diagnosis not present

## 2020-02-01 DIAGNOSIS — N179 Acute kidney failure, unspecified: Secondary | ICD-10-CM | POA: Diagnosis not present

## 2020-02-01 DIAGNOSIS — S7291XA Unspecified fracture of right femur, initial encounter for closed fracture: Secondary | ICD-10-CM | POA: Diagnosis not present

## 2020-02-06 DIAGNOSIS — I5042 Chronic combined systolic (congestive) and diastolic (congestive) heart failure: Secondary | ICD-10-CM | POA: Diagnosis not present

## 2020-02-06 DIAGNOSIS — M810 Age-related osteoporosis without current pathological fracture: Secondary | ICD-10-CM | POA: Diagnosis not present

## 2020-02-06 DIAGNOSIS — I5022 Chronic systolic (congestive) heart failure: Secondary | ICD-10-CM | POA: Diagnosis not present

## 2020-02-06 DIAGNOSIS — I110 Hypertensive heart disease with heart failure: Secondary | ICD-10-CM | POA: Diagnosis not present

## 2020-02-06 DIAGNOSIS — S72001D Fracture of unspecified part of neck of right femur, subsequent encounter for closed fracture with routine healing: Secondary | ICD-10-CM | POA: Diagnosis not present

## 2020-02-06 DIAGNOSIS — I38 Endocarditis, valve unspecified: Secondary | ICD-10-CM | POA: Diagnosis not present

## 2020-02-06 DIAGNOSIS — B370 Candidal stomatitis: Secondary | ICD-10-CM | POA: Diagnosis not present

## 2020-02-06 DIAGNOSIS — J449 Chronic obstructive pulmonary disease, unspecified: Secondary | ICD-10-CM | POA: Diagnosis not present

## 2020-02-06 DIAGNOSIS — B3781 Candidal esophagitis: Secondary | ICD-10-CM | POA: Diagnosis not present

## 2020-02-06 DIAGNOSIS — S42401D Unspecified fracture of lower end of right humerus, subsequent encounter for fracture with routine healing: Secondary | ICD-10-CM | POA: Diagnosis not present

## 2020-02-11 DIAGNOSIS — J441 Chronic obstructive pulmonary disease with (acute) exacerbation: Secondary | ICD-10-CM | POA: Diagnosis not present

## 2020-02-11 DIAGNOSIS — J449 Chronic obstructive pulmonary disease, unspecified: Secondary | ICD-10-CM | POA: Diagnosis not present

## 2020-02-14 DIAGNOSIS — R69 Illness, unspecified: Secondary | ICD-10-CM | POA: Diagnosis not present

## 2020-02-15 DIAGNOSIS — J449 Chronic obstructive pulmonary disease, unspecified: Secondary | ICD-10-CM | POA: Diagnosis not present

## 2020-02-16 DIAGNOSIS — R531 Weakness: Secondary | ICD-10-CM | POA: Diagnosis not present

## 2020-02-16 DIAGNOSIS — R5381 Other malaise: Secondary | ICD-10-CM | POA: Diagnosis not present

## 2020-02-16 DIAGNOSIS — M4319 Spondylolisthesis, multiple sites in spine: Secondary | ICD-10-CM | POA: Diagnosis not present

## 2020-02-16 DIAGNOSIS — S199XXA Unspecified injury of neck, initial encounter: Secondary | ICD-10-CM | POA: Diagnosis not present

## 2020-02-16 DIAGNOSIS — W010XXA Fall on same level from slipping, tripping and stumbling without subsequent striking against object, initial encounter: Secondary | ICD-10-CM | POA: Diagnosis not present

## 2020-02-16 DIAGNOSIS — H748X2 Other specified disorders of left middle ear and mastoid: Secondary | ICD-10-CM | POA: Diagnosis not present

## 2020-02-16 DIAGNOSIS — M47812 Spondylosis without myelopathy or radiculopathy, cervical region: Secondary | ICD-10-CM | POA: Diagnosis not present

## 2020-02-16 DIAGNOSIS — G319 Degenerative disease of nervous system, unspecified: Secondary | ICD-10-CM | POA: Diagnosis not present

## 2020-02-16 DIAGNOSIS — S01111A Laceration without foreign body of right eyelid and periocular area, initial encounter: Secondary | ICD-10-CM | POA: Diagnosis not present

## 2020-02-16 DIAGNOSIS — I6381 Other cerebral infarction due to occlusion or stenosis of small artery: Secondary | ICD-10-CM | POA: Diagnosis not present

## 2020-02-16 DIAGNOSIS — S0191XA Laceration without foreign body of unspecified part of head, initial encounter: Secondary | ICD-10-CM | POA: Diagnosis not present

## 2020-02-16 DIAGNOSIS — Z8673 Personal history of transient ischemic attack (TIA), and cerebral infarction without residual deficits: Secondary | ICD-10-CM | POA: Diagnosis not present

## 2020-02-16 DIAGNOSIS — W19XXXA Unspecified fall, initial encounter: Secondary | ICD-10-CM | POA: Diagnosis not present

## 2020-02-19 DIAGNOSIS — E119 Type 2 diabetes mellitus without complications: Secondary | ICD-10-CM | POA: Diagnosis not present

## 2020-03-02 DIAGNOSIS — Z87891 Personal history of nicotine dependence: Secondary | ICD-10-CM | POA: Diagnosis not present

## 2020-03-02 DIAGNOSIS — M19012 Primary osteoarthritis, left shoulder: Secondary | ICD-10-CM | POA: Diagnosis not present

## 2020-03-02 DIAGNOSIS — S299XXA Unspecified injury of thorax, initial encounter: Secondary | ICD-10-CM | POA: Diagnosis not present

## 2020-03-02 DIAGNOSIS — Z743 Need for continuous supervision: Secondary | ICD-10-CM | POA: Diagnosis not present

## 2020-03-02 DIAGNOSIS — R0789 Other chest pain: Secondary | ICD-10-CM | POA: Diagnosis not present

## 2020-03-02 DIAGNOSIS — E119 Type 2 diabetes mellitus without complications: Secondary | ICD-10-CM | POA: Diagnosis not present

## 2020-03-02 DIAGNOSIS — R279 Unspecified lack of coordination: Secondary | ICD-10-CM | POA: Diagnosis not present

## 2020-03-02 DIAGNOSIS — R079 Chest pain, unspecified: Secondary | ICD-10-CM | POA: Diagnosis not present

## 2020-03-02 DIAGNOSIS — I10 Essential (primary) hypertension: Secondary | ICD-10-CM | POA: Diagnosis not present

## 2020-03-02 DIAGNOSIS — W19XXXA Unspecified fall, initial encounter: Secondary | ICD-10-CM | POA: Diagnosis not present

## 2020-03-02 DIAGNOSIS — R410 Disorientation, unspecified: Secondary | ICD-10-CM | POA: Diagnosis not present

## 2020-03-02 DIAGNOSIS — J449 Chronic obstructive pulmonary disease, unspecified: Secondary | ICD-10-CM | POA: Diagnosis not present

## 2020-03-02 DIAGNOSIS — I959 Hypotension, unspecified: Secondary | ICD-10-CM | POA: Diagnosis not present

## 2020-03-02 DIAGNOSIS — Z9981 Dependence on supplemental oxygen: Secondary | ICD-10-CM | POA: Diagnosis not present

## 2020-03-06 DIAGNOSIS — J811 Chronic pulmonary edema: Secondary | ICD-10-CM | POA: Diagnosis not present

## 2020-03-07 DIAGNOSIS — Z1152 Encounter for screening for COVID-19: Secondary | ICD-10-CM | POA: Diagnosis not present

## 2020-03-11 DIAGNOSIS — Z1152 Encounter for screening for COVID-19: Secondary | ICD-10-CM | POA: Diagnosis not present

## 2020-03-13 DIAGNOSIS — J441 Chronic obstructive pulmonary disease with (acute) exacerbation: Secondary | ICD-10-CM | POA: Diagnosis not present

## 2020-03-13 DIAGNOSIS — J449 Chronic obstructive pulmonary disease, unspecified: Secondary | ICD-10-CM | POA: Diagnosis not present

## 2020-03-13 DIAGNOSIS — I509 Heart failure, unspecified: Secondary | ICD-10-CM | POA: Diagnosis not present

## 2020-03-13 DIAGNOSIS — R609 Edema, unspecified: Secondary | ICD-10-CM | POA: Diagnosis not present

## 2020-03-15 DIAGNOSIS — R52 Pain, unspecified: Secondary | ICD-10-CM | POA: Diagnosis not present

## 2020-03-15 DIAGNOSIS — R58 Hemorrhage, not elsewhere classified: Secondary | ICD-10-CM | POA: Diagnosis not present

## 2020-03-15 DIAGNOSIS — K219 Gastro-esophageal reflux disease without esophagitis: Secondary | ICD-10-CM | POA: Diagnosis not present

## 2020-03-15 DIAGNOSIS — I130 Hypertensive heart and chronic kidney disease with heart failure and stage 1 through stage 4 chronic kidney disease, or unspecified chronic kidney disease: Secondary | ICD-10-CM | POA: Diagnosis not present

## 2020-03-15 DIAGNOSIS — N170 Acute kidney failure with tubular necrosis: Secondary | ICD-10-CM | POA: Diagnosis not present

## 2020-03-15 DIAGNOSIS — M2550 Pain in unspecified joint: Secondary | ICD-10-CM | POA: Diagnosis not present

## 2020-03-15 DIAGNOSIS — J9611 Chronic respiratory failure with hypoxia: Secondary | ICD-10-CM | POA: Diagnosis not present

## 2020-03-15 DIAGNOSIS — J441 Chronic obstructive pulmonary disease with (acute) exacerbation: Secondary | ICD-10-CM | POA: Diagnosis not present

## 2020-03-15 DIAGNOSIS — Z7401 Bed confinement status: Secondary | ICD-10-CM | POA: Diagnosis not present

## 2020-03-15 DIAGNOSIS — I959 Hypotension, unspecified: Secondary | ICD-10-CM | POA: Diagnosis not present

## 2020-03-15 DIAGNOSIS — M25552 Pain in left hip: Secondary | ICD-10-CM | POA: Diagnosis not present

## 2020-03-15 DIAGNOSIS — J690 Pneumonitis due to inhalation of food and vomit: Secondary | ICD-10-CM | POA: Diagnosis not present

## 2020-03-15 DIAGNOSIS — M25571 Pain in right ankle and joints of right foot: Secondary | ICD-10-CM | POA: Diagnosis not present

## 2020-03-15 DIAGNOSIS — R0902 Hypoxemia: Secondary | ICD-10-CM | POA: Diagnosis not present

## 2020-03-15 DIAGNOSIS — R0602 Shortness of breath: Secondary | ICD-10-CM | POA: Diagnosis not present

## 2020-03-15 DIAGNOSIS — R69 Illness, unspecified: Secondary | ICD-10-CM | POA: Diagnosis not present

## 2020-03-15 DIAGNOSIS — R410 Disorientation, unspecified: Secondary | ICD-10-CM | POA: Diagnosis not present

## 2020-03-15 DIAGNOSIS — J189 Pneumonia, unspecified organism: Secondary | ICD-10-CM | POA: Diagnosis not present

## 2020-03-15 DIAGNOSIS — J449 Chronic obstructive pulmonary disease, unspecified: Secondary | ICD-10-CM | POA: Diagnosis not present

## 2020-03-15 DIAGNOSIS — N179 Acute kidney failure, unspecified: Secondary | ICD-10-CM | POA: Diagnosis not present

## 2020-03-15 DIAGNOSIS — R600 Localized edema: Secondary | ICD-10-CM | POA: Diagnosis not present

## 2020-03-15 DIAGNOSIS — E871 Hypo-osmolality and hyponatremia: Secondary | ICD-10-CM | POA: Diagnosis not present

## 2020-03-15 DIAGNOSIS — I5022 Chronic systolic (congestive) heart failure: Secondary | ICD-10-CM | POA: Diagnosis not present

## 2020-03-15 DIAGNOSIS — Z043 Encounter for examination and observation following other accident: Secondary | ICD-10-CM | POA: Diagnosis not present

## 2020-03-15 DIAGNOSIS — E860 Dehydration: Secondary | ICD-10-CM | POA: Diagnosis not present

## 2020-03-15 DIAGNOSIS — R4182 Altered mental status, unspecified: Secondary | ICD-10-CM | POA: Diagnosis not present

## 2020-03-16 DIAGNOSIS — N170 Acute kidney failure with tubular necrosis: Secondary | ICD-10-CM | POA: Diagnosis not present

## 2020-03-16 DIAGNOSIS — Z043 Encounter for examination and observation following other accident: Secondary | ICD-10-CM | POA: Diagnosis not present

## 2020-03-16 DIAGNOSIS — J690 Pneumonitis due to inhalation of food and vomit: Secondary | ICD-10-CM | POA: Diagnosis not present

## 2020-03-16 DIAGNOSIS — J441 Chronic obstructive pulmonary disease with (acute) exacerbation: Secondary | ICD-10-CM | POA: Diagnosis not present

## 2020-03-17 DIAGNOSIS — J449 Chronic obstructive pulmonary disease, unspecified: Secondary | ICD-10-CM | POA: Diagnosis not present

## 2020-03-17 DIAGNOSIS — J690 Pneumonitis due to inhalation of food and vomit: Secondary | ICD-10-CM | POA: Diagnosis not present

## 2020-03-17 DIAGNOSIS — N170 Acute kidney failure with tubular necrosis: Secondary | ICD-10-CM | POA: Diagnosis not present

## 2020-03-17 DIAGNOSIS — R0602 Shortness of breath: Secondary | ICD-10-CM | POA: Diagnosis not present

## 2020-03-17 DIAGNOSIS — J441 Chronic obstructive pulmonary disease with (acute) exacerbation: Secondary | ICD-10-CM | POA: Diagnosis not present

## 2020-03-18 DIAGNOSIS — J441 Chronic obstructive pulmonary disease with (acute) exacerbation: Secondary | ICD-10-CM | POA: Diagnosis not present

## 2020-03-18 DIAGNOSIS — J690 Pneumonitis due to inhalation of food and vomit: Secondary | ICD-10-CM | POA: Diagnosis not present

## 2020-03-18 DIAGNOSIS — N170 Acute kidney failure with tubular necrosis: Secondary | ICD-10-CM | POA: Diagnosis not present

## 2020-03-19 DIAGNOSIS — J441 Chronic obstructive pulmonary disease with (acute) exacerbation: Secondary | ICD-10-CM | POA: Diagnosis not present

## 2020-03-19 DIAGNOSIS — N170 Acute kidney failure with tubular necrosis: Secondary | ICD-10-CM | POA: Diagnosis not present

## 2020-03-19 DIAGNOSIS — J690 Pneumonitis due to inhalation of food and vomit: Secondary | ICD-10-CM | POA: Diagnosis not present

## 2020-03-20 DIAGNOSIS — J189 Pneumonia, unspecified organism: Secondary | ICD-10-CM | POA: Diagnosis not present

## 2020-03-20 DIAGNOSIS — R4182 Altered mental status, unspecified: Secondary | ICD-10-CM | POA: Diagnosis not present

## 2020-03-20 DIAGNOSIS — R0902 Hypoxemia: Secondary | ICD-10-CM | POA: Diagnosis not present

## 2020-03-20 DIAGNOSIS — R410 Disorientation, unspecified: Secondary | ICD-10-CM | POA: Diagnosis not present

## 2020-03-20 DIAGNOSIS — J690 Pneumonitis due to inhalation of food and vomit: Secondary | ICD-10-CM | POA: Diagnosis not present

## 2020-03-20 DIAGNOSIS — N170 Acute kidney failure with tubular necrosis: Secondary | ICD-10-CM | POA: Diagnosis not present

## 2020-03-20 DIAGNOSIS — I959 Hypotension, unspecified: Secondary | ICD-10-CM | POA: Diagnosis not present

## 2020-03-20 DIAGNOSIS — Z7401 Bed confinement status: Secondary | ICD-10-CM | POA: Diagnosis not present

## 2020-03-20 DIAGNOSIS — M2550 Pain in unspecified joint: Secondary | ICD-10-CM | POA: Diagnosis not present

## 2020-03-20 DIAGNOSIS — J441 Chronic obstructive pulmonary disease with (acute) exacerbation: Secondary | ICD-10-CM | POA: Diagnosis not present

## 2020-03-21 DIAGNOSIS — D122 Benign neoplasm of ascending colon: Secondary | ICD-10-CM | POA: Diagnosis not present

## 2020-03-21 DIAGNOSIS — D124 Benign neoplasm of descending colon: Secondary | ICD-10-CM | POA: Diagnosis not present

## 2020-03-21 DIAGNOSIS — R69 Illness, unspecified: Secondary | ICD-10-CM | POA: Diagnosis not present

## 2020-03-25 DIAGNOSIS — Z1152 Encounter for screening for COVID-19: Secondary | ICD-10-CM | POA: Diagnosis not present

## 2020-03-27 DIAGNOSIS — J690 Pneumonitis due to inhalation of food and vomit: Secondary | ICD-10-CM | POA: Diagnosis not present

## 2020-03-27 DIAGNOSIS — M1991 Primary osteoarthritis, unspecified site: Secondary | ICD-10-CM | POA: Diagnosis not present

## 2020-03-27 DIAGNOSIS — B370 Candidal stomatitis: Secondary | ICD-10-CM | POA: Diagnosis not present

## 2020-03-27 DIAGNOSIS — Z9981 Dependence on supplemental oxygen: Secondary | ICD-10-CM | POA: Diagnosis not present

## 2020-03-27 DIAGNOSIS — E871 Hypo-osmolality and hyponatremia: Secondary | ICD-10-CM | POA: Diagnosis not present

## 2020-03-27 DIAGNOSIS — J9611 Chronic respiratory failure with hypoxia: Secondary | ICD-10-CM | POA: Diagnosis not present

## 2020-03-27 DIAGNOSIS — R69 Illness, unspecified: Secondary | ICD-10-CM | POA: Diagnosis not present

## 2020-03-27 DIAGNOSIS — M810 Age-related osteoporosis without current pathological fracture: Secondary | ICD-10-CM | POA: Diagnosis not present

## 2020-03-27 DIAGNOSIS — B3781 Candidal esophagitis: Secondary | ICD-10-CM | POA: Diagnosis not present

## 2020-03-27 DIAGNOSIS — G894 Chronic pain syndrome: Secondary | ICD-10-CM | POA: Diagnosis not present

## 2020-04-09 DIAGNOSIS — D122 Benign neoplasm of ascending colon: Secondary | ICD-10-CM | POA: Diagnosis not present

## 2020-04-09 DIAGNOSIS — D124 Benign neoplasm of descending colon: Secondary | ICD-10-CM | POA: Diagnosis not present

## 2020-04-09 DIAGNOSIS — R69 Illness, unspecified: Secondary | ICD-10-CM | POA: Diagnosis not present

## 2020-04-20 DIAGNOSIS — E119 Type 2 diabetes mellitus without complications: Secondary | ICD-10-CM | POA: Diagnosis not present

## 2020-04-22 DIAGNOSIS — Z1152 Encounter for screening for COVID-19: Secondary | ICD-10-CM | POA: Diagnosis not present

## 2020-05-07 DIAGNOSIS — Z9981 Dependence on supplemental oxygen: Secondary | ICD-10-CM | POA: Diagnosis not present

## 2020-05-07 DIAGNOSIS — E039 Hypothyroidism, unspecified: Secondary | ICD-10-CM | POA: Diagnosis not present

## 2020-05-07 DIAGNOSIS — J449 Chronic obstructive pulmonary disease, unspecified: Secondary | ICD-10-CM | POA: Diagnosis not present

## 2020-05-07 DIAGNOSIS — E1149 Type 2 diabetes mellitus with other diabetic neurological complication: Secondary | ICD-10-CM | POA: Diagnosis not present

## 2020-05-07 DIAGNOSIS — J9611 Chronic respiratory failure with hypoxia: Secondary | ICD-10-CM | POA: Diagnosis not present

## 2020-05-07 DIAGNOSIS — I5022 Chronic systolic (congestive) heart failure: Secondary | ICD-10-CM | POA: Diagnosis not present

## 2020-05-07 DIAGNOSIS — Z79899 Other long term (current) drug therapy: Secondary | ICD-10-CM | POA: Diagnosis not present

## 2020-05-07 DIAGNOSIS — E441 Mild protein-calorie malnutrition: Secondary | ICD-10-CM | POA: Diagnosis not present

## 2020-05-07 DIAGNOSIS — J441 Chronic obstructive pulmonary disease with (acute) exacerbation: Secondary | ICD-10-CM | POA: Diagnosis not present

## 2020-05-07 DIAGNOSIS — R69 Illness, unspecified: Secondary | ICD-10-CM | POA: Diagnosis not present

## 2020-05-10 DIAGNOSIS — S01111A Laceration without foreign body of right eyelid and periocular area, initial encounter: Secondary | ICD-10-CM | POA: Diagnosis not present

## 2020-05-10 DIAGNOSIS — R52 Pain, unspecified: Secondary | ICD-10-CM | POA: Diagnosis not present

## 2020-05-10 DIAGNOSIS — Z8673 Personal history of transient ischemic attack (TIA), and cerebral infarction without residual deficits: Secondary | ICD-10-CM | POA: Diagnosis not present

## 2020-05-10 DIAGNOSIS — W19XXXA Unspecified fall, initial encounter: Secondary | ICD-10-CM | POA: Diagnosis not present

## 2020-05-10 DIAGNOSIS — J449 Chronic obstructive pulmonary disease, unspecified: Secondary | ICD-10-CM | POA: Diagnosis not present

## 2020-05-10 DIAGNOSIS — R9082 White matter disease, unspecified: Secondary | ICD-10-CM | POA: Diagnosis not present

## 2020-05-10 DIAGNOSIS — S0990XA Unspecified injury of head, initial encounter: Secondary | ICD-10-CM | POA: Diagnosis not present

## 2020-05-10 DIAGNOSIS — I110 Hypertensive heart disease with heart failure: Secondary | ICD-10-CM | POA: Diagnosis not present

## 2020-05-10 DIAGNOSIS — R7989 Other specified abnormal findings of blood chemistry: Secondary | ICD-10-CM | POA: Diagnosis not present

## 2020-05-10 DIAGNOSIS — Z043 Encounter for examination and observation following other accident: Secondary | ICD-10-CM | POA: Diagnosis not present

## 2020-05-10 DIAGNOSIS — R58 Hemorrhage, not elsewhere classified: Secondary | ICD-10-CM | POA: Diagnosis not present

## 2020-05-10 DIAGNOSIS — Z8669 Personal history of other diseases of the nervous system and sense organs: Secondary | ICD-10-CM | POA: Diagnosis not present

## 2020-05-10 DIAGNOSIS — I509 Heart failure, unspecified: Secondary | ICD-10-CM | POA: Diagnosis not present

## 2020-05-10 DIAGNOSIS — R0902 Hypoxemia: Secondary | ICD-10-CM | POA: Diagnosis not present

## 2020-05-29 DIAGNOSIS — Z1152 Encounter for screening for COVID-19: Secondary | ICD-10-CM | POA: Diagnosis not present

## 2020-06-26 DIAGNOSIS — Z1152 Encounter for screening for COVID-19: Secondary | ICD-10-CM | POA: Diagnosis not present

## 2020-07-06 ENCOUNTER — Other Ambulatory Visit: Payer: Self-pay

## 2020-07-06 ENCOUNTER — Inpatient Hospital Stay (HOSPITAL_COMMUNITY)
Admission: EM | Admit: 2020-07-06 | Discharge: 2020-07-30 | DRG: 871 | Disposition: E | Source: Skilled Nursing Facility | Attending: Internal Medicine | Admitting: Internal Medicine

## 2020-07-06 ENCOUNTER — Emergency Department (HOSPITAL_COMMUNITY)

## 2020-07-06 ENCOUNTER — Encounter (HOSPITAL_COMMUNITY): Payer: Self-pay | Admitting: Emergency Medicine

## 2020-07-06 DIAGNOSIS — Z87440 Personal history of urinary (tract) infections: Secondary | ICD-10-CM

## 2020-07-06 DIAGNOSIS — I4819 Other persistent atrial fibrillation: Secondary | ICD-10-CM | POA: Diagnosis present

## 2020-07-06 DIAGNOSIS — J9622 Acute and chronic respiratory failure with hypercapnia: Secondary | ICD-10-CM | POA: Diagnosis present

## 2020-07-06 DIAGNOSIS — J9811 Atelectasis: Secondary | ICD-10-CM | POA: Diagnosis present

## 2020-07-06 DIAGNOSIS — G9341 Metabolic encephalopathy: Secondary | ICD-10-CM | POA: Diagnosis present

## 2020-07-06 DIAGNOSIS — E119 Type 2 diabetes mellitus without complications: Secondary | ICD-10-CM

## 2020-07-06 DIAGNOSIS — N1832 Chronic kidney disease, stage 3b: Secondary | ICD-10-CM | POA: Diagnosis present

## 2020-07-06 DIAGNOSIS — N179 Acute kidney failure, unspecified: Secondary | ICD-10-CM | POA: Diagnosis present

## 2020-07-06 DIAGNOSIS — J441 Chronic obstructive pulmonary disease with (acute) exacerbation: Secondary | ICD-10-CM | POA: Diagnosis present

## 2020-07-06 DIAGNOSIS — Z9981 Dependence on supplemental oxygen: Secondary | ICD-10-CM | POA: Diagnosis not present

## 2020-07-06 DIAGNOSIS — R6520 Severe sepsis without septic shock: Secondary | ICD-10-CM

## 2020-07-06 DIAGNOSIS — E785 Hyperlipidemia, unspecified: Secondary | ICD-10-CM | POA: Diagnosis not present

## 2020-07-06 DIAGNOSIS — E039 Hypothyroidism, unspecified: Secondary | ICD-10-CM | POA: Diagnosis present

## 2020-07-06 DIAGNOSIS — R6521 Severe sepsis with septic shock: Secondary | ICD-10-CM | POA: Diagnosis present

## 2020-07-06 DIAGNOSIS — R0603 Acute respiratory distress: Secondary | ICD-10-CM

## 2020-07-06 DIAGNOSIS — Z515 Encounter for palliative care: Secondary | ICD-10-CM

## 2020-07-06 DIAGNOSIS — Y95 Nosocomial condition: Secondary | ICD-10-CM | POA: Diagnosis present

## 2020-07-06 DIAGNOSIS — Z8249 Family history of ischemic heart disease and other diseases of the circulatory system: Secondary | ICD-10-CM

## 2020-07-06 DIAGNOSIS — Z7189 Other specified counseling: Secondary | ICD-10-CM | POA: Diagnosis not present

## 2020-07-06 DIAGNOSIS — R0602 Shortness of breath: Secondary | ICD-10-CM

## 2020-07-06 DIAGNOSIS — N3000 Acute cystitis without hematuria: Secondary | ICD-10-CM | POA: Diagnosis not present

## 2020-07-06 DIAGNOSIS — K219 Gastro-esophageal reflux disease without esophagitis: Secondary | ICD-10-CM | POA: Diagnosis present

## 2020-07-06 DIAGNOSIS — A4181 Sepsis due to Enterococcus: Secondary | ICD-10-CM | POA: Diagnosis present

## 2020-07-06 DIAGNOSIS — R1314 Dysphagia, pharyngoesophageal phase: Secondary | ICD-10-CM | POA: Diagnosis present

## 2020-07-06 DIAGNOSIS — I5043 Acute on chronic combined systolic (congestive) and diastolic (congestive) heart failure: Secondary | ICD-10-CM | POA: Diagnosis not present

## 2020-07-06 DIAGNOSIS — I517 Cardiomegaly: Secondary | ICD-10-CM | POA: Diagnosis not present

## 2020-07-06 DIAGNOSIS — Z885 Allergy status to narcotic agent status: Secondary | ICD-10-CM

## 2020-07-06 DIAGNOSIS — N390 Urinary tract infection, site not specified: Secondary | ICD-10-CM | POA: Diagnosis present

## 2020-07-06 DIAGNOSIS — E1169 Type 2 diabetes mellitus with other specified complication: Secondary | ICD-10-CM | POA: Diagnosis not present

## 2020-07-06 DIAGNOSIS — A419 Sepsis, unspecified organism: Secondary | ICD-10-CM

## 2020-07-06 DIAGNOSIS — Z8673 Personal history of transient ischemic attack (TIA), and cerebral infarction without residual deficits: Secondary | ICD-10-CM

## 2020-07-06 DIAGNOSIS — Z882 Allergy status to sulfonamides status: Secondary | ICD-10-CM

## 2020-07-06 DIAGNOSIS — I350 Nonrheumatic aortic (valve) stenosis: Secondary | ICD-10-CM | POA: Diagnosis not present

## 2020-07-06 DIAGNOSIS — J9621 Acute and chronic respiratory failure with hypoxia: Secondary | ICD-10-CM | POA: Diagnosis present

## 2020-07-06 DIAGNOSIS — Z66 Do not resuscitate: Secondary | ICD-10-CM | POA: Diagnosis present

## 2020-07-06 DIAGNOSIS — G309 Alzheimer's disease, unspecified: Secondary | ICD-10-CM | POA: Diagnosis present

## 2020-07-06 DIAGNOSIS — K222 Esophageal obstruction: Secondary | ICD-10-CM | POA: Diagnosis present

## 2020-07-06 DIAGNOSIS — Z79890 Hormone replacement therapy (postmenopausal): Secondary | ICD-10-CM

## 2020-07-06 DIAGNOSIS — J189 Pneumonia, unspecified organism: Secondary | ICD-10-CM | POA: Diagnosis not present

## 2020-07-06 DIAGNOSIS — I351 Nonrheumatic aortic (valve) insufficiency: Secondary | ICD-10-CM | POA: Diagnosis not present

## 2020-07-06 DIAGNOSIS — Z20822 Contact with and (suspected) exposure to covid-19: Secondary | ICD-10-CM | POA: Diagnosis present

## 2020-07-06 DIAGNOSIS — I130 Hypertensive heart and chronic kidney disease with heart failure and stage 1 through stage 4 chronic kidney disease, or unspecified chronic kidney disease: Secondary | ICD-10-CM | POA: Diagnosis present

## 2020-07-06 DIAGNOSIS — J440 Chronic obstructive pulmonary disease with acute lower respiratory infection: Secondary | ICD-10-CM | POA: Diagnosis present

## 2020-07-06 DIAGNOSIS — E875 Hyperkalemia: Secondary | ICD-10-CM | POA: Diagnosis present

## 2020-07-06 DIAGNOSIS — E1165 Type 2 diabetes mellitus with hyperglycemia: Secondary | ICD-10-CM | POA: Diagnosis present

## 2020-07-06 DIAGNOSIS — J449 Chronic obstructive pulmonary disease, unspecified: Secondary | ICD-10-CM | POA: Diagnosis present

## 2020-07-06 DIAGNOSIS — J9690 Respiratory failure, unspecified, unspecified whether with hypoxia or hypercapnia: Secondary | ICD-10-CM | POA: Diagnosis not present

## 2020-07-06 DIAGNOSIS — I509 Heart failure, unspecified: Secondary | ICD-10-CM | POA: Diagnosis not present

## 2020-07-06 DIAGNOSIS — I10 Essential (primary) hypertension: Secondary | ICD-10-CM | POA: Diagnosis present

## 2020-07-06 DIAGNOSIS — J811 Chronic pulmonary edema: Secondary | ICD-10-CM | POA: Diagnosis not present

## 2020-07-06 DIAGNOSIS — I4891 Unspecified atrial fibrillation: Secondary | ICD-10-CM | POA: Diagnosis not present

## 2020-07-06 DIAGNOSIS — Z9181 History of falling: Secondary | ICD-10-CM

## 2020-07-06 DIAGNOSIS — R627 Adult failure to thrive: Secondary | ICD-10-CM | POA: Diagnosis present

## 2020-07-06 DIAGNOSIS — R0902 Hypoxemia: Secondary | ICD-10-CM

## 2020-07-06 DIAGNOSIS — E8770 Fluid overload, unspecified: Secondary | ICD-10-CM

## 2020-07-06 DIAGNOSIS — I340 Nonrheumatic mitral (valve) insufficiency: Secondary | ICD-10-CM | POA: Diagnosis not present

## 2020-07-06 DIAGNOSIS — K2289 Other specified disease of esophagus: Secondary | ICD-10-CM | POA: Diagnosis not present

## 2020-07-06 DIAGNOSIS — Z743 Need for continuous supervision: Secondary | ICD-10-CM | POA: Diagnosis not present

## 2020-07-06 DIAGNOSIS — Z87891 Personal history of nicotine dependence: Secondary | ICD-10-CM

## 2020-07-06 DIAGNOSIS — R1310 Dysphagia, unspecified: Secondary | ICD-10-CM | POA: Diagnosis not present

## 2020-07-06 DIAGNOSIS — D649 Anemia, unspecified: Secondary | ICD-10-CM | POA: Diagnosis present

## 2020-07-06 DIAGNOSIS — Z4659 Encounter for fitting and adjustment of other gastrointestinal appliance and device: Secondary | ICD-10-CM | POA: Diagnosis not present

## 2020-07-06 DIAGNOSIS — Z7984 Long term (current) use of oral hypoglycemic drugs: Secondary | ICD-10-CM

## 2020-07-06 DIAGNOSIS — J9601 Acute respiratory failure with hypoxia: Secondary | ICD-10-CM | POA: Diagnosis present

## 2020-07-06 DIAGNOSIS — Z4682 Encounter for fitting and adjustment of non-vascular catheter: Secondary | ICD-10-CM | POA: Diagnosis not present

## 2020-07-06 DIAGNOSIS — J690 Pneumonitis due to inhalation of food and vomit: Secondary | ICD-10-CM | POA: Diagnosis not present

## 2020-07-06 DIAGNOSIS — J90 Pleural effusion, not elsewhere classified: Secondary | ICD-10-CM | POA: Diagnosis not present

## 2020-07-06 DIAGNOSIS — F0280 Dementia in other diseases classified elsewhere without behavioral disturbance: Secondary | ICD-10-CM | POA: Diagnosis present

## 2020-07-06 DIAGNOSIS — E1122 Type 2 diabetes mellitus with diabetic chronic kidney disease: Secondary | ICD-10-CM | POA: Diagnosis present

## 2020-07-06 DIAGNOSIS — Z7982 Long term (current) use of aspirin: Secondary | ICD-10-CM

## 2020-07-06 DIAGNOSIS — Z96659 Presence of unspecified artificial knee joint: Secondary | ICD-10-CM | POA: Diagnosis present

## 2020-07-06 DIAGNOSIS — Z79899 Other long term (current) drug therapy: Secondary | ICD-10-CM

## 2020-07-06 DIAGNOSIS — Z781 Physical restraint status: Secondary | ICD-10-CM

## 2020-07-06 DIAGNOSIS — R54 Age-related physical debility: Secondary | ICD-10-CM | POA: Diagnosis present

## 2020-07-06 DIAGNOSIS — N183 Chronic kidney disease, stage 3 unspecified: Secondary | ICD-10-CM | POA: Diagnosis present

## 2020-07-06 DIAGNOSIS — Z1621 Resistance to vancomycin: Secondary | ICD-10-CM | POA: Diagnosis present

## 2020-07-06 DIAGNOSIS — I700 Atherosclerosis of aorta: Secondary | ICD-10-CM | POA: Diagnosis not present

## 2020-07-06 DIAGNOSIS — R296 Repeated falls: Secondary | ICD-10-CM | POA: Diagnosis present

## 2020-07-06 DIAGNOSIS — Z993 Dependence on wheelchair: Secondary | ICD-10-CM

## 2020-07-06 DIAGNOSIS — J80 Acute respiratory distress syndrome: Secondary | ICD-10-CM | POA: Diagnosis not present

## 2020-07-06 HISTORY — DX: Pneumonia, unspecified organism: J18.9

## 2020-07-06 LAB — LACTIC ACID, PLASMA: Lactic Acid, Venous: 1.2 mmol/L (ref 0.5–1.9)

## 2020-07-06 LAB — CBC
HCT: 34.6 % — ABNORMAL LOW (ref 36.0–46.0)
Hemoglobin: 10.5 g/dL — ABNORMAL LOW (ref 12.0–15.0)
MCH: 27.7 pg (ref 26.0–34.0)
MCHC: 30.3 g/dL (ref 30.0–36.0)
MCV: 91.3 fL (ref 80.0–100.0)
Platelets: 250 10*3/uL (ref 150–400)
RBC: 3.79 MIL/uL — ABNORMAL LOW (ref 3.87–5.11)
RDW: 16.7 % — ABNORMAL HIGH (ref 11.5–15.5)
WBC: 13.7 10*3/uL — ABNORMAL HIGH (ref 4.0–10.5)
nRBC: 0 % (ref 0.0–0.2)

## 2020-07-06 LAB — BASIC METABOLIC PANEL
Anion gap: 11 (ref 5–15)
BUN: 45 mg/dL — ABNORMAL HIGH (ref 8–23)
CO2: 28 mmol/L (ref 22–32)
Calcium: 9.5 mg/dL (ref 8.9–10.3)
Chloride: 93 mmol/L — ABNORMAL LOW (ref 98–111)
Creatinine, Ser: 2.2 mg/dL — ABNORMAL HIGH (ref 0.44–1.00)
GFR, Estimated: 21 mL/min — ABNORMAL LOW (ref >60)
Glucose, Bld: 102 mg/dL — ABNORMAL HIGH (ref 70–99)
Potassium: 5.5 mmol/L — ABNORMAL HIGH (ref 3.5–5.1)
Sodium: 132 mmol/L — ABNORMAL LOW (ref 135–145)

## 2020-07-06 LAB — I-STAT VENOUS BLOOD GAS, ED
Acid-Base Excess: 0 mmol/L (ref 0.0–2.0)
Bicarbonate: 28.4 mmol/L — ABNORMAL HIGH (ref 20.0–28.0)
Calcium, Ion: 1.17 mmol/L (ref 1.15–1.40)
HCT: 32 % — ABNORMAL LOW (ref 36.0–46.0)
Hemoglobin: 10.9 g/dL — ABNORMAL LOW (ref 12.0–15.0)
O2 Saturation: 94 %
Potassium: 5.2 mmol/L — ABNORMAL HIGH (ref 3.5–5.1)
Sodium: 133 mmol/L — ABNORMAL LOW (ref 135–145)
TCO2: 30 mmol/L (ref 22–32)
pCO2, Ven: 64.9 mmHg — ABNORMAL HIGH (ref 44.0–60.0)
pH, Ven: 7.249 — ABNORMAL LOW (ref 7.250–7.430)
pO2, Ven: 86 mmHg — ABNORMAL HIGH (ref 32.0–45.0)

## 2020-07-06 LAB — BRAIN NATRIURETIC PEPTIDE: B Natriuretic Peptide: 564.9 pg/mL — ABNORMAL HIGH (ref 0.0–100.0)

## 2020-07-06 LAB — HEPATIC FUNCTION PANEL
ALT: 50 U/L — ABNORMAL HIGH (ref 0–44)
AST: 85 U/L — ABNORMAL HIGH (ref 15–41)
Albumin: 3.2 g/dL — ABNORMAL LOW (ref 3.5–5.0)
Alkaline Phosphatase: 131 U/L — ABNORMAL HIGH (ref 38–126)
Bilirubin, Direct: 0.1 mg/dL (ref 0.0–0.2)
Indirect Bilirubin: 0.5 mg/dL (ref 0.3–0.9)
Total Bilirubin: 0.6 mg/dL (ref 0.3–1.2)
Total Protein: 6.6 g/dL (ref 6.5–8.1)

## 2020-07-06 LAB — URINALYSIS, ROUTINE W REFLEX MICROSCOPIC
Bilirubin Urine: NEGATIVE
Glucose, UA: NEGATIVE mg/dL
Hgb urine dipstick: NEGATIVE
Ketones, ur: NEGATIVE mg/dL
Nitrite: NEGATIVE
Protein, ur: 30 mg/dL — AB
Specific Gravity, Urine: 1.018 (ref 1.005–1.030)
pH: 5 (ref 5.0–8.0)

## 2020-07-06 LAB — PROTIME-INR
INR: 1.1 (ref 0.8–1.2)
Prothrombin Time: 14.2 seconds (ref 11.4–15.2)

## 2020-07-06 LAB — APTT: aPTT: 30 seconds (ref 24–36)

## 2020-07-06 LAB — RESP PANEL BY RT-PCR (FLU A&B, COVID) ARPGX2
Influenza A by PCR: NEGATIVE
Influenza B by PCR: NEGATIVE
SARS Coronavirus 2 by RT PCR: NEGATIVE

## 2020-07-06 LAB — CBG MONITORING, ED: Glucose-Capillary: 130 mg/dL — ABNORMAL HIGH (ref 70–99)

## 2020-07-06 MED ORDER — METHYLPREDNISOLONE SODIUM SUCC 125 MG IJ SOLR
80.0000 mg | Freq: Once | INTRAMUSCULAR | Status: AC
  Administered 2020-07-06: 80 mg via INTRAVENOUS
  Filled 2020-07-06: qty 2

## 2020-07-06 MED ORDER — SODIUM CHLORIDE 0.9 % IV SOLN
2.0000 g | Freq: Once | INTRAVENOUS | Status: AC
  Administered 2020-07-06: 2 g via INTRAVENOUS
  Filled 2020-07-06: qty 2

## 2020-07-06 MED ORDER — INSULIN ASPART 100 UNIT/ML ~~LOC~~ SOLN
0.0000 [IU] | SUBCUTANEOUS | Status: DC
  Administered 2020-07-06 – 2020-07-07 (×4): 1 [IU] via SUBCUTANEOUS
  Administered 2020-07-08: 2 [IU] via SUBCUTANEOUS
  Administered 2020-07-08: 1 [IU] via SUBCUTANEOUS
  Administered 2020-07-08: 2 [IU] via SUBCUTANEOUS
  Administered 2020-07-08 – 2020-07-09 (×4): 1 [IU] via SUBCUTANEOUS
  Administered 2020-07-10: 2 [IU] via SUBCUTANEOUS
  Administered 2020-07-10: 1 [IU] via SUBCUTANEOUS
  Administered 2020-07-10: 2 [IU] via SUBCUTANEOUS
  Administered 2020-07-10: 1 [IU] via SUBCUTANEOUS
  Administered 2020-07-10 – 2020-07-11 (×3): 2 [IU] via SUBCUTANEOUS
  Administered 2020-07-12: 1 [IU] via SUBCUTANEOUS
  Administered 2020-07-12 – 2020-07-13 (×4): 2 [IU] via SUBCUTANEOUS
  Administered 2020-07-13: 3 [IU] via SUBCUTANEOUS
  Administered 2020-07-13: 1 [IU] via SUBCUTANEOUS
  Administered 2020-07-13: 2 [IU] via SUBCUTANEOUS
  Administered 2020-07-13: 3 [IU] via SUBCUTANEOUS
  Administered 2020-07-13: 1 [IU] via SUBCUTANEOUS
  Administered 2020-07-14 – 2020-07-15 (×8): 2 [IU] via SUBCUTANEOUS
  Administered 2020-07-15: 3 [IU] via SUBCUTANEOUS
  Administered 2020-07-15 (×3): 2 [IU] via SUBCUTANEOUS
  Administered 2020-07-16: 3 [IU] via SUBCUTANEOUS
  Administered 2020-07-16 (×2): 2 [IU] via SUBCUTANEOUS
  Administered 2020-07-16: 3 [IU] via SUBCUTANEOUS
  Administered 2020-07-16 – 2020-07-17 (×2): 2 [IU] via SUBCUTANEOUS
  Administered 2020-07-17: 5 [IU] via SUBCUTANEOUS
  Administered 2020-07-17: 2 [IU] via SUBCUTANEOUS
  Administered 2020-07-17: 3 [IU] via SUBCUTANEOUS
  Administered 2020-07-18: 1 [IU] via SUBCUTANEOUS
  Administered 2020-07-18: 2 [IU] via SUBCUTANEOUS
  Administered 2020-07-18: 1 [IU] via SUBCUTANEOUS

## 2020-07-06 MED ORDER — NOREPINEPHRINE 4 MG/250ML-% IV SOLN
0.0000 ug/min | INTRAVENOUS | Status: DC
  Administered 2020-07-06: 2 ug/min via INTRAVENOUS
  Filled 2020-07-06: qty 250

## 2020-07-06 MED ORDER — VANCOMYCIN VARIABLE DOSE PER UNSTABLE RENAL FUNCTION (PHARMACIST DOSING): Status: DC

## 2020-07-06 MED ORDER — LACTATED RINGERS IV BOLUS
1000.0000 mL | Freq: Once | INTRAVENOUS | Status: AC
  Administered 2020-07-06: 1000 mL via INTRAVENOUS

## 2020-07-06 MED ORDER — CEFEPIME HCL 2 G IJ SOLR
2.0000 g | INTRAMUSCULAR | Status: AC
  Administered 2020-07-07 – 2020-07-11 (×5): 2 g via INTRAVENOUS
  Filled 2020-07-06 (×5): qty 2

## 2020-07-06 MED ORDER — VANCOMYCIN HCL 1250 MG/250ML IV SOLN
1250.0000 mg | Freq: Once | INTRAVENOUS | Status: AC
  Administered 2020-07-07: 1250 mg via INTRAVENOUS
  Filled 2020-07-06: qty 250

## 2020-07-06 MED ORDER — SODIUM CHLORIDE 0.9 % IV SOLN
500.0000 mg | INTRAVENOUS | Status: DC
  Administered 2020-07-06 – 2020-07-08 (×3): 500 mg via INTRAVENOUS
  Filled 2020-07-06 (×4): qty 500

## 2020-07-06 MED ORDER — SODIUM CHLORIDE 0.9 % IV SOLN
2.0000 g | INTRAVENOUS | Status: DC
  Administered 2020-07-06: 2 g via INTRAVENOUS
  Filled 2020-07-06: qty 20

## 2020-07-06 MED ORDER — LACTATED RINGERS IV BOLUS
500.0000 mL | Freq: Once | INTRAVENOUS | Status: AC
  Administered 2020-07-06: 500 mL via INTRAVENOUS

## 2020-07-06 MED ORDER — IPRATROPIUM-ALBUTEROL 0.5-2.5 (3) MG/3ML IN SOLN
3.0000 mL | Freq: Four times a day (QID) | RESPIRATORY_TRACT | Status: DC | PRN
  Administered 2020-07-06 – 2020-07-07 (×3): 3 mL via RESPIRATORY_TRACT
  Filled 2020-07-06 (×2): qty 3

## 2020-07-06 MED ORDER — LACTATED RINGERS IV SOLN: INTRAVENOUS | Status: AC

## 2020-07-06 NOTE — ED Triage Notes (Signed)
Pt to triage via [facility] from [facility] in [place].  Reports SOB x 5 days.  Pt was diagnosed with pneumonia but there was a delay getting antibiotics until today.  O2 sats 79% on room air on EMS arrival.  Pt 94% on 10 liters. 18g LAC

## 2020-07-06 NOTE — Consult Note (Signed)
NAME:  Hannah [name], MRN:  [id number], DOB:  04-30-1932, LOS: 0 ADMISSION DATE:  07/01/2020, CONSULTATION DATE: 07/12/2020 REFERRING MD: Dr. Dina Rich from ED, CHIEF COMPLAINT: Shortness of breath cough and altered mental status  Brief History:  87 year old female from nursing home coming in with sepsis due to multifocal pneumonia and acute hypoxic respiratory failure.  History of Present Illness:  Patient is encephalopathic so most of the history is taken from medical records 87 year old female with COPD, diabetes and chronic systolic and diastolic congestive heart failure who was brought into the emergency department from nursing home with a complaint of shortness of breath cough and altered mental status.  As per records patient was diagnosed with a pneumonia few days ago but unfortunately could not get antibiotics, got worse became short of breath when EMS was called her O2 sat was 79% on room air, she was placed on nonrebreather mask with improvement O2 sat 100% and she was brought to the emergency department.  Past Medical History:  COPD Diabetes type 2 Hyperlipidemia Hypertension Chronic systolic/diastolic congestive heart failure Hyperlipidemia  Significant Hospital Events:    Consults:  PCCM  Procedures:    Significant Diagnostic Tests:  1/8: X-ray chest: Consistent with multifocal pneumonia right more than left  Micro Data:  1/8 influenza/Covid PCR negative 1/8 blood cultures > 1/8 urine culture >  Antimicrobials:  Ceftriaxone 1/8 > Azithromycin 1/8 >  Interim History / Subjective:    Objective   Blood pressure (!) 83/58, pulse 88, temperature 98.7 F (37.1 C), temperature source Oral, resp. rate 14, SpO2 100 %.        Intake/Output Summary (Last 24 hours) at 07/09/2020 1948 Last data filed at 06/29/2020 1846 Gross per 24 hour  Intake 1346.76 ml  Output --  Net 1346.76 ml   There were no vitals filed for this visit.  Examination:   Physical  exam: General: Chronically ill-appearing Caucasian female, on nonrebreather mask HEENT: [place]/AT, eyes anicteric.  Severely dry mucous membranes Neuro: Lethargic, opens eyes with vocal stimuli, following simple commands.  Moving all 4 extremities antigravity.  Chest: Crackles on the right side, bilateral wheezes all over, no rhonchi Heart: Tachycardic, regular rhythm, no murmurs or gallops Abdomen: Soft, nontender, nondistended, bowel sounds present Skin: No rash   Resolved Hospital Problem list     Assessment & Plan:  Acute hypoxic respiratory failure due to multifocal pneumonia Sepsis (present on admission) Chronic systolic/diastolic heart failure Acute metabolic encephalopathy Acute kidney injury, likely prerenal Hyperkalemia  Please give 1 more liter of IV fluid with LR, then continue maintenance fluid at 125 an hour for next 12 hours Send urine Legionella and pneumococcal antigen Continue IV ceftriaxone and azithromycin Follow-up respiratory, urine and blood cultures Continue oxygen to maintain O2 sat 90 -95% Clinically patient does not look like an acute exacerbation of CHF, in fact she looks severely dehydrated Monitor serum creatinine Closely monitor serum potassium and repeat once IV resuscitated   Currently patient does not meet criteria for admission to ICU, PCCM will sign off please call with question  Best practice (evaluated daily)  Diet: NPO, speech and swallow evaluation in the morning Pain/Anxiety/Delirium protocol (if indicated): N/A VAP protocol (if indicated): N/A DVT prophylaxis: Per primary team GI prophylaxis: Per primary team Glucose control: SSI Mobility: Bedrest for now Disposition: Per primary team  Goals of Care:  Last date of multidisciplinary goals of care discussion: 1/9 (pending) Family and staff present:  Summary of discussion:  Follow up goals of care  discussion due:  Code Status: Full code  Labs   CBC: Recent Labs  Lab  07/16/2020 1455 07/26/2020 1855  WBC 13.7*  --   HGB 10.5* 10.9*  HCT 34.6* 32.0*  MCV 91.3  --   PLT 250  --     Basic Metabolic Panel: Recent Labs  Lab 07/23/2020 1455 07/01/2020 1855  NA 132* 133*  K 5.5* 5.2*  CL 93*  --   CO2 28  --   GLUCOSE 102*  --   BUN 45*  --   CREATININE 2.20*  --   CALCIUM 9.5  --    GFR: CrCl cannot be calculated (Unknown ideal weight.). Recent Labs  Lab 07/11/2020 1455 07/03/2020 1555  WBC 13.7*  --   LATICACIDVEN  --  1.2    Liver Function Tests: Recent Labs  Lab 07/11/2020 1555  AST 85*  ALT 50*  ALKPHOS 131*  BILITOT 0.6  PROT 6.6  ALBUMIN 3.2*   No results for input(s): LIPASE, AMYLASE in the last 168 hours. No results for input(s): AMMONIA in the last 168 hours.  ABG    Component Value Date/Time   HCO3 28.4 (H) 07/03/2020 1855   TCO2 30 07/25/2020 1855   O2SAT 94.0 07/24/2020 1855     Coagulation Profile: Recent Labs  Lab 07/22/2020 1555  INR 1.1    Cardiac Enzymes: No results for input(s): CKTOTAL, CKMB, CKMBINDEX, TROPONINI in the last 168 hours.  HbA1C: No results found for: HGBA1C  CBG: No results for input(s): GLUCAP in the last 168 hours.  Review of Systems:   Unable to obtain as patient is encephalopathic  Past Medical History:  She,  has a past medical history of COPD (chronic obstructive pulmonary disease) ([place]), DM2 (diabetes mellitus, type 2) ([place]), Exertional dyspnea, GERD (gastroesophageal reflux disease), HLD (hyperlipidemia), HTN (hypertension), and Pneumonia.   Surgical History:   Past Surgical History:  Procedure Laterality Date  . BRAIN SURGERY    . CHOLECYSTECTOMY    . L arm surgery    . TONSILLECTOMY    . TOTAL KNEE ARTHROPLASTY       Social History:   reports that she quit smoking about 10 years ago. She has a 30.00 pack-year smoking history. She does not have any smokeless tobacco history on file. She reports that she does not drink alcohol.   Family History:  Her family history  includes Heart attack in an other family member; Heart disease in an other family member.   Allergies Allergies  Allergen Reactions  . Codeine Nausea And Vomiting  . Sulfa Antibiotics Nausea And Vomiting     Home Medications  Prior to Admission medications   Medication Sig Start Date End Date Taking? Authorizing Provider  acetaminophen (TYLENOL) 325 MG tablet Take 650 mg by mouth every 8 (eight) hours as needed (pain).   Yes [provider]  acetaminophen (TYLENOL) 500 MG tablet Take 500 mg by mouth 2 (two) times daily.   Yes [provider]  albuterol (VENTOLIN HFA) 108 (90 Base) MCG/ACT inhaler Inhale 2 puffs into the lungs every 4 (four) hours as needed for wheezing or shortness of breath.   Yes [provider]  amitriptyline (ELAVIL) 25 MG tablet Take 25 mg by mouth at bedtime.   Yes [provider]  Ascorbic Acid (VITAMIN C) 1000 MG tablet Take 1,000 mg by mouth 2 (two) times daily.   Yes [provider]  aspirin EC 81 MG tablet Take 81 mg by mouth daily.  Swallow whole.   Yes [provider]  azithromycin (ZITHROMAX) 250 MG tablet Take 250-500 mg by mouth See admin instructions. Take 2 tablets (500 mg) by mouth 1st day, then take 1 tablet (250 mg) daily on days 2-5   Yes [provider]  benzonatate (TESSALON) 100 MG capsule Take 100 mg by mouth every 8 (eight) hours as needed for cough.   Yes [provider]  bethanechol (URECHOLINE) 10 MG tablet Take 10 mg by mouth 2 (two) times daily.   Yes [provider]  Calcium Carb-Cholecalciferol (CALCIUM 600+D) 600-800 MG-UNIT TABS Take 1 tablet by mouth 2 (two) times daily.   Yes [provider]  carvedilol (COREG) 3.125 MG tablet Take 3.125 mg by mouth 2 (two) times daily.   Yes [provider]  dicyclomine (BENTYL) 10 MG capsule Take 10 mg by mouth 2 (two) times daily.   Yes [provider]  Ensure (ENSURE) Take 237 mLs by mouth 3  (three) times daily.   Yes [provider]  ferrous sulfate 325 (65 FE) MG tablet Take 325 mg by mouth 2 (two) times daily.   Yes [provider]  fluticasone (FLONASE) 50 MCG/ACT nasal spray Place 1 spray into both nostrils daily.   Yes [provider]  furosemide (LASIX) 40 MG tablet Take 40 mg by mouth daily.   Yes [provider]  ipratropium (ATROVENT) 0.02 % nebulizer solution Take 0.5 mg by nebulization 3 (three) times daily.   Yes [provider]  levothyroxine (SYNTHROID) 25 MCG tablet Take 25 mcg by mouth daily before breakfast.   Yes [provider]  loratadine (CLARITIN) 10 MG tablet Take 10 mg by mouth daily.   Yes [provider]  LORazepam (ATIVAN) 0.5 MG tablet Take 0.5 mg by mouth every 6 (six) hours as needed for anxiety.   Yes [provider]  meclizine (ANTIVERT) 25 MG tablet Take 25 mg by mouth every 6 (six) hours as needed for dizziness.   Yes [provider]  metFORMIN (GLUCOPHAGE) 500 MG tablet Take 500 mg by mouth 2 (two) times daily.   Yes [provider]  mirabegron ER (MYRBETRIQ) 25 MG TB24 tablet Take 25 mg by mouth at bedtime.   Yes [provider]  montelukast (SINGULAIR) 10 MG tablet Take 10 mg by mouth at bedtime.   Yes [provider]  morphine (ROXANOL) 20 MG/ML concentrated solution Take 5 mg by mouth every 2 (two) hours as needed for severe pain or shortness of breath.   Yes [provider]  omeprazole (PRILOSEC) 40 MG capsule Take 40 mg by mouth daily before breakfast.   Yes [provider]  ondansetron (ZOFRAN) 4 MG tablet Take 4 mg by mouth every 6 (six) hours as needed for nausea or vomiting.   Yes [provider]  OXYGEN Inhale 3 L into the lungs continuous.   Yes [provider]  PARoxetine (PAXIL) 20 MG tablet Take 20 mg by mouth daily.   Yes [provider]  Polyethyl Glycol-Propyl Glycol (SYSTANE)  0.4-0.3 % SOLN Place 1 drop into both eyes every 12 (twelve) hours as needed (dry eyes).   Yes [provider]  potassium chloride SA (KLOR-CON) 20 MEQ tablet Take 20 mEq by mouth daily as needed (with each dose of torsemide).   Yes [provider]  promethazine-dextromethorphan (PROMETHAZINE-DM) 6.25-15 MG/5ML syrup Take 5 mLs by mouth every 6 (six) hours as needed for cough.   Yes [provider]  senna-docusate (SENOKOT-S) 8.6-50 MG tablet Take 1 tablet by mouth every 12 (twelve) hours as needed (constipation).   Yes [provider]  tiZANidine (ZANAFLEX) 2 MG tablet Take 2 mg by mouth every 12 (twelve) hours as needed for muscle spasms.   Yes [provider]  torsemide (DEMADEX) 10 MG tablet Take 10 mg by mouth daily as needed (swelling).   Yes [provider]  traMADol (ULTRAM) 50 MG tablet Take 50 mg by mouth every 12 (twelve) hours as needed (chronic pain).   Yes [provider]     Total critical care time: 34 minutes  Performed by: [place] care time was exclusive of separately billable procedures and treating other patients.   Critical care was necessary to treat or prevent imminent or life-threatening deterioration.   Critical care was time spent personally by me on the following activities: development of treatment plan with patient and/or surrogate as well as nursing, discussions with consultants, evaluation of patient's response to treatment, examination of patient, obtaining history from patient or surrogate, ordering and performing treatments and interventions, ordering and review of laboratory studies, ordering and review of radiographic studies, pulse oximetry and re-evaluation of patient's condition.   Jacky Kindle MD [facility] Pager: [phone] Mobile: [phone]

## 2020-07-06 NOTE — H&P (Incomplete)
LURENE ROBLEY [name]:[id number] DOB: Apr 26, 1932 DOA: 07/13/2020     PCP: Emmaline Kluver, MD   Outpatient Specialists:     NEphrology:  Dr. Venetia Maxon    Patient arrived to ER on 07/05/2020 at 1353 Referred by Attending Horton, Alvin Critchley, DO   Patient coming from:   From facility  [facility] in [place], memory care  Chief Complaint:   Chief Complaint  Patient presents with  . Shortness of Breath    HPI: Hannah [name] is a 87 y.o. female with medical history significant of DM2, COPD on 3L at baseline, GERD, HLD, HTN, dementia    Presented with   5 d hx of SOB, cough and a fever she was recently diagnosed with PNA but only started on antibiotics today. O2 saturation was 79 on RA Now needing up to 10 L   at baseline wheelchair bound, knows her family her dementia has been minimal    fever, shortness of breath   Has   been vaccinated against COVID and boosted   Initial COVID TEST  NEGATIVE   Lab Results  Component Value Date   [place] NEGATIVE 07/10/2020    Regarding pertinent Chronic problems:   Hyperlipidemia -  on statins Lipitor, LOfibra Lipid Panel  No results found for: CHOL, TRIG, HDL, CHOLHDL, VLDL, LDLCALC, LDLDIRECT, LABVLDL   HTN on Diovan   DM 2 -   diet controlled     COPD - not  followed by pulmonology    on baseline oxygen  3L,      CKD stage IIIb-   CrCl cannot be calculated (Unknown ideal weight.).  Lab Results  Component Value Date   CREATININE 2.20 (H) 07/09/2020     Dementia -    Chronic anemia - baseline hg Hemoglobin & Hematocrit on iron supliment Recent Labs    07/22/2020 1455 07/17/2020 1855  HGB 10.5* 10.9*   While in ER: CXR showing hazy pulmonary infiltrates Right >Left initially hypotensive needing levophed now off    Hospitalist was called for admission for CAP admission with acute respiratory failure  The following Work up has been ordered so far:  Orders Placed This Encounter  Procedures  . Resp Panel by RT-PCR  (Flu A&B, Covid) Nasopharyngeal Swab  . Blood culture (routine single)  . Urine culture  . Culture, blood (single)  . DG Chest Portable 1 View  . Basic metabolic panel  . CBC  . Lactic acid, plasma  . Protime-INR  . APTT  . Urinalysis, Routine w reflex microscopic  . Hepatic function panel  . Brain natriuretic peptide  . Diet NPO time specified  . Cardiac monitoring  . If O2 Sat <94% administer O2 at 2 liters/minute via nasal cannula  . Document height and weight  . Assess and Document Glasgow Coma Scale  . Document vital signs within 1-hour of fluid bolus completion.  Notify provider of abnormal vital signs despite fluid resuscitation.  . Refer to Sidebar Report: Sepsis Bundle ED/IP  . Notify provider for difficulties obtaining IV access  . Initiate Carrier Fluid Protocol  . DO NOT delay antibiotics if unable to obtain blood culture.  . Weigh patient  . In and Out Cath  . pharmacy consult  . pharmacy consult  . Consult for [place] Admission  ALL PATIENTS BEING ADMITTED/HAVING PROCEDURES NEED COVID-19 SCREENING  . Consult to intensivist  ALL PATIENTS BEING ADMITTED/HAVING PROCEDURES NEED COVID-19 SCREENING  . Airborne and Contact precautions  . Pulse oximetry,  continuous  . I-Stat venous blood gas, [facility] ED)  . ED EKG  . EKG 12-Lead  . Saline lock IV  . Insert peripheral IV X 1  . Insert 2nd peripheral IV if not already present.    Following Medications were ordered in ER: Medications  lactated ringers infusion ( Intravenous New Bag/Given 2020-07-16 1617)  cefTRIAXone (ROCEPHIN) 2 g in sodium chloride 0.9 % 100 mL IVPB (0 g Intravenous Stopped 07/16/2020 1656)  azithromycin (ZITHROMAX) 500 mg in sodium chloride 0.9 % 250 mL IVPB (0 mg Intravenous Stopped 07/16/2020 1846)  norepinephrine (LEVOPHED) 4mg  in 266mL premix infusion (2 mcg/min Intravenous New Bag/Given 07-16-20 1843)  lactated ringers bolus 500 mL (0 mLs Intravenous Stopped July 16, 2020 1700)  lactated ringers bolus 500 mL  (0 mLs Intravenous Stopped 2020/07/16 1720)  methylPREDNISolone sodium succinate (SOLU-MEDROL) 125 mg/2 mL injection 80 mg (80 mg Intravenous Given 2020/07/16 1623)   Significant initial  Findings: Abnormal Labs Reviewed  BASIC METABOLIC PANEL - Abnormal; Notable for the following components:      Result Value   Sodium 132 (*)    Potassium 5.5 (*)    Chloride 93 (*)    Glucose, Bld 102 (*)    BUN 45 (*)    Creatinine, Ser 2.20 (*)    GFR, Estimated 21 (*)    All other components within normal limits  CBC - Abnormal; Notable for the following components:   WBC 13.7 (*)    RBC 3.79 (*)    Hemoglobin 10.5 (*)    HCT 34.6 (*)    RDW 16.7 (*)    All other components within normal limits  URINALYSIS, ROUTINE W REFLEX MICROSCOPIC - Abnormal; Notable for the following components:   Color, Urine AMBER (*)    APPearance CLOUDY (*)    Protein, ur 30 (*)    Leukocytes,Ua TRACE (*)    Bacteria, UA RARE (*)    All other components within normal limits  HEPATIC FUNCTION PANEL - Abnormal; Notable for the following components:   Albumin 3.2 (*)    AST 85 (*)    ALT 50 (*)    Alkaline Phosphatase 131 (*)    All other components within normal limits  BRAIN NATRIURETIC PEPTIDE - Abnormal; Notable for the following components:   B Natriuretic Peptide 564.9 (*)    All other components within normal limits  I-STAT VENOUS BLOOD GAS, ED - Abnormal; Notable for the following components:   pH, Ven 7.249 (*)    pCO2, Ven 64.9 (*)    pO2, Ven 86.0 (*)    Bicarbonate 28.4 (*)    Sodium 133 (*)    Potassium 5.2 (*)    HCT 32.0 (*)    Hemoglobin 10.9 (*)    All other components within normal limits   Otherwise labs showing:  Recent Labs  Lab 2020-07-16 1455 07-16-2020 1855  NA 132* 133*  K 5.5* 5.2*  CO2 28  --   GLUCOSE 102*  --   BUN 45*  --   CREATININE 2.20*  --   CALCIUM 9.5  --     Cr    Lab Results  Component Value Date   CREATININE 2.20 (H) 07/16/20    Recent Labs  Lab  Jul 16, 2020 1555  AST 85*  ALT 50*  ALKPHOS 131*  BILITOT 0.6  PROT 6.6  ALBUMIN 3.2*   Lab Results  Component Value Date   CALCIUM 9.5 2020/07/16      WBC  Component Value Date/Time   WBC 13.7 (H) 07/10/2020 1455    Plt: Lab Results  Component Value Date   PLT 250 07-10-20     Lactic Acid, Venous    Component Value Date/Time   LATICACIDVEN 1.2 July 10, 2020 1555    Procalcitonin *** Ordered   COVID-19 Labs  No results for input(s): DDIMER, FERRITIN, LDH, CRP in the last 72 hours.  Lab Results  Component Value Date   SARSCOV2NAA NEGATIVE Jul 10, 2020      Arterial ***Venous  Blood Gas result:  pH *** pCO2 ***; pO2 ***;     %O2 Sat ***.  ABG    Component Value Date/Time   HCO3 28.4 (H) 07-10-2020 1855   TCO2 30 2020-07-10 1855   O2SAT 94.0 July 10, 2020 1855      HG/HCT * stable,  Down *Up from baseline see below    Component Value Date/Time   HGB 10.9 (L) 10-Jul-2020 1855   HCT 32.0 (L) July 10, 2020 1855   MCV 91.3 07-10-20 1455    No results for input(s): LIPASE, AMYLASE in the last 168 hours. No results for input(s): AMMONIA in the last 168 hours.    Troponin ***ordered Cardiac Panel (last 3 results) No results for input(s): CKTOTAL, CKMB, TROPONINI, RELINDX in the last 72 hours.     ECG: Ordered Personally reviewed by me showing: HR : 101 Rhythm:   A.fib. W RVR, vs Sinus with frequent PAC   no evidence of ischemic changes QTC 490   BNP (last 3 results) Recent Labs    07/10/20 1540  BNP 564.9*    DM  labs:  HbA1C: No results for input(s): HGBA1C in the last 8760 hours.     CBG (last 3)  No results for input(s): GLUCAP in the last 72 hours.     UA   no evidence of UTI      Urine analysis:    Component Value Date/Time   COLORURINE AMBER (A) 07/10/2020 1637   APPEARANCEUR CLOUDY (A) 07-10-2020 1637   LABSPEC 1.018 07/10/20 1637   PHURINE 5.0 2020-07-10 1637   GLUCOSEU NEGATIVE 07-10-20 1637   HGBUR NEGATIVE 07/10/20  1637   BILIRUBINUR NEGATIVE 07/10/2020 1637   KETONESUR NEGATIVE 07-10-20 1637   PROTEINUR 30 (A) 10-Jul-2020 1637   NITRITE NEGATIVE 07/10/20 1637   LEUKOCYTESUR TRACE (A) 10-Jul-2020 1637       Ordered   CXR - Hazy pulmonary infiltrate, right worse than left. This could be due to viral or bacterial disease. No dense consolidation or collapse.    ED Triage Vitals  Enc Vitals Group     BP 07-10-2020 1402 104/60     Pulse Rate 2020/07/10 1402 (!) 120     Resp 07-10-2020 1402 18     Temp 07/10/20 1402 98.7 F (37.1 C)     Temp Source 2020/07/10 1402 Oral     SpO2 07/10/2020 1402 100 %     Weight --      Height --      Head Circumference --      Peak Flow --      Pain Score Jul 10, 2020 1413 0     Pain Loc --      Pain Edu? --      Excl. in GC? --   TMAX(24)@       Latest  Blood pressure (!) 83/58, pulse 88, temperature 98.7 F (37.1 C), temperature source Oral, resp. rate 14, SpO2 100 %.    Review of Systems:  Pertinent positives include:  Fevers, chills, fatigue,  non-productive cough, shortness of breath at rest.  dyspnea on exertion Constitutional:  No weight loss, night sweats, weight loss  HEENT:  No headaches, Difficulty swallowing,Tooth/dental problems,Sore throat,  No sneezing, itching, ear ache, nasal congestion, post nasal drip,  Cardio-vascular:  No chest pain, Orthopnea, PND, anasarca, dizziness, palpitations.no Bilateral lower extremity swelling  GI:  No heartburn, indigestion, abdominal pain, nausea, vomiting, diarrhea, change in bowel habits, loss of appetite, melena, blood in stool, hematemesis Resp:  no , No excess mucus, no productive cough, No No coughing up of blood.No change in color of mucus.No wheezing. Skin:  no rash or lesions. No jaundice GU:  no dysuria, change in color of urine, no urgency or frequency. No straining to urinate.  No flank pain.  Musculoskeletal:  No joint pain or no joint swelling. No decreased range of motion. No back pain.   Psych:  No change in mood or affect. No depression or anxiety. No memory loss.  Neuro: no localizing neurological complaints, no tingling, no weakness, no double vision, no gait abnormality, no slurred speech, no confusion  All systems reviewed and apart from [place] all are negative  Past Medical History:   Past Medical History:  Diagnosis Date  . COPD (chronic obstructive pulmonary disease) ([place])   . DM2 (diabetes mellitus, type 2) ([place])   . Exertional dyspnea   . GERD (gastroesophageal reflux disease)   . HLD (hyperlipidemia)   . HTN (hypertension)   . Pneumonia      Past Surgical History:  Procedure Laterality Date  . BRAIN SURGERY    . CHOLECYSTECTOMY    . L arm surgery    . TONSILLECTOMY    . TOTAL KNEE ARTHROPLASTY      Social History:  Ambulatory   wheelchair bound,      reports that she quit smoking about 10 years ago. She has a 30.00 pack-year smoking history. She does not have any smokeless tobacco history on file. She reports that she does not drink alcohol. No history on file for drug use.   Family History:   Family History  Problem Relation Age of Onset  . Heart attack Other   . Heart disease Other     Allergies: Allergies  Allergen Reactions  . Codeine Nausea And Vomiting  . Sulfa Antibiotics Nausea And Vomiting     Prior to Admission medications   Medication Sig Start Date End Date Taking? Authorizing Provider  acetaminophen (TYLENOL) 325 MG tablet Take 650 mg by mouth every 8 (eight) hours as needed (pain).   Yes [provider]  acetaminophen (TYLENOL) 500 MG tablet Take 500 mg by mouth 2 (two) times daily.   Yes [provider]  albuterol (VENTOLIN HFA) 108 (90 Base) MCG/ACT inhaler Inhale 2 puffs into the lungs every 4 (four) hours as needed for wheezing or shortness of breath.   Yes [provider]  amitriptyline (ELAVIL) 25 MG tablet Take 25 mg by mouth at bedtime.   Yes [provider]  Ascorbic Acid  (VITAMIN C) 1000 MG tablet Take 1,000 mg by mouth 2 (two) times daily.   Yes [provider]  aspirin EC 81 MG tablet Take 81 mg by mouth daily. Swallow whole.   Yes [provider]  azithromycin (ZITHROMAX) 250 MG tablet Take 250-500 mg by mouth See admin instructions. Take 2 tablets (500 mg) by mouth 1st day, then take 1 tablet (250 mg) daily on days 2-5  Yes [provider]  benzonatate (TESSALON) 100 MG capsule Take 100 mg by mouth every 8 (eight) hours as needed for cough.   Yes [provider]  bethanechol (URECHOLINE) 10 MG tablet Take 10 mg by mouth 2 (two) times daily.   Yes [provider]  Calcium Carb-Cholecalciferol (CALCIUM 600+D) 600-800 MG-UNIT TABS Take 1 tablet by mouth 2 (two) times daily.   Yes [provider]  carvedilol (COREG) 3.125 MG tablet Take 3.125 mg by mouth 2 (two) times daily.   Yes [provider]  dicyclomine (BENTYL) 10 MG capsule Take 10 mg by mouth 2 (two) times daily.   Yes [provider]  Ensure (ENSURE) Take 237 mLs by mouth 3 (three) times daily.   Yes [provider]  ferrous sulfate 325 (65 FE) MG tablet Take 325 mg by mouth 2 (two) times daily.   Yes [provider]  fluticasone (FLONASE) 50 MCG/ACT nasal spray Place 1 spray into both nostrils daily.   Yes [provider]  furosemide (LASIX) 40 MG tablet Take 40 mg by mouth daily.   Yes [provider]  ipratropium (ATROVENT) 0.02 % nebulizer solution Take 0.5 mg by nebulization 3 (three) times daily.   Yes [provider]  levothyroxine (SYNTHROID) 25 MCG tablet Take 25 mcg by mouth daily before breakfast.   Yes [provider]  loratadine (CLARITIN) 10 MG tablet Take 10 mg by mouth daily.   Yes [provider]  LORazepam (ATIVAN) 0.5 MG tablet Take 0.5 mg by mouth every 6 (six) hours as needed for anxiety.   Yes [provider]  meclizine (ANTIVERT) 25 MG tablet  Take 25 mg by mouth every 6 (six) hours as needed for dizziness.   Yes [provider]  metFORMIN (GLUCOPHAGE) 500 MG tablet Take 500 mg by mouth 2 (two) times daily.   Yes [provider]  mirabegron ER (MYRBETRIQ) 25 MG TB24 tablet Take 25 mg by mouth at bedtime.   Yes [provider]  montelukast (SINGULAIR) 10 MG tablet Take 10 mg by mouth at bedtime.   Yes [provider]  morphine (ROXANOL) 20 MG/ML concentrated solution Take 5 mg by mouth every 2 (two) hours as needed for severe pain or shortness of breath.   Yes [provider]  omeprazole (PRILOSEC) 40 MG capsule Take 40 mg by mouth daily before breakfast.   Yes [provider]  ondansetron (ZOFRAN) 4 MG tablet Take 4 mg by mouth every 6 (six) hours as needed for nausea or vomiting.   Yes [provider]  OXYGEN Inhale 3 L into the lungs continuous.   Yes [provider]  PARoxetine (PAXIL) 20 MG tablet Take 20 mg by mouth daily.   Yes [provider]  Polyethyl Glycol-Propyl Glycol (SYSTANE) 0.4-0.3 % SOLN Place 1 drop into both eyes every 12 (twelve) hours as needed (dry eyes).   Yes [provider]  potassium chloride SA (KLOR-CON) 20 MEQ tablet Take 20 mEq by mouth daily as needed (with each dose of torsemide).   Yes [provider]  promethazine-dextromethorphan (PROMETHAZINE-DM) 6.25-15 MG/5ML syrup Take 5 mLs by mouth every 6 (six) hours as needed for cough.   Yes [provider]  senna-docusate (SENOKOT-S) 8.6-50 MG tablet Take 1 tablet by mouth every 12 (twelve) hours as needed (constipation).   Yes [provider]  tiZANidine (ZANAFLEX) 2 MG tablet Take 2 mg by mouth every 12 (twelve) hours as needed for muscle  spasms.   Yes [provider]  torsemide (DEMADEX) 10 MG tablet Take 10 mg by mouth daily as needed (swelling).   Yes [provider]  traMADol (ULTRAM) 50 MG tablet Take 50 mg by mouth every  12 (twelve) hours as needed (chronic pain).   Yes [provider]   Physical Exam: Vitals with BMI 07/26/2020 [id number]  Systolic 83 83 93  Diastolic 58 65 56  Pulse 88 67 54    1. General:  in No  Acute distress   Chronically ill  -appearing 2. Psychological: Alert and  Oriented to self 3. Head/ENT:    Dry Mucous Membranes                          Head Non traumatic, neck supple                            Poor Dentition 4. SKIN:  decreased Skin turgor,  Skin clean Dry and intact no rash 5. Heart: Regular rate and rhythm no  Murmur, no Rub or gallop 6. Lungs: some bilateral wheezes   7. Abdomen: Soft,  non-tender, Non distended  bowel sounds present 8. Lower extremities: no clubbing, cyanosis, no  edema 9. Neurologically Grossly intact, moving all 4 extremities equally   10. MSK: Normal range of motion   All other LABS:     Recent Labs  Lab 07/27/2020 1455 07/11/2020 1855  WBC 13.7*  --   HGB 10.5* 10.9*  HCT 34.6* 32.0*  MCV 91.3  --   PLT 250  --      Recent Labs  Lab 07/25/2020 1455 07/14/2020 1855  NA 132* 133*  K 5.5* 5.2*  CL 93*  --   CO2 28  --   GLUCOSE 102*  --   BUN 45*  --   CREATININE 2.20*  --   CALCIUM 9.5  --      Recent Labs  Lab 07/03/2020 1555  AST 85*  ALT 50*  ALKPHOS 131*  BILITOT 0.6  PROT 6.6  ALBUMIN 3.2*       Cultures: No results found for: SDES, SPECREQUEST, CULT, REPTSTATUS   Radiological Exams on Admission: DG Chest Portable 1 View  Result Date: 07/23/2020 CLINICAL DATA:  Shortness of breath over the last 5 days. EXAM: PORTABLE CHEST 1 VIEW COMPARISON:  05/10/2020 FINDINGS: Heart size upper limits of normal. Chronic aortic atherosclerotic calcification. Hazy pulmonary infiltrate, more extensive on the right than the left. This could be due to viral or bacterial disease. No effusion. No dense consolidation or collapse. No acute bone finding. Chronic degenerative changes of the shoulders with likely chronic rotator  cuff tears. IMPRESSION: 1. Hazy pulmonary infiltrate, right worse than left. This could be due to viral or bacterial disease. No dense consolidation or collapse. 2. Aortic atherosclerosis. Electronically Signed   By: Nelson Chimes M.D.   On: 07/21/2020 14:44    Chart has been reviewed  Assessment/Plan  87 y.o. female with medical history significant of DM2, COPD on 3L at baseline, GERD, HLD, HTN, dementia Admitted for septic shock due to CAP  Present on Admission: . Septic shock ([place])   Other plan as per orders.  DVT prophylaxis:  SCD *** Lovenox  Heparin?     Code Status:    Code Status: Not on file FULL CODE  as per patient  family  I had personally discussed CODE  STATUS with patient and family       Family Communication:   Family not at  Bedside  plan of care was discussed on the phone with  Son,   Disposition Plan:   *** likely will need placement for rehabilitation                          Back to current facility when stable                            To home once workup is complete and patient is stable  ***Following barriers for discharge:                            Electrolytes corrected                               Anemia corrected                             Pain controlled with PO medications                               Afebrile, white count improving able to transition to PO antibiotics                             Will need to be able to tolerate PO                            Will likely need home health, home O2, set up                           Will need consultants to evaluate patient prior to discharge  ****EXPECT DC tomorrow                    ***Would benefit from PT/OT eval prior to DC  Ordered                   Swallow eval - SLP ordered                   Diabetes care coordinator                   Transition of care consulted                   Nutrition    consulted                  Wound care  consulted                   Palliative care     consulted                   Behavioral health  consulted                    Consults called: PCCM is aware   Admission status:  ED Disposition    ED Disposition Condition [place]: [place] [[id number]]  Level  of Care: Progressive [102]  Admit to Progressive based on following criteria: RESPIRATORY PROBLEMS hypoxemic/hypercapnic respiratory failure that is responsive to NIPPV (BiPAP) or High Flow Nasal Cannula (6-80 lpm). Frequent assessment/intervention, no > Q2 hrs < Q4 hrs, to maintain oxygenation and pulmonary hygiene.  May admit patient to [facility] or [facility] if equivalent level of care is available:: No  Covid Evaluation: Confirmed COVID Negative  Diagnosis: Septic shock [facility]) [[id number]]  Admitting Physician: Toy Baker [[id number]]  Attending Physician: Toy Baker [[id number]]  Estimated length of stay: 3 - 4 days  Certification:: I certify this patient will need inpatient services for at least 2 midnights          inpatient     I Expect 2 midnight stay secondary to severity of patient's current illness need for inpatient interventions justified by the following:   hemodynamic instability despite optimal treatment (tachycardia  Hypotension  tachypnea  hypoxia, hypercapnia)  Severe lab/radiological/exam abnormalities including:    PNEmonia  and extensive comorbidities including:  DM2   CHF   COPD/asthma  CKD  dementia   That are currently affecting medical management.   I expect  patient to be hospitalized for 2 midnights requiring inpatient medical care.  Patient is at high risk for adverse outcome (such as loss of life or disability) if not treated.  Indication for inpatient stay as follows:  Severe change from baseline regarding mental status Hemodynamic instability despite maximal medical therapy,    inability to maintain oral hydration    New or worsening hypoxia  Need for IV antibiotics, IV fluids  IV  anticoagulation    Level of care     SDU tele indefinitely please discontinue once patient no longer qualifies COVID-19 Labs    Lab Results  Component Value Date   [place] NEGATIVE 07-07-20     Precautions: admitted as  Covid Negative      PPE: Used by the provider:   P100  eye Goggles,  Gloves     Aayat Hajjar 07/07/2020, 12:00 AM    [facility]     after 2 AM please page floor coverage PA If 7AM-7PM, please contact the day team taking care of the patient using [email or web address]   Patient was evaluated in the context of the global COVID-19 pandemic, which necessitated consideration that the patient might be at risk for infection with the SARS-CoV-2 virus that causes COVID-19. Institutional protocols and algorithms that pertain to the evaluation of patients at risk for COVID-19 are in a state of rapid change based on information released by regulatory bodies including the CDC and federal and state organizations. These policies and algorithms were followed during the patient's care.

## 2020-07-06 NOTE — ED Provider Notes (Addendum)
[facility] EMERGENCY DEPARTMENT Provider Note   CSN: [id number] Arrival date & time: 07/05/2020  1353     History Chief Complaint  Patient presents with  . Shortness of Breath    Hannah [name] is a 87 y.o. female history includes COPD, Alzheimer's dementia, diabetes, GERD, hypertension, hyperlipidemia.  Patient arrives via EMS today for shortness of breath x5 days, diagnosis of pneumonia, unclear if patient has been receiving antibiotics PTA.  O2 on EMS arrival 79%, patient currently on 10 L nonrebreather.  EMS has left prior to my initial evaluation.  Patient is alert to self only on exam, ill-appearing, short of breath denies any pain on examination, she is able to stand and transfer with nursing staff with some difficulty.  Level 5 caveat altered mental status/dementia.  HPI     Past Medical History:  Diagnosis Date  . COPD (chronic obstructive pulmonary disease) ([place])   . DM2 (diabetes mellitus, type 2) ([place])   . Exertional dyspnea   . GERD (gastroesophageal reflux disease)   . HLD (hyperlipidemia)   . HTN (hypertension)   . Pneumonia     There are no problems to display for this patient.   Past Surgical History:  Procedure Laterality Date  . BRAIN SURGERY    . CHOLECYSTECTOMY    . L arm surgery    . TONSILLECTOMY    . TOTAL KNEE ARTHROPLASTY       OB History   No obstetric history on file.     Family History  Problem Relation Age of Onset  . Heart attack Other   . Heart disease Other     Social History   Tobacco Use  . Smoking status: Former Smoker    Packs/day: 1.00    Years: 30.00    Pack years: 30.00    Quit date: 09/27/2009    Years since quitting: 10.7  Substance Use Topics  . Alcohol use: No    Home Medications Prior to Admission medications   Medication Sig Start Date End Date Taking? Authorizing Provider  acetaminophen (TYLENOL) 325 MG tablet Take 650 mg by mouth every 8 (eight) hours as needed (pain).   Yes  [provider]  acetaminophen (TYLENOL) 500 MG tablet Take 500 mg by mouth 2 (two) times daily.   Yes [provider]  albuterol (VENTOLIN HFA) 108 (90 Base) MCG/ACT inhaler Inhale 2 puffs into the lungs every 4 (four) hours as needed for wheezing or shortness of breath.   Yes [provider]  amitriptyline (ELAVIL) 25 MG tablet Take 25 mg by mouth at bedtime.   Yes [provider]  Ascorbic Acid (VITAMIN C) 1000 MG tablet Take 1,000 mg by mouth 2 (two) times daily.   Yes [provider]  aspirin EC 81 MG tablet Take 81 mg by mouth daily. Swallow whole.   Yes [provider]  azithromycin (ZITHROMAX) 250 MG tablet Take 250-500 mg by mouth See admin instructions. Take 2 tablets (500 mg) by mouth 1st day, then take 1 tablet (250 mg) daily on days 2-5   Yes [provider]  benzonatate (TESSALON) 100 MG capsule Take 100 mg by mouth every 8 (eight) hours as needed for cough.   Yes [provider]  bethanechol (URECHOLINE) 10 MG tablet Take 10 mg by mouth 2 (two) times daily.   Yes [provider]  Calcium Carb-Cholecalciferol (CALCIUM 600+D) 600-800 MG-UNIT TABS Take 1 tablet by mouth 2 (two) times daily.   Yes  [provider]  carvedilol (COREG) 3.125 MG tablet Take 3.125 mg by mouth 2 (two) times daily.   Yes [provider]  dicyclomine (BENTYL) 10 MG capsule Take 10 mg by mouth 2 (two) times daily.   Yes [provider]  Ensure (ENSURE) Take 237 mLs by mouth 3 (three) times daily.   Yes [provider]  ferrous sulfate 325 (65 FE) MG tablet Take 325 mg by mouth 2 (two) times daily.   Yes [provider]  fluticasone (FLONASE) 50 MCG/ACT nasal spray Place 1 spray into both nostrils daily.   Yes [provider]  furosemide (LASIX) 40 MG tablet Take 40 mg by mouth daily.   Yes [provider]  ipratropium (ATROVENT) 0.02 % nebulizer solution Take 0.5 mg by  nebulization 3 (three) times daily.   Yes [provider]  levothyroxine (SYNTHROID) 25 MCG tablet Take 25 mcg by mouth daily before breakfast.   Yes [provider]  loratadine (CLARITIN) 10 MG tablet Take 10 mg by mouth daily.   Yes [provider]  LORazepam (ATIVAN) 0.5 MG tablet Take 0.5 mg by mouth every 6 (six) hours as needed for anxiety.   Yes [provider]  meclizine (ANTIVERT) 25 MG tablet Take 25 mg by mouth every 6 (six) hours as needed for dizziness.   Yes [provider]  metFORMIN (GLUCOPHAGE) 500 MG tablet Take 500 mg by mouth 2 (two) times daily.   Yes [provider]  mirabegron ER (MYRBETRIQ) 25 MG TB24 tablet Take 25 mg by mouth at bedtime.   Yes [provider]  montelukast (SINGULAIR) 10 MG tablet Take 10 mg by mouth at bedtime.   Yes [provider]  morphine (ROXANOL) 20 MG/ML concentrated solution Take 5 mg by mouth every 2 (two) hours as needed for severe pain or shortness of breath.   Yes [provider]  omeprazole (PRILOSEC) 40 MG capsule Take 40 mg by mouth daily before breakfast.   Yes [provider]  ondansetron (ZOFRAN) 4 MG tablet Take 4 mg by mouth every 6 (six) hours as needed for nausea or vomiting.   Yes [provider]  OXYGEN Inhale 3 L into the lungs continuous.   Yes [provider]  PARoxetine (PAXIL) 20 MG tablet Take 20 mg by mouth daily.   Yes [provider]  Polyethyl Glycol-Propyl Glycol (SYSTANE) 0.4-0.3 % SOLN Place 1 drop into both eyes every 12 (twelve) hours as needed (dry eyes).   Yes [provider]  potassium chloride SA (KLOR-CON) 20 MEQ tablet Take 20 mEq by mouth daily as needed (with each dose of torsemide).   Yes [provider]  promethazine-dextromethorphan (PROMETHAZINE-DM) 6.25-15 MG/5ML syrup Take 5 mLs by mouth every 6 (six) hours as needed for cough.   Yes [provider]  senna-docusate  (SENOKOT-S) 8.6-50 MG tablet Take 1 tablet by mouth every 12 (twelve) hours as needed (constipation).   Yes [provider]  tiZANidine (ZANAFLEX) 2 MG tablet Take 2 mg by mouth every 12 (twelve) hours as needed for muscle spasms.   Yes [provider]  torsemide (DEMADEX) 10 MG tablet Take 10 mg by mouth daily as needed (swelling).   Yes [provider]  traMADol (ULTRAM) 50 MG tablet Take 50 mg by mouth every 12 (twelve) hours as needed (chronic pain).   Yes [provider]    Allergies    Codeine and Sulfa antibiotics  Review of Systems  Review of Systems  Unable to perform ROS: Mental status change    Physical Exam Updated Vital Signs BP 104/71   Pulse 100   Temp 98 F (36.7 C)   Resp 15   SpO2 100%   Physical Exam Constitutional:      General: She is not in acute distress.    Appearance: Normal appearance. She is well-developed. She is ill-appearing. She is not diaphoretic.  HENT:     Head: Normocephalic and atraumatic. No abrasion or contusion.     Jaw: There is normal jaw occlusion.     Right Ear: External ear normal.     Left Ear: External ear normal.     Nose: Nose normal.     Mouth/Throat:     Pharynx: Oropharynx is clear.  Eyes:     General: Vision grossly intact. Gaze aligned appropriately.     Conjunctiva/sclera: Conjunctivae normal.     Pupils: Pupils are equal, round, and reactive to light.  Neck:     Trachea: Trachea and phonation normal. No tracheal tenderness or tracheal deviation.  Cardiovascular:     Rate and Rhythm: Normal rate.  Pulmonary:     Effort: Tachypnea and accessory muscle usage present. No respiratory distress.     Breath sounds: Normal air entry. Wheezing and rales present.  Abdominal:     General: There is no distension.     Palpations: Abdomen is soft.     Tenderness: There is no abdominal tenderness. There is no guarding or rebound.  Musculoskeletal:        General: Normal range of motion.      Cervical back: Normal range of motion and neck supple. No spinous process tenderness or muscular tenderness.     Right lower leg: No tenderness. No edema.     Left lower leg: No tenderness. No edema.  Skin:    General: Skin is warm and dry.  Neurological:     Mental Status: She is alert.     GCS: GCS eye subscore is 4. GCS verbal subscore is 3. GCS motor subscore is 5.     Comments: Alert to self only, confused to time place and event.  Speech clear, no cranial nerve deficit.  Examination limited.  Moves bilateral extremities with equal strength     ED Results / Procedures / Treatments   Labs (all labs ordered are listed, but only abnormal results are displayed) Labs Reviewed  BASIC METABOLIC PANEL - Abnormal; Notable for the following components:      Result Value   Sodium 132 (*)    Potassium 5.5 (*)    Chloride 93 (*)    Glucose, Bld 102 (*)    BUN 45 (*)    Creatinine, Ser 2.20 (*)    GFR, Estimated 21 (*)    All other components within normal limits  CBC - Abnormal; Notable for the following components:   WBC 13.7 (*)    RBC 3.79 (*)    Hemoglobin 10.5 (*)    HCT 34.6 (*)    RDW 16.7 (*)    All other components within normal limits  URINALYSIS, ROUTINE W REFLEX MICROSCOPIC - Abnormal; Notable for the following components:   Color, Urine AMBER (*)    APPearance CLOUDY (*)    Protein, ur 30 (*)    Leukocytes,Ua TRACE (*)    Bacteria, UA RARE (*)    All other components within normal limits  HEPATIC FUNCTION PANEL - Abnormal; Notable for the following components:  Albumin 3.2 (*)    AST 85 (*)    ALT 50 (*)    Alkaline Phosphatase 131 (*)    All other components within normal limits  BRAIN NATRIURETIC PEPTIDE - Abnormal; Notable for the following components:   B Natriuretic Peptide 564.9 (*)    All other components within normal limits  I-STAT VENOUS BLOOD GAS, ED - Abnormal; Notable for the following components:   pH, Ven 7.249 (*)    pCO2, Ven 64.9 (*)    pO2,  Ven 86.0 (*)    Bicarbonate 28.4 (*)    Sodium 133 (*)    Potassium 5.2 (*)    HCT 32.0 (*)    Hemoglobin 10.9 (*)    All other components within normal limits  RESP PANEL BY RT-PCR (FLU A&B, COVID) ARPGX2  CULTURE, BLOOD (SINGLE)  URINE CULTURE  CULTURE, BLOOD (SINGLE)  LACTIC ACID, PLASMA  PROTIME-INR  APTT  LACTIC ACID, PLASMA  LEGIONELLA PNEUMOPHILA SEROGP 1 UR AG    EKG Date/Time:  Saturday July 06 2020 14:15:20 EST Ventricular Rate:  101 PR Interval:    QRS Duration: 74 QT Interval:  378 QTC Calculation: 490 R Axis:   -23 Text Interpretation: Poor data quality, interpretation may be adversely affected Atrial fibrillation with rapid ventricular response Low voltage QRS Septal infarct , age undetermined T wave abnormality, consider lateral ischemia Abnormal ECG Unclear rhythm with wavering baseline, no STEMI Confirmed by Lavenia Atlas [phone]) on 07/22/2020 5:11:10   Radiology DG Chest Portable 1 View  Result Date: 07/11/2020 CLINICAL DATA:  Shortness of breath over the last 5 days. EXAM: PORTABLE CHEST 1 VIEW COMPARISON:  05/10/2020 FINDINGS: Heart size upper limits of normal. Chronic aortic atherosclerotic calcification. Hazy pulmonary infiltrate, more extensive on the right than the left. This could be due to viral or bacterial disease. No effusion. No dense consolidation or collapse. No acute bone finding. Chronic degenerative changes of the shoulders with likely chronic rotator cuff tears. IMPRESSION: 1. Hazy pulmonary infiltrate, right worse than left. This could be due to viral or bacterial disease. No dense consolidation or collapse. 2. Aortic atherosclerosis. Electronically Signed   By: Nelson Chimes M.D.   On: 07/05/2020 14:44    Procedures .Critical Care Performed by: Deliah Boston, PA-C Authorized by: Deliah Boston, PA-C   Critical care provider statement:    Critical care time (minutes):  65   Critical care was necessary to treat or prevent imminent  or life-threatening deterioration of the following conditions:  Shock and respiratory failure   Critical care was time spent personally by me on the following activities:  Discussions with consultants, evaluation of patient's response to treatment, examination of patient, ordering and performing treatments and interventions, ordering and review of laboratory studies, ordering and review of radiographic studies, pulse oximetry, re-evaluation of patient's condition, obtaining history from patient or surrogate, review of old charts and development of treatment plan with patient or surrogate   (including critical care time)  Medications Ordered in ED Medications  lactated ringers infusion ( Intravenous New Bag/Given 07/07/2020 1617)  cefTRIAXone (ROCEPHIN) 2 g in sodium chloride 0.9 % 100 mL IVPB (0 g Intravenous Stopped 07/20/2020 1656)  azithromycin (ZITHROMAX) 500 mg in sodium chloride 0.9 % 250 mL IVPB (0 mg Intravenous Stopped 07/12/2020 1846)  norepinephrine (LEVOPHED) 4mg  in 250mL premix infusion (2 mcg/min Intravenous New Bag/Given 07/02/2020 1843)  ipratropium-albuterol (DUONEB) 0.5-2.5 (3) MG/3ML nebulizer solution 3 mL (3 mLs Nebulization Given 07/11/2020 2006)  lactated ringers bolus  500 mL (0 mLs Intravenous Stopped 07/13/2020 1700)  lactated ringers bolus 500 mL (0 mLs Intravenous Stopped 07/09/2020 1720)  methylPREDNISolone sodium succinate (SOLU-MEDROL) 125 mg/2 mL injection 80 mg (80 mg Intravenous Given 07/05/2020 1623)  lactated ringers bolus 1,000 mL (1,000 mLs Intravenous New Bag/Given 07/20/2020 1945)    ED Course  I have reviewed the triage vital signs and the nursing notes.  Pertinent labs & imaging results that were available during my care of the patient were reviewed by me and considered in my medical decision making (see chart for details).    MDM Rules/Calculators/A&P                         Additional history obtained from: 1. Nursing notes from this visit. 2. Nursing staff: I was able to speak  with Mechele Claude at [facility].  She reports patient has been complaining of difficulty breathing since Thursday, Stanton Kidney reports patient was diagnosed with a bacterial infection but is not sure what kind of infection it was.  She reports that patient had been prescribed azithromycin but has yet to start her medications.  She reports patient has not experienced any falls and has not had any vomiting or diarrhea.  She is unsure if patient is on a blood thinner.  She reports patient is normally somewhat conversive but does stay in the Alzheimer's unit and is frequently confused.  Mary RN reports that patient is vaccinated against Covid but unclear if patient has received booster shot.  Stanton Kidney RN is attempting to fax over patient's medical records. 3. Additional history obtained from patient's POA, son Rex.  Advises that patient normally ANO x3, minimally confused at worst.  Change occurred 3 days ago patient has been complaining of difficulty breathing, cough reports that she was diagnosed with pneumonia by has not been able to start her antibiotics prior to arrival.  Patient normally on 3 L supplemental oxygen at baseline.  Reports patient is currently full code but they are in discussion with PCP to see if they would change this if her dementia worsens. ------------------------- 87 year old female presented with shortness of breath for 3 days questionable pneumonia diagnosis by nursing home but has not started antibiotics.  Covid vaccinated unclear if boosted.  With low sats improved on 10 L nonrebreather in triage.  Tachycardic on arrival but has improved with rest and oxygen therapy, blood pressure stable on initial evaluation.  Basic labs, EKG chest x-ray ordered.  Unclear if patient experiencing Covid viral infection versus bacterial pneumonia, undifferentiated sepsis order set utilized but patient does not currently meet SIRS/sepsis criteria, will give fluids as well.  No known history of CHF by family or by  nursing facility and patient does not appear fluid overloaded we will give 500 mL fluid bolus.  Unable to review patient's past medical records or previous echocardiograms at this time. - CBC shows leukocytosis of 13.7, anemia of 10.5, no priors to compare.  BMP shows elevated creatinine of 2.2 and BUN 45, again no priors to compare, shows hypochloremia and hyponatremia as well with hyperkalemia of 5.5.  Will give IV fluids for concern of AKI.  Chest x-ray shows multifocal pneumonia unclear if viral versus bacterial.  I was informed by patient's family that patient had negative Covid test earlier this week due to leukocytosis not being consistent with COVID-19 viral infection and abnormal chest x-ray will initiate Antibiotics and give additional fluids.  Urinalysis shows questionable UTI no urinary symptoms but  limited history will send for culture, patient received Rocephin.  I checked again with nursing secretary still awaiting patient's medical records. - Patient reassessed, she is resting comfortably arousable to touch, no current complaints still appears confused.  Suspect patient's confusion today secondary to infectious process.  Patient's son is at bedside states understanding of severity of patient's illness she will need admission, he states patient is still currently full code. Patient's blood pressure dropped again into the 80s she was tachycardic around 105 bpm.  Patient started on Levophed for blood pressure support.   Patient was initially given 1 L of IV fluids and started on maintenance fluids, BNP resulted at 564.9.  Patient without peripheral edema but hesitant to give additional IV fluids at this time for concern of CHF exacerbation. Additionally patient's lactic acid resulted at 1.2 which is inconsistent given concern for bacterial pneumonia.  Consult placed to critical care service for admission.  I discussed the case with Whitney from critical care at 6:45 PM and patient was accepted for  admission.  VBG obtained shows mild acidosis of 7.249, bicarb 28.4 patient appears to be compensating at this time.  Remains on 10 L supplemental oxygen.  Patient seen and evaluated by attending physician Dr. Dina Rich and agrees with work-up and critical care admission at this time.   Danilyn A Adorno was evaluated in Emergency Department on 07/14/20 for the symptoms described in the history of present illness. She was evaluated in the context of the global COVID-19 pandemic, which necessitated consideration that the patient might be at risk for infection with the SARS-CoV-2 virus that causes COVID-19. Institutional protocols and algorithms that pertain to the evaluation of patients at risk for COVID-19 are in a state of rapid change based on information released by regulatory bodies including the CDC and federal and state organizations. These policies and algorithms were followed during the patient's care in the ED. ============================= Addendum 10:30 PM: Informed critical care evaluated the patient recommended medicine admission as symptoms improved following IV fluids.  Patient has been off of Levophed for the past 2 hours systolic pressure 245.  Patient reassessed, she is resting comfortably in bed no acute distress vital signs stable.  Consult with Dr. Roel Cluck and patient was accepted to hospitalist service.   Note: Portions of this report may have been transcribed using voice recognition software. Every effort was made to ensure accuracy; however, inadvertent computerized transcription errors may still be present. Final Clinical Impression(s) / ED Diagnoses Final diagnoses:  Multifocal pneumonia  Acute respiratory failure with hypoxia [facility])    Rx / DC Orders ED Discharge Orders    None         Gari Crown 07-14-2020 2108    Deliah Boston, PA-C 07-14-20 2232    Lorelle Gibbs, DO 2020-07-14 2308

## 2020-07-06 NOTE — H&P (Incomplete)
Hannah [name] [id number] DOB: 07/25/1931 DOA: 07/25/2020     PCP: Street, [name], MD   Outpatient Specialists: * NONE CARDS: * Dr. NEphrology: *  Dr. NEurology *   Dr. Pulmonary *  Dr.  Oncology * Dr. Fabienne Bruns* Dr.  Sadie Haber, LB) Urology Dr. *  Patient arrived to ER on 07/15/2020 at 1353 Referred by Attending Horton, Alvin Critchley, DO   Patient coming from: home Lives alone,   *** With family From facility  [facility] in [place]  Chief Complaint:   Chief Complaint  Patient presents with  . Shortness of Breath    HPI: Hannah [name] is a 87 y.o. female with medical history significant of DM2, COPD, GERD, HLD, HTN     Presented with   5 d hx of SOB, she was recently diagnosed with PNA but only started on antibiotics today. O2 saturation was 79 on RA Now needing up to 10 L     Infectious risk factors:  Reports*** fever, shortness of breath, dry cough, chest pain, Sore throat, URI symptoms, anosmia/change in taste, N/V/Diarrhea/abdominal pain,  Body aches, severe fatigue Reports known sick contacts, known COVID 19 exposure, inability to completely isolate   ***KNOWN COVID POSITIVE   Has ***NOt been vaccinated against COVID    Initial COVID TEST  NEGATIVE**** POSITIVE,  ***in house  PCR testing  Pending  Lab Results  Component Value Date   [place] NEGATIVE 07/07/2020     Regarding pertinent Chronic problems:   Hyperlipidemia -  on statins Lipitor, LOfibra Lipid Panel  No results found for: CHOL, TRIG, HDL, CHOLHDL, VLDL, LDLCALC, LDLDIRECT, LABVLDL   HTN on Diovan   DM 2 - No results found for: HGBA1C ****on insulin, PO meds only, diet controlled     COPD - not **followed by pulmonology *** not  on baseline oxygen  *L,    *** OSA -on nocturnal oxygen, *CPAP, *noncompliant with CPAP  *** Hx of CVA - *with/out residual deficits on Aspirin 81 mg, 325, Plavix  ***A. Fib -  - CHA2DS2 vas score **** CHA2DS2/VAS Stroke Risk Points      N/A >= 2 Points: High  Risk  1 - 1.99 Points: Medium Risk  0 Points: Low Risk    Last Change: N/A      This score determines the patient's risk of having a stroke if the  patient has atrial fibrillation.      This score is not applicable to this patient. Components are not  calculated.     current  on anticoagulation with ****Coumadin  ***Xarelto,* Eliquis,  *** Not on anticoagulation secondary to Risk of Falls, *** recurrent bleeding         -  Rate control:  Currently controlled with ***Toprolol,  *Metoprolol,* Diltiazem, *Coreg          - Rhythm control: *** amiodarone, *flecainide  ***CKD stage III*- baseline Cr **** CrCl cannot be calculated (Unknown ideal weight.).  Lab Results  Component Value Date   CREATININE 2.20 (H) 07/29/2020      *** Dementia - on Aricept** [place]  *** Chronic anemia - baseline hg Hemoglobin & Hematocrit  Recent Labs    07/04/2020 1455 07/27/2020 1855  HGB 10.5* 10.9*   While in ER: CXR showing hazy pulmonary infiltrates Right >Left    Hospitalist was called for admission for CAP admission with acute respiratory failure  The following Work up has been ordered so far:  Orders Placed This Encounter  Procedures  .  Resp Panel by RT-PCR (Flu A&B, Covid) Nasopharyngeal Swab  . Blood culture (routine single)  . Urine culture  . Culture, blood (single)  . DG Chest Portable 1 View  . Basic metabolic panel  . CBC  . Lactic acid, plasma  . Protime-INR  . APTT  . Urinalysis, Routine w reflex microscopic  . Hepatic function panel  . Brain natriuretic peptide  . Diet NPO time specified  . Cardiac monitoring  . If O2 Sat <94% administer O2 at 2 liters/minute via nasal cannula  . Document height and weight  . Assess and Document Glasgow Coma Scale  . Document vital signs within 1-hour of fluid bolus completion.  Notify provider of abnormal vital signs despite fluid resuscitation.  . Refer to Sidebar Report: Sepsis Bundle ED/IP  . Notify provider for difficulties  obtaining IV access  . Initiate Carrier Fluid Protocol  . DO NOT delay antibiotics if unable to obtain blood culture.  . Weigh patient  . In and Out Cath  . pharmacy consult  . pharmacy consult  . Consult for [place] Admission  ALL PATIENTS BEING ADMITTED/HAVING PROCEDURES NEED COVID-19 SCREENING  . Consult to intensivist  ALL PATIENTS BEING ADMITTED/HAVING PROCEDURES NEED COVID-19 SCREENING  . Airborne and Contact precautions  . Pulse oximetry, continuous  . I-Stat venous blood gas, [facility] ED)  . ED EKG  . EKG 12-Lead  . Saline lock IV  . Insert peripheral IV X 1  . Insert 2nd peripheral IV if not already present.    Following Medications were ordered in ER: Medications  lactated ringers infusion ( Intravenous New Bag/Given Jul 16, 2020 1617)  cefTRIAXone (ROCEPHIN) 2 g in sodium chloride 0.9 % 100 mL IVPB (0 g Intravenous Stopped 2020/07/16 1656)  azithromycin (ZITHROMAX) 500 mg in sodium chloride 0.9 % 250 mL IVPB (0 mg Intravenous Stopped July 16, 2020 1846)  norepinephrine (LEVOPHED) 4mg  in 28mL premix infusion (2 mcg/min Intravenous New Bag/Given 07/16/2020 1843)  lactated ringers bolus 500 mL (0 mLs Intravenous Stopped Jul 16, 2020 1700)  lactated ringers bolus 500 mL (0 mLs Intravenous Stopped Jul 16, 2020 1720)  methylPREDNISolone sodium succinate (SOLU-MEDROL) 125 mg/2 mL injection 80 mg (80 mg Intravenous Given 07/16/20 1623)         Significant initial  Findings: Abnormal Labs Reviewed  BASIC METABOLIC PANEL - Abnormal; Notable for the following components:      Result Value   Sodium 132 (*)    Potassium 5.5 (*)    Chloride 93 (*)    Glucose, Bld 102 (*)    BUN 45 (*)    Creatinine, Ser 2.20 (*)    GFR, Estimated 21 (*)    All other components within normal limits  CBC - Abnormal; Notable for the following components:   WBC 13.7 (*)    RBC 3.79 (*)    Hemoglobin 10.5 (*)    HCT 34.6 (*)    RDW 16.7 (*)    All other components within normal limits  URINALYSIS, ROUTINE W REFLEX  MICROSCOPIC - Abnormal; Notable for the following components:   Color, Urine AMBER (*)    APPearance CLOUDY (*)    Protein, ur 30 (*)    Leukocytes,Ua TRACE (*)    Bacteria, UA RARE (*)    All other components within normal limits  HEPATIC FUNCTION PANEL - Abnormal; Notable for the following components:   Albumin 3.2 (*)    AST 85 (*)    ALT 50 (*)    Alkaline Phosphatase 131 (*)  All other components within normal limits  BRAIN NATRIURETIC PEPTIDE - Abnormal; Notable for the following components:   B Natriuretic Peptide 564.9 (*)    All other components within normal limits  I-STAT VENOUS BLOOD GAS, ED - Abnormal; Notable for the following components:   pH, Ven 7.249 (*)    pCO2, Ven 64.9 (*)    pO2, Ven 86.0 (*)    Bicarbonate 28.4 (*)    Sodium 133 (*)    Potassium 5.2 (*)    HCT 32.0 (*)    Hemoglobin 10.9 (*)    All other components within normal limits      Otherwise labs showing:    Recent Labs  Lab 08/04/2020 1455 August 04, 2020 1855  NA 132* 133*  K 5.5* 5.2*  CO2 28  --   GLUCOSE 102*  --   BUN 45*  --   CREATININE 2.20*  --   CALCIUM 9.5  --     Cr  * stable,  Up from baseline see below Lab Results  Component Value Date   CREATININE 2.20 (H) 08/04/2020    Recent Labs  Lab August 04, 2020 1555  AST 85*  ALT 50*  ALKPHOS 131*  BILITOT 0.6  PROT 6.6  ALBUMIN 3.2*   Lab Results  Component Value Date   CALCIUM 9.5 2020/08/04          WBC      Component Value Date/Time   WBC 13.7 (H) 08/04/2020 1455        Plt: Lab Results  Component Value Date   PLT 250 2020/08/04     Lactic Acid, Venous    Component Value Date/Time   LATICACIDVEN 1.2 04-Aug-2020 1555   @ (RESUTFAST[lacticacid:4)@  Procalcitonin *** Ordered   COVID-19 Labs  No results for input(s): DDIMER, FERRITIN, LDH, CRP in the last 72 hours.  Lab Results  Component Value Date   [place] NEGATIVE 2020/08/04      Arterial ***Venous  Blood Gas result:  pH *** pCO2  ***; pO2 ***;     %O2 Sat ***.  ABG    Component Value Date/Time   HCO3 28.4 (H) 08-04-2020 1855   TCO2 30 08/04/2020 1855   O2SAT 94.0 August 04, 2020 1855      HG/HCT * stable,  Down *Up from baseline see below    Component Value Date/Time   HGB 10.9 (L) 2020/08/04 1855   HCT 32.0 (L) 08-04-2020 1855   MCV 91.3 08/04/20 1455    No results for input(s): LIPASE, AMYLASE in the last 168 hours. No results for input(s): AMMONIA in the last 168 hours.     Troponin ***ordered Cardiac Panel (last 3 results) No results for input(s): CKTOTAL, CKMB, TROPONINI, RELINDX in the last 72 hours.     ECG: Ordered Personally reviewed by me showing: HR : *** Rhythm: *NSR, Sinus tachycardia * A.fib. W RVR, RBBB, LBBB, Paced Ischemic changes*nonspecific changes, no evidence of ischemic changes QTC*   BNP (last 3 results) Recent Labs    2020/08/04 1540  BNP 564.9*      DM  labs:  HbA1C: No results for input(s): HGBA1C in the last 8760 hours.     CBG (last 3)  No results for input(s): GLUCAP in the last 72 hours.     UA *** no evidence of UTI  ***Pending ***not ordered   Urine analysis:    Component Value Date/Time   COLORURINE AMBER (A) 2020-08-04 1637   APPEARANCEUR CLOUDY (A) 04-Aug-2020 1637   LABSPEC 1.018  07/01/2020 1637   PHURINE 5.0 07/11/2020 1637   GLUCOSEU NEGATIVE 07/24/2020 1637   HGBUR NEGATIVE 07/04/2020 [place] 07/26/2020 [place] 07/08/2020 1637   PROTEINUR 30 (A) 07/11/2020 1637   NITRITE NEGATIVE 07/07/2020 1637   LEUKOCYTESUR TRACE (A) 07/12/2020 1637       Ordered  CT HEAD *** NON acute  CXR - ***NON acute  CTabd/pelvis - ***nonacute  CTA chest - ***nonacute, no PE, * no evidence of infiltrate      ED Triage Vitals  Enc Vitals Group     BP 07/10/2020 1402 104/60     Pulse Rate 07/08/2020 1402 (!) 120     Resp 07/22/2020 1402 18     Temp 07/04/2020 1402 98.7 F (37.1 C)     Temp Source 07/17/2020 1402 Oral      SpO2 07/10/2020 1402 100 %     Weight --      Height --      Head Circumference --      Peak Flow --      Pain Score 07/11/2020 1413 0     Pain Loc --      Pain Edu? --      Excl. in [place]? --   TMAX(24)@       Latest  Blood pressure (!) 83/58, pulse 88, temperature 98.7 F (37.1 C), temperature source Oral, resp. rate 14, SpO2 100 %.       Review of Systems:    Pertinent positives include: ***  Constitutional:  No weight loss, night sweats, Fevers, chills, fatigue, weight loss  HEENT:  No headaches, Difficulty swallowing,Tooth/dental problems,Sore throat,  No sneezing, itching, ear ache, nasal congestion, post nasal drip,  Cardio-vascular:  No chest pain, Orthopnea, PND, anasarca, dizziness, palpitations.no Bilateral lower extremity swelling  GI:  No heartburn, indigestion, abdominal pain, nausea, vomiting, diarrhea, change in bowel habits, loss of appetite, melena, blood in stool, hematemesis Resp:  no shortness of breath at rest. No dyspnea on exertion, No excess mucus, no productive cough, No non-productive cough, No coughing up of blood.No change in color of mucus.No wheezing. Skin:  no rash or lesions. No jaundice GU:  no dysuria, change in color of urine, no urgency or frequency. No straining to urinate.  No flank pain.  Musculoskeletal:  No joint pain or no joint swelling. No decreased range of motion. No back pain.  Psych:  No change in mood or affect. No depression or anxiety. No memory loss.  Neuro: no localizing neurological complaints, no tingling, no weakness, no double vision, no gait abnormality, no slurred speech, no confusion  All systems reviewed and apart from [place] all are negative  Past Medical History:   Past Medical History:  Diagnosis Date  . COPD (chronic obstructive pulmonary disease) ([place])   . DM2 (diabetes mellitus, type 2) ([place])   . Exertional dyspnea   . GERD (gastroesophageal reflux disease)   . HLD (hyperlipidemia)   . HTN  (hypertension)   . Pneumonia       Past Surgical History:  Procedure Laterality Date  . BRAIN SURGERY    . CHOLECYSTECTOMY    . L arm surgery    . TONSILLECTOMY    . TOTAL KNEE ARTHROPLASTY      Social History:  Ambulatory *** independently cane, walker  wheelchair bound, bed bound     reports that she quit smoking about 10 years ago. She has a 30.00 pack-year smoking history. She does not  have any smokeless tobacco history on file. She reports that she does not drink alcohol. No history on file for drug use.     Family History: *** Family History  Problem Relation Age of Onset  . Heart attack Other   . Heart disease Other     Allergies: Allergies  Allergen Reactions  . Codeine Nausea And Vomiting  . Sulfa Antibiotics Nausea And Vomiting     Prior to Admission medications   Medication Sig Start Date End Date Taking? Authorizing Provider  acetaminophen (TYLENOL) 325 MG tablet Take 650 mg by mouth every 8 (eight) hours as needed (pain).   Yes [provider]  acetaminophen (TYLENOL) 500 MG tablet Take 500 mg by mouth 2 (two) times daily.   Yes [provider]  albuterol (VENTOLIN HFA) 108 (90 Base) MCG/ACT inhaler Inhale 2 puffs into the lungs every 4 (four) hours as needed for wheezing or shortness of breath.   Yes [provider]  amitriptyline (ELAVIL) 25 MG tablet Take 25 mg by mouth at bedtime.   Yes [provider]  Ascorbic Acid (VITAMIN C) 1000 MG tablet Take 1,000 mg by mouth 2 (two) times daily.   Yes [provider]  aspirin EC 81 MG tablet Take 81 mg by mouth daily. Swallow whole.   Yes [provider]  azithromycin (ZITHROMAX) 250 MG tablet Take 250-500 mg by mouth See admin instructions. Take 2 tablets (500 mg) by mouth 1st day, then take 1 tablet (250 mg) daily on days 2-5   Yes [provider]  benzonatate (TESSALON) 100 MG capsule Take 100 mg by mouth every 8 (eight) hours as needed for  cough.   Yes [provider]  bethanechol (URECHOLINE) 10 MG tablet Take 10 mg by mouth 2 (two) times daily.   Yes [provider]  Calcium Carb-Cholecalciferol (CALCIUM 600+D) 600-800 MG-UNIT TABS Take 1 tablet by mouth 2 (two) times daily.   Yes [provider]  carvedilol (COREG) 3.125 MG tablet Take 3.125 mg by mouth 2 (two) times daily.   Yes [provider]  dicyclomine (BENTYL) 10 MG capsule Take 10 mg by mouth 2 (two) times daily.   Yes [provider]  Ensure (ENSURE) Take 237 mLs by mouth 3 (three) times daily.   Yes [provider]  ferrous sulfate 325 (65 FE) MG tablet Take 325 mg by mouth 2 (two) times daily.   Yes [provider]  fluticasone (FLONASE) 50 MCG/ACT nasal spray Place 1 spray into both nostrils daily.   Yes [provider]  furosemide (LASIX) 40 MG tablet Take 40 mg by mouth daily.   Yes [provider]  ipratropium (ATROVENT) 0.02 % nebulizer solution Take 0.5 mg by nebulization 3 (three) times daily.   Yes [provider]  levothyroxine (SYNTHROID) 25 MCG tablet Take 25 mcg by mouth daily before breakfast.   Yes [provider]  loratadine (CLARITIN) 10 MG tablet Take 10 mg by mouth daily.   Yes [provider]  LORazepam (ATIVAN) 0.5 MG tablet Take 0.5 mg by mouth every 6 (six) hours as needed for anxiety.   Yes [provider]  meclizine (ANTIVERT) 25 MG tablet Take 25 mg by mouth every 6 (six) hours as needed for dizziness.   Yes [provider]  metFORMIN (GLUCOPHAGE) 500 MG tablet Take 500 mg by mouth 2 (two) times daily.   Yes [provider]  mirabegron ER (MYRBETRIQ) 25 MG TB24 tablet Take 25  mg by mouth at bedtime.   Yes [provider]  montelukast (SINGULAIR) 10 MG tablet Take 10 mg by mouth at bedtime.   Yes [provider]  morphine (ROXANOL) 20 MG/ML concentrated solution Take 5 mg by mouth every 2 (two)  hours as needed for severe pain or shortness of breath.   Yes [provider]  omeprazole (PRILOSEC) 40 MG capsule Take 40 mg by mouth daily before breakfast.   Yes [provider]  ondansetron (ZOFRAN) 4 MG tablet Take 4 mg by mouth every 6 (six) hours as needed for nausea or vomiting.   Yes [provider]  OXYGEN Inhale 3 L into the lungs continuous.   Yes [provider]  PARoxetine (PAXIL) 20 MG tablet Take 20 mg by mouth daily.   Yes [provider]  Polyethyl Glycol-Propyl Glycol (SYSTANE) 0.4-0.3 % SOLN Place 1 drop into both eyes every 12 (twelve) hours as needed (dry eyes).   Yes [provider]  potassium chloride SA (KLOR-CON) 20 MEQ tablet Take 20 mEq by mouth daily as needed (with each dose of torsemide).   Yes [provider]  promethazine-dextromethorphan (PROMETHAZINE-DM) 6.25-15 MG/5ML syrup Take 5 mLs by mouth every 6 (six) hours as needed for cough.   Yes [provider]  senna-docusate (SENOKOT-S) 8.6-50 MG tablet Take 1 tablet by mouth every 12 (twelve) hours as needed (constipation).   Yes [provider]  tiZANidine (ZANAFLEX) 2 MG tablet Take 2 mg by mouth every 12 (twelve) hours as needed for muscle spasms.   Yes [provider]  torsemide (DEMADEX) 10 MG tablet Take 10 mg by mouth daily as needed (swelling).   Yes [provider]  traMADol (ULTRAM) 50 MG tablet Take 50 mg by mouth every 12 (twelve) hours as needed (chronic pain).   Yes [provider]   Physical Exam: Vitals with BMI 07/09/2020 [id number]  Systolic 83 83 93  Diastolic 58 65 56  Pulse 88 67 54     1. General:  in No ***Acute distress***increased work of breathing ***complaining of severe pain****agitated * Chronically ill *well *cachectic *toxic acutely ill -appearing 2. Psychological: Alert and *** Oriented 3. Head/ENT:   Moist *** Dry Mucous Membranes                          Head Non  traumatic, neck supple                          Normal *** Poor Dentition 4. SKIN: normal *** decreased Skin turgor,  Skin clean Dry and intact no rash 5. Heart: Regular rate and rhythm no*** Murmur, no Rub or gallop 6. Lungs: ***Clear to auscultation bilaterally, no wheezes or crackles   7. Abdomen: Soft, ***non-tender, Non distended *** obese ***bowel sounds present 8. Lower extremities: no clubbing, cyanosis, no ***edema 9. Neurologically Grossly intact, moving all 4 extremities equally *** strength 5 out of 5 in all 4 extremities cranial nerves II through XII intact 10. MSK: Normal range of motion   All other LABS:     Recent Labs  Lab 07/15/2020 1455 07/16/2020 1855  WBC 13.7*  --   HGB 10.5* 10.9*  HCT 34.6* 32.0*  MCV 91.3  --   PLT 250  --      Recent Labs  Lab 07/13/2020 1455 07/14/2020 1855  NA 132* 133*  K 5.5* 5.2*  CL 93*  --  CO2 28  --   GLUCOSE 102*  --   BUN 45*  --   CREATININE 2.20*  --   CALCIUM 9.5  --      Recent Labs  Lab 07/15/2020 1555  AST 85*  ALT 50*  ALKPHOS 131*  BILITOT 0.6  PROT 6.6  ALBUMIN 3.2*       Cultures: No results found for: SDES, SPECREQUEST, CULT, REPTSTATUS   Radiological Exams on Admission: DG Chest Portable 1 View  Result Date: 07/10/2020 CLINICAL DATA:  Shortness of breath over the last 5 days. EXAM: PORTABLE CHEST 1 VIEW COMPARISON:  05/10/2020 FINDINGS: Heart size upper limits of normal. Chronic aortic atherosclerotic calcification. Hazy pulmonary infiltrate, more extensive on the right than the left. This could be due to viral or bacterial disease. No effusion. No dense consolidation or collapse. No acute bone finding. Chronic degenerative changes of the shoulders with likely chronic rotator cuff tears. IMPRESSION: 1. Hazy pulmonary infiltrate, right worse than left. This could be due to viral or bacterial disease. No dense consolidation or collapse. 2. Aortic atherosclerosis. Electronically Signed   By: Nelson Chimes  M.D.   On: 07/08/2020 14:44    Chart has been reviewed    Assessment/Plan  ***  Admitted for ***  Present on Admission: **None**   Other plan as per orders.  DVT prophylaxis:  SCD *** Lovenox       Code Status:    Code Status: Not on file FULL CODE *** DNR/DNI ***comfort care as per patient ***family  I had personally discussed CODE STATUS with patient and family* I had spent *min discussing goals of care and CODE STATUS    Family Communication:   Family not at  Bedside  plan of care was discussed on the phone with *** Son, Daughter, Wife, Husband, Sister, Brother , father, mother  Disposition Plan:   *** likely will need placement for rehabilitation                          Back to current facility when stable                            To home once workup is complete and patient is stable  ***Following barriers for discharge:                            Electrolytes corrected                               Anemia corrected                             Pain controlled with PO medications                               Afebrile, white count improving able to transition to PO antibiotics                             Will need to be able to tolerate PO                            Will  likely need home health, home O2, set up                           Will need consultants to evaluate patient prior to discharge  ****EXPECT DC tomorrow                    ***Would benefit from PT/OT eval prior to DC  Ordered                   Swallow eval - SLP ordered                   Diabetes care coordinator                   Transition of care consulted                   Nutrition    consulted                  Wound care  consulted                   Palliative care    consulted                   Behavioral health  consulted                    Consults called: ***    Admission status:  ED Disposition    ED Disposition Condition Comment   Admit  The patient appears reasonably  stabilized for admission considering the current resources, flow, and capabilities available in the ED at this time, and I doubt any other [facility] requiring further screening and/or treatment in the ED prior to admission is  present.        Obs***  ***  inpatient     I Expect 2 midnight stay secondary to severity of patient's current illness need for inpatient interventions justified by the following: ***hemodynamic instability despite optimal treatment (tachycardia *hypotension * tachypnea *hypoxia, hypercapnia) * Severe lab/radiological/exam abnormalities including:     and extensive comorbidities including: *substance abuse  *Chronic pain *DM2  * CHF * CAD  * COPD/asthma *Morbid Obesity * CKD *dementia *liver disease *history of stroke with residual deficits *  malignancy, * sickle cell disease  . History of amputation . Chronic anticoagulation  That are currently affecting medical management.   I expect  patient to be hospitalized for 2 midnights requiring inpatient medical care.  Patient is at high risk for adverse outcome (such as loss of life or disability) if not treated.  Indication for inpatient stay as follows:  Severe change from baseline regarding mental status Hemodynamic instability despite maximal medical therapy,  ongoing suicidal ideations,  severe pain requiring acute inpatient management,  inability to maintain oral hydration   persistent chest pain despite medical management Need for operative/procedural  intervention New or worsening hypoxia  Need for IV antibiotics, IV fluids, IV rate controling medications, IV antihypertensives, IV pain medications, IV anticoagulation    Level of care   *** tele  For 12H 24H     medical floor       SDU tele indefinitely please discontinue once patient no longer qualifies COVID-19 Labs    Lab Results  Component Value Date   [place] NEGATIVE 07/05/2020     Precautions: admitted as *** Covid Negative   ***asymptomatic screening protocol****PUI ***  covid positive Airborne and Contact precautions ***If Covid PCR is negative  - please DC precautions - would need additional investigation given very high risk for false native test result   PPE: Used by the provider:   N95***P100  eye Goggles,  Gloves ***gown     Critical***  Patient is critically ill due to  hemodynamic instability * respiratory failure *severe sepsis* ongoing chest pain*  They are at high risk for life/limb threatening clinical deterioration requiring frequent reassessment and modifications of care.  Services provided include examination of the patient, review of relevant ancillary tests, prescription of lifesaving therapies, review of medications and prophylactic therapy.  Total critical care time excluding separately billable procedures: 60*  Minutes.    Kaelyn Nauta 07/28/2020, 7:21 PM ***  [facility]     after 2 AM please page floor coverage PA If 7AM-7PM, please contact the day team taking care of the patient using [email or web address]   Patient was evaluated in the context of the global COVID-19 pandemic, which necessitated consideration that the patient might be at risk for infection with the SARS-CoV-2 virus that causes COVID-19. Institutional protocols and algorithms that pertain to the evaluation of patients at risk for COVID-19 are in a state of rapid change based on information released by regulatory bodies including the CDC and federal and state organizations. These policies and algorithms were followed during the patient's care.

## 2020-07-06 NOTE — ED Notes (Signed)
Bladder scan volume 57

## 2020-07-06 NOTE — Progress Notes (Signed)
Pharmacy Antibiotic Note  Hannah [name] is a 87 y.o. female admitted on 07-Jul-2020 with sepsis.  Pharmacy has been consulted for vancomycin and cefepime dosing.  Plan: Vancomycin 1250 mg IV x 1 then dose per levels/renal function Cefepime 2gm IV x 1 then q24 hours F/u renal function, cultures and clinical course  Height: 5\' 4"  (162.6 cm) Weight: 59 kg (130 lb) IBW/kg (Calculated) : 54.7  Temp (24hrs), Avg:98.4 F (36.9 C), Min:98 F (36.7 C), Max:98.7 F (37.1 C)  Recent Labs  Lab Jul 07, 2020 1455 07/07/20 1555  WBC 13.7*  --   CREATININE 2.20*  --   LATICACIDVEN  --  1.2    Estimated Creatinine Clearance: 15.9 mL/min (A) (by C-G formula based on SCr of 2.2 mg/dL (H)).    Allergies  Allergen Reactions  . Codeine Nausea And Vomiting  . Sulfa Antibiotics Nausea And Vomiting   Thank you for allowing pharmacy to be a part of this patient's care.  Excell Seltzer [place] July 07, 2020 11:27 PM

## 2020-07-07 DIAGNOSIS — R1310 Dysphagia, unspecified: Secondary | ICD-10-CM | POA: Insufficient documentation

## 2020-07-07 DIAGNOSIS — J9601 Acute respiratory failure with hypoxia: Secondary | ICD-10-CM | POA: Insufficient documentation

## 2020-07-07 DIAGNOSIS — J189 Pneumonia, unspecified organism: Secondary | ICD-10-CM

## 2020-07-07 DIAGNOSIS — J449 Chronic obstructive pulmonary disease, unspecified: Secondary | ICD-10-CM | POA: Diagnosis present

## 2020-07-07 DIAGNOSIS — K219 Gastro-esophageal reflux disease without esophagitis: Secondary | ICD-10-CM | POA: Diagnosis present

## 2020-07-07 DIAGNOSIS — I4891 Unspecified atrial fibrillation: Secondary | ICD-10-CM | POA: Diagnosis not present

## 2020-07-07 DIAGNOSIS — E119 Type 2 diabetes mellitus without complications: Secondary | ICD-10-CM

## 2020-07-07 DIAGNOSIS — E1169 Type 2 diabetes mellitus with other specified complication: Secondary | ICD-10-CM | POA: Diagnosis present

## 2020-07-07 DIAGNOSIS — N183 Chronic kidney disease, stage 3 unspecified: Secondary | ICD-10-CM | POA: Diagnosis present

## 2020-07-07 DIAGNOSIS — J9621 Acute and chronic respiratory failure with hypoxia: Secondary | ICD-10-CM | POA: Diagnosis present

## 2020-07-07 DIAGNOSIS — I10 Essential (primary) hypertension: Secondary | ICD-10-CM | POA: Diagnosis present

## 2020-07-07 LAB — I-STAT VENOUS BLOOD GAS, ED
Acid-Base Excess: 1 mmol/L (ref 0.0–2.0)
Bicarbonate: 29.1 mmol/L — ABNORMAL HIGH (ref 20.0–28.0)
Calcium, Ion: 1.15 mmol/L (ref 1.15–1.40)
HCT: 32 % — ABNORMAL LOW (ref 36.0–46.0)
Hemoglobin: 10.9 g/dL — ABNORMAL LOW (ref 12.0–15.0)
O2 Saturation: 99 %
Potassium: 5.9 mmol/L — ABNORMAL HIGH (ref 3.5–5.1)
Sodium: 133 mmol/L — ABNORMAL LOW (ref 135–145)
TCO2: 31 mmol/L (ref 22–32)
pCO2, Ven: 67.5 mmHg — ABNORMAL HIGH (ref 44.0–60.0)
pH, Ven: 7.243 — ABNORMAL LOW (ref 7.250–7.430)
pO2, Ven: 147 mmHg — ABNORMAL HIGH (ref 32.0–45.0)

## 2020-07-07 LAB — PROCALCITONIN
Procalcitonin: 0.43 ng/mL
Procalcitonin: 0.37 ng/mL

## 2020-07-07 LAB — BASIC METABOLIC PANEL
Anion gap: 10 (ref 5–15)
BUN: 45 mg/dL — ABNORMAL HIGH (ref 8–23)
CO2: 28 mmol/L (ref 22–32)
Calcium: 8.8 mg/dL — ABNORMAL LOW (ref 8.9–10.3)
Chloride: 92 mmol/L — ABNORMAL LOW (ref 98–111)
Creatinine, Ser: 2.2 mg/dL — ABNORMAL HIGH (ref 0.44–1.00)
GFR, Estimated: 21 mL/min — ABNORMAL LOW (ref >60)
Glucose, Bld: 143 mg/dL — ABNORMAL HIGH (ref 70–99)
Potassium: 6.1 mmol/L — ABNORMAL HIGH (ref 3.5–5.1)
Sodium: 130 mmol/L — ABNORMAL LOW (ref 135–145)

## 2020-07-07 LAB — CBC WITH DIFFERENTIAL/PLATELET
Abs Immature Granulocytes: 0.07 10*3/uL (ref 0.00–0.07)
Basophils Absolute: 0 10*3/uL (ref 0.0–0.1)
Basophils Relative: 0 %
Eosinophils Absolute: 0 10*3/uL (ref 0.0–0.5)
Eosinophils Relative: 0 %
HCT: 34.3 % — ABNORMAL LOW (ref 36.0–46.0)
Hemoglobin: 10 g/dL — ABNORMAL LOW (ref 12.0–15.0)
Immature Granulocytes: 1 %
Lymphocytes Relative: 11 %
Lymphs Abs: 1.5 10*3/uL (ref 0.7–4.0)
MCH: 27.5 pg (ref 26.0–34.0)
MCHC: 29.2 g/dL — ABNORMAL LOW (ref 30.0–36.0)
MCV: 94.5 fL (ref 80.0–100.0)
Monocytes Absolute: 0.4 10*3/uL (ref 0.1–1.0)
Monocytes Relative: 3 %
Neutro Abs: 11.4 10*3/uL — ABNORMAL HIGH (ref 1.7–7.7)
Neutrophils Relative %: 85 %
Platelets: 221 10*3/uL (ref 150–400)
RBC: 3.63 MIL/uL — ABNORMAL LOW (ref 3.87–5.11)
RDW: 16.5 % — ABNORMAL HIGH (ref 11.5–15.5)
WBC: 13.4 10*3/uL — ABNORMAL HIGH (ref 4.0–10.5)
nRBC: 0 % (ref 0.0–0.2)

## 2020-07-07 LAB — IRON AND TIBC
Iron: 11 ug/dL — ABNORMAL LOW (ref 28–170)
Saturation Ratios: 4 % — ABNORMAL LOW (ref 10.4–31.8)
TIBC: 273 ug/dL (ref 250–450)
UIBC: 262 ug/dL

## 2020-07-07 LAB — TSH: TSH: 1.888 u[IU]/mL (ref 0.350–4.500)

## 2020-07-07 LAB — LACTIC ACID, PLASMA
Lactic Acid, Venous: 1.2 mmol/L (ref 0.5–1.9)
Lactic Acid, Venous: 1.3 mmol/L (ref 0.5–1.9)

## 2020-07-07 LAB — COMPREHENSIVE METABOLIC PANEL
ALT: 61 U/L — ABNORMAL HIGH (ref 0–44)
AST: 86 U/L — ABNORMAL HIGH (ref 15–41)
Albumin: 2.8 g/dL — ABNORMAL LOW (ref 3.5–5.0)
Alkaline Phosphatase: 150 U/L — ABNORMAL HIGH (ref 38–126)
Anion gap: 12 (ref 5–15)
BUN: 49 mg/dL — ABNORMAL HIGH (ref 8–23)
CO2: 23 mmol/L (ref 22–32)
Calcium: 8.7 mg/dL — ABNORMAL LOW (ref 8.9–10.3)
Chloride: 97 mmol/L — ABNORMAL LOW (ref 98–111)
Creatinine, Ser: 2.18 mg/dL — ABNORMAL HIGH (ref 0.44–1.00)
GFR, Estimated: 22 mL/min — ABNORMAL LOW (ref >60)
Glucose, Bld: 127 mg/dL — ABNORMAL HIGH (ref 70–99)
Potassium: 6.3 mmol/L (ref 3.5–5.1)
Sodium: 132 mmol/L — ABNORMAL LOW (ref 135–145)
Total Bilirubin: 0.8 mg/dL (ref 0.3–1.2)
Total Protein: 6.2 g/dL — ABNORMAL LOW (ref 6.5–8.1)

## 2020-07-07 LAB — HEPARIN LEVEL (UNFRACTIONATED): Heparin Unfractionated: 0.39 IU/mL (ref 0.30–0.70)

## 2020-07-07 LAB — CBG MONITORING, ED
Glucose-Capillary: 120 mg/dL — ABNORMAL HIGH (ref 70–99)
Glucose-Capillary: 137 mg/dL — ABNORMAL HIGH (ref 70–99)
Glucose-Capillary: 141 mg/dL — ABNORMAL HIGH (ref 70–99)
Glucose-Capillary: 140 mg/dL — ABNORMAL HIGH (ref 70–99)

## 2020-07-07 LAB — MAGNESIUM: Magnesium: 2.1 mg/dL (ref 1.7–2.4)

## 2020-07-07 LAB — HEMOGLOBIN A1C
Hgb A1c MFr Bld: 5.3 % (ref 4.8–5.6)
Mean Plasma Glucose: 105.41 mg/dL

## 2020-07-07 LAB — RETICULOCYTES
Immature Retic Fract: 20.2 % — ABNORMAL HIGH (ref 2.3–15.9)
RBC.: 3.68 MIL/uL — ABNORMAL LOW (ref 3.87–5.11)
Retic Count, Absolute: 71.8 10*3/uL (ref 19.0–186.0)
Retic Ct Pct: 2 % (ref 0.4–3.1)

## 2020-07-07 LAB — PHOSPHORUS: Phosphorus: 5.9 mg/dL — ABNORMAL HIGH (ref 2.5–4.6)

## 2020-07-07 LAB — FERRITIN: Ferritin: 129 ng/mL (ref 11–307)

## 2020-07-07 LAB — CK: Total CK: 17 U/L — ABNORMAL LOW (ref 38–234)

## 2020-07-07 LAB — VITAMIN B12: Vitamin B-12: 407 pg/mL (ref 180–914)

## 2020-07-07 LAB — FOLATE: Folate: 43.8 ng/mL (ref >5.9)

## 2020-07-07 MED ORDER — ACETAMINOPHEN 650 MG RE SUPP: 650.0000 mg | Freq: Four times a day (QID) | RECTAL | Status: DC | PRN

## 2020-07-07 MED ORDER — HEPARIN (PORCINE) 25000 UT/250ML-% IV SOLN
900.0000 [IU]/h | INTRAVENOUS | Status: DC
  Administered 2020-07-07 – 2020-07-11 (×5): 900 [IU]/h via INTRAVENOUS
  Filled 2020-07-07 (×5): qty 250

## 2020-07-07 MED ORDER — HYDROCODONE-ACETAMINOPHEN 5-325 MG PO TABS: 1.0000 | ORAL_TABLET | ORAL | Status: DC | PRN

## 2020-07-07 MED ORDER — SODIUM POLYSTYRENE SULFONATE 15 GM/60ML PO SUSP
30.0000 g | Freq: Once | ORAL | Status: AC
  Administered 2020-07-07: 30 g via RECTAL
  Filled 2020-07-07: qty 120

## 2020-07-07 MED ORDER — AMITRIPTYLINE HCL 25 MG PO TABS
25.0000 mg | ORAL_TABLET | Freq: Every day | ORAL | Status: DC
  Administered 2020-07-07: 25 mg via ORAL
  Filled 2020-07-07 (×2): qty 1

## 2020-07-07 MED ORDER — METHYLPREDNISOLONE SODIUM SUCC 40 MG IJ SOLR
40.0000 mg | Freq: Two times a day (BID) | INTRAMUSCULAR | Status: DC
  Administered 2020-07-07 – 2020-07-09 (×5): 40 mg via INTRAVENOUS
  Filled 2020-07-07 (×5): qty 1

## 2020-07-07 MED ORDER — ACETAMINOPHEN 325 MG PO TABS
650.0000 mg | ORAL_TABLET | Freq: Four times a day (QID) | ORAL | Status: DC | PRN
  Administered 2020-07-10 – 2020-07-11 (×2): 650 mg via ORAL
  Filled 2020-07-07 (×2): qty 2

## 2020-07-07 MED ORDER — SODIUM CHLORIDE 0.9 % IV SOLN: INTRAVENOUS | Status: DC

## 2020-07-07 MED ORDER — HEPARIN BOLUS VIA INFUSION
2000.0000 [IU] | Freq: Once | INTRAVENOUS | Status: AC
  Administered 2020-07-07: 2000 [IU] via INTRAVENOUS
  Filled 2020-07-07: qty 2000

## 2020-07-07 MED ORDER — IPRATROPIUM BROMIDE 0.02 % IN SOLN
0.5000 mg | Freq: Four times a day (QID) | RESPIRATORY_TRACT | Status: DC
  Administered 2020-07-07 – 2020-07-12 (×19): 0.5 mg via RESPIRATORY_TRACT
  Filled 2020-07-07 (×20): qty 2.5

## 2020-07-07 MED ORDER — METOPROLOL TARTRATE 25 MG PO TABS
12.5000 mg | ORAL_TABLET | Freq: Two times a day (BID) | ORAL | Status: DC
Start: 2020-07-07 — End: 2020-07-07

## 2020-07-07 MED ORDER — PANTOPRAZOLE SODIUM 40 MG IV SOLR
40.0000 mg | Freq: Every day | INTRAVENOUS | Status: DC
  Administered 2020-07-07: 40 mg via INTRAVENOUS
  Filled 2020-07-07: qty 40

## 2020-07-07 MED ORDER — CARVEDILOL 3.125 MG PO TABS
3.1250 mg | ORAL_TABLET | Freq: Two times a day (BID) | ORAL | Status: DC
  Administered 2020-07-07 – 2020-07-08 (×3): 3.125 mg via ORAL
  Filled 2020-07-07 (×4): qty 1

## 2020-07-07 MED ORDER — PANTOPRAZOLE SODIUM 40 MG IV SOLR
40.0000 mg | Freq: Two times a day (BID) | INTRAVENOUS | Status: DC
  Administered 2020-07-07 – 2020-07-13 (×12): 40 mg via INTRAVENOUS
  Filled 2020-07-07 (×12): qty 40

## 2020-07-07 MED ORDER — ALBUTEROL SULFATE (2.5 MG/3ML) 0.083% IN NEBU
2.5000 mg | INHALATION_SOLUTION | RESPIRATORY_TRACT | Status: DC | PRN
  Administered 2020-07-08 – 2020-07-15 (×5): 2.5 mg via RESPIRATORY_TRACT
  Filled 2020-07-07 (×6): qty 3

## 2020-07-07 MED ORDER — SODIUM ZIRCONIUM CYCLOSILICATE 10 G PO PACK
10.0000 g | PACK | Freq: Once | ORAL | Status: DC
  Filled 2020-07-07: qty 1

## 2020-07-07 MED ORDER — LEVOTHYROXINE SODIUM 25 MCG PO TABS
25.0000 ug | ORAL_TABLET | Freq: Every day | ORAL | Status: DC
  Administered 2020-07-07 – 2020-07-08 (×2): 25 ug via ORAL
  Filled 2020-07-07 (×2): qty 1

## 2020-07-07 NOTE — Progress Notes (Signed)
[facility] for heparin Indication: atrial fibrillation  Allergies  Allergen Reactions  . Codeine Nausea And Vomiting  . Sulfa Antibiotics Nausea And Vomiting    Patient Measurements: Height: 5\' 4"  (162.6 cm) Weight: 59 kg (130 lb) IBW/kg (Calculated) : 54.7  Vital Signs: Temp: 98.3 F (36.8 C) (01/09 0818) Temp Source: Oral (01/09 0818) BP: 118/74 (01/09 1130) Pulse Rate: 140 (01/09 1130)  Labs: Recent Labs    07/07/2020 1455 07/25/2020 1555 07/04/2020 1855 07/07/20 0004 07/07/20 0300 07/07/20 0510 07/07/20 1129  HGB 10.5*  --  10.9*  --  10.9* 10.0*  --   HCT 34.6*  --  32.0*  --  32.0* 34.3*  --   PLT 250  --   --   --   --  221  --   APTT  --  30  --   --   --   --   --   LABPROT  --  14.2  --   --   --   --   --   INR  --  1.1  --   --   --   --   --   HEPARINUNFRC  --   --   --   --   --   --  0.39  CREATININE 2.20*  --   --  2.20*  --  2.18*  --   CKTOTAL  --   --   --  17*  --   --   --     Estimated Creatinine Clearance: 16 mL/min (A) (by C-G formula based on SCr of 2.18 mg/dL (H)).   Medical History: Past Medical History:  Diagnosis Date  . COPD (chronic obstructive pulmonary disease) ([place])   . DM2 (diabetes mellitus, type 2) ([place])   . Exertional dyspnea   . GERD (gastroesophageal reflux disease)   . HLD (hyperlipidemia)   . HTN (hypertension)   . Pneumonia     Assessment: 87yo female admitted w/ sepsis and found to be in Afib w/ RVR, to begin heparin.  Heparin level therapeutic on 900 units/hr, CBC stable  Goal of Therapy:  Heparin level 0.3-0.7 units/ml Monitor platelets by anticoagulation protocol: Yes   Plan:  Continue heparin gtt at 900 units/hr Daily heparin level, CBC, s/s bleeding F/u long term [facility] plan  Bertis Ruddy, PharmD Clinical Pharmacist ED Pharmacist Phone # [phone] 07/07/2020 1:28 PM

## 2020-07-07 NOTE — H&P (Signed)
Hannah [name] YKZ:[id number] DOB: Apr 26, 1932 DOA: 07/13/2020     PCP: Emmaline Kluver, MD   Outpatient Specialists:     NEphrology:  Dr. Venetia Maxon    Patient arrived to ER on 07/05/2020 at 1353 Referred by Attending Horton, Alvin Critchley, DO   Patient coming from:   From facility  [facility] in [place], memory care  Chief Complaint:   Chief Complaint  Patient presents with  . Shortness of Breath    HPI: Hannah [name] is a 87 y.o. female with medical history significant of DM2, COPD on 3L at baseline, GERD, HLD, HTN, dementia    Presented with   5 d hx of SOB, cough and a fever she was recently diagnosed with PNA but only started on antibiotics today. O2 saturation was 79 on RA Now needing up to 10 L   at baseline wheelchair bound, knows her family her dementia has been minimal    fever, shortness of breath   Has   been vaccinated against COVID and boosted   Initial COVID TEST  NEGATIVE   Lab Results  Component Value Date   [place] NEGATIVE 07/10/2020    Regarding pertinent Chronic problems:   Hyperlipidemia -  on statins Lipitor, LOfibra Lipid Panel  No results found for: CHOL, TRIG, HDL, CHOLHDL, VLDL, LDLCALC, LDLDIRECT, LABVLDL   HTN on Diovan   DM 2 -   diet controlled     COPD - not  followed by pulmonology    on baseline oxygen  3L,      CKD stage IIIb-   CrCl cannot be calculated (Unknown ideal weight.).  Lab Results  Component Value Date   CREATININE 2.20 (H) 07/09/2020     Dementia -    Chronic anemia - baseline hg Hemoglobin & Hematocrit on iron supliment Recent Labs    07/22/2020 1455 07/17/2020 1855  HGB 10.5* 10.9*   While in ER: CXR showing hazy pulmonary infiltrates Right >Left initially hypotensive needing levophed now off    Hospitalist was called for admission for CAP admission with acute respiratory failure  The following Work up has been ordered so far:  Orders Placed This Encounter  Procedures  . Resp Panel by RT-PCR  (Flu A&B, Covid) Nasopharyngeal Swab  . Blood culture (routine single)  . Urine culture  . Culture, blood (single)  . DG Chest Portable 1 View  . Basic metabolic panel  . CBC  . Lactic acid, plasma  . Protime-INR  . APTT  . Urinalysis, Routine w reflex microscopic  . Hepatic function panel  . Brain natriuretic peptide  . Diet NPO time specified  . Cardiac monitoring  . If O2 Sat <94% administer O2 at 2 liters/minute via nasal cannula  . Document height and weight  . Assess and Document Glasgow Coma Scale  . Document vital signs within 1-hour of fluid bolus completion.  Notify provider of abnormal vital signs despite fluid resuscitation.  . Refer to Sidebar Report: Sepsis Bundle ED/IP  . Notify provider for difficulties obtaining IV access  . Initiate Carrier Fluid Protocol  . DO NOT delay antibiotics if unable to obtain blood culture.  . Weigh patient  . In and Out Cath  . pharmacy consult  . pharmacy consult  . Consult for [place] Admission  ALL PATIENTS BEING ADMITTED/HAVING PROCEDURES NEED COVID-19 SCREENING  . Consult to intensivist  ALL PATIENTS BEING ADMITTED/HAVING PROCEDURES NEED COVID-19 SCREENING  . Airborne and Contact precautions  . Pulse oximetry,  continuous  . I-Stat venous blood gas, [facility])  . ED EKG  . EKG 12-Lead  . Saline lock IV  . Insert peripheral IV X 1  . Insert 2nd peripheral IV if not already present.    Following Medications were ordered in ER: Medications  lactated ringers infusion ( Intravenous New Bag/Given 07/10/2020 1617)  cefTRIAXone (ROCEPHIN) 2 g in sodium chloride 0.9 % 100 mL IVPB (0 g Intravenous Stopped 07/15/2020 1656)  azithromycin (ZITHROMAX) 500 mg in sodium chloride 0.9 % 250 mL IVPB (0 mg Intravenous Stopped 07/23/2020 1846)  norepinephrine (LEVOPHED) 42m in 2553mpremix infusion (2 mcg/min Intravenous New Bag/Given 07/22/2020 1843)  lactated ringers bolus 500 mL (0 mLs Intravenous Stopped 07/11/2020 1700)  lactated ringers bolus 500 mL  (0 mLs Intravenous Stopped 07/24/2020 1720)  methylPREDNISolone sodium succinate (SOLU-MEDROL) 125 mg/2 mL injection 80 mg (80 mg Intravenous Given 07/13/2020 1623)   Significant initial  Findings: Abnormal Labs Reviewed  BASIC METABOLIC PANEL - Abnormal; Notable for the following components:      Result Value   Sodium 132 (*)    Potassium 5.5 (*)    Chloride 93 (*)    Glucose, Bld 102 (*)    BUN 45 (*)    Creatinine, Ser 2.20 (*)    GFR, Estimated 21 (*)    All other components within normal limits  CBC - Abnormal; Notable for the following components:   WBC 13.7 (*)    RBC 3.79 (*)    Hemoglobin 10.5 (*)    HCT 34.6 (*)    RDW 16.7 (*)    All other components within normal limits  URINALYSIS, ROUTINE W REFLEX MICROSCOPIC - Abnormal; Notable for the following components:   Color, Urine AMBER (*)    APPearance CLOUDY (*)    Protein, ur 30 (*)    Leukocytes,Ua TRACE (*)    Bacteria, UA RARE (*)    All other components within normal limits  HEPATIC FUNCTION PANEL - Abnormal; Notable for the following components:   Albumin 3.2 (*)    AST 85 (*)    ALT 50 (*)    Alkaline Phosphatase 131 (*)    All other components within normal limits  BRAIN NATRIURETIC PEPTIDE - Abnormal; Notable for the following components:   B Natriuretic Peptide 564.9 (*)    All other components within normal limits  CK - Abnormal; Notable for the following components:   Total CK 17 (*)    All other components within normal limits  BASIC METABOLIC PANEL - Abnormal; Notable for the following components:   Sodium 130 (*)    Potassium 6.1 (*)    Chloride 92 (*)    Glucose, Bld 143 (*)    BUN 45 (*)    Creatinine, Ser 2.20 (*)    Calcium 8.8 (*)    GFR, Estimated 21 (*)    All other components within normal limits  I-STAT VENOUS BLOOD GAS, ED - Abnormal; Notable for the following components:   pH, Ven 7.249 (*)    pCO2, Ven 64.9 (*)    pO2, Ven 86.0 (*)    Bicarbonate 28.4 (*)    Sodium 133 (*)     Potassium 5.2 (*)    HCT 32.0 (*)    Hemoglobin 10.9 (*)    All other components within normal limits  CBG MONITORING, ED - Abnormal; Notable for the following components:   Glucose-Capillary 130 (*)    All other components within normal limits  Otherwise labs showing:  Recent Labs  Lab 07/23/2020 1455 07/28/2020 1855 07/07/20 0004  NA 132* 133* 130*  K 5.5* 5.2* 6.1*  CO2 28  --  28  GLUCOSE 102*  --  143*  BUN 45*  --  45*  CREATININE 2.20*  --  2.20*  CALCIUM 9.5  --  8.8*    Cr    Lab Results  Component Value Date   CREATININE 2.20 (H) 07/12/2020    Recent Labs  Lab 07/28/2020 1555  AST 85*  ALT 50*  ALKPHOS 131*  BILITOT 0.6  PROT 6.6  ALBUMIN 3.2*   Lab Results  Component Value Date   CALCIUM 9.5 07/09/2020      WBC      Component Value Date/Time   WBC 13.7 (H) 06/30/2020 1455    Plt: Lab Results  Component Value Date   PLT 250 07/22/2020     Lactic Acid, Venous    Component Value Date/Time   LATICACIDVEN 1.2 07/09/2020 1555     repeat 1.3    Pro calcitonin  Ordered       Venous  Blood Gas result:  pH 7.49  PCO2 64.9    ABG    Component Value Date/Time   HCO3 28.4 (H) 07/28/2020 1855   TCO2 30 07/05/2020 1855   O2SAT 94.0 07/15/2020 1855      HG/HCT   Stable,     Component Value Date/Time   HGB 10.9 (L) 07/03/2020 1855   HCT 32.0 (L) 07/16/2020 1855   MCV 91.3 07/28/2020 1455    Cardiac Panel (last 3 results) Recent Labs    07/07/20 0004  CKTOTAL 17*       ECG: Ordered Personally reviewed by me showing: HR : 101 Rhythm:   A.fib. W RVR, vs Sinus with frequent PAC   no evidence of ischemic changes QTC 490   BNP (last 3 results) Recent Labs    07/02/2020 1540  BNP 564.9*    DM  labs:  HbA1C: Recent Labs    07/07/20 0004  HGBA1C 5.3       CBG (last 3)  Recent Labs    07/09/2020 2346  GLUCAP 130*       UA   no evidence of UTI      Urine analysis:    Component Value Date/Time   COLORURINE AMBER (A)  06/30/2020 1637   APPEARANCEUR CLOUDY (A) 07/17/2020 1637   LABSPEC 1.018 07/11/2020 1637   PHURINE 5.0 07/24/2020 [place] 07/21/2020 1637   [place] 07/17/2020 [place] 07/04/2020 [place] 07/09/2020 1637   PROTEINUR 30 (A) 07/02/2020 1637   NITRITE NEGATIVE 07/28/2020 1637   LEUKOCYTESUR TRACE (A) 07/21/2020 1637      Ordered   CXR - Hazy pulmonary infiltrate, right worse than left. This could be due to viral or bacterial disease. No dense consolidation or collapse.    ED Triage Vitals  Enc Vitals Group     BP 07/10/2020 1402 104/60     Pulse Rate 07/07/2020 1402 (!) 120     Resp 07/26/2020 1402 18     Temp 07/24/2020 1402 98.7 F (37.1 C)     Temp Source 07/20/2020 1402 Oral     SpO2 07/20/2020 1402 100 %     Weight --      Height --      Head Circumference --      Peak Flow --  Pain Score 07/23/2020 1413 0     Pain Loc --      Pain Edu? --      Excl. in [place]? --   TMAX(24)@       Latest  Blood pressure (!) 83/58, pulse 88, temperature 98.7 F (37.1 C), temperature source Oral, resp. rate 14, SpO2 100 %.    Review of Systems:    Pertinent positives include:  Fevers, chills, fatigue,  non-productive cough, shortness of breath at rest.  dyspnea on exertion Constitutional:  No weight loss, night sweats, weight loss  HEENT:  No headaches, Difficulty swallowing,Tooth/dental problems,Sore throat,  No sneezing, itching, ear ache, nasal congestion, post nasal drip,  Cardio-vascular:  No chest pain, Orthopnea, PND, anasarca, dizziness, palpitations.no Bilateral lower extremity swelling  GI:  No heartburn, indigestion, abdominal pain, nausea, vomiting, diarrhea, change in bowel habits, loss of appetite, melena, blood in stool, hematemesis Resp:  no , No excess mucus, no productive cough, No No coughing up of blood.No change in color of mucus.No wheezing. Skin:  no rash or lesions. No jaundice GU:  no dysuria, change in  color of urine, no urgency or frequency. No straining to urinate.  No flank pain.  Musculoskeletal:  No joint pain or no joint swelling. No decreased range of motion. No back pain.  Psych:  No change in mood or affect. No depression or anxiety. No memory loss.  Neuro: no localizing neurological complaints, no tingling, no weakness, no double vision, no gait abnormality, no slurred speech, no confusion  All systems reviewed and apart from [place] all are negative  Past Medical History:   Past Medical History:  Diagnosis Date  . COPD (chronic obstructive pulmonary disease) ([place])   . DM2 (diabetes mellitus, type 2) ([place])   . Exertional dyspnea   . GERD (gastroesophageal reflux disease)   . HLD (hyperlipidemia)   . HTN (hypertension)   . Pneumonia      Past Surgical History:  Procedure Laterality Date  . BRAIN SURGERY    . CHOLECYSTECTOMY    . L arm surgery    . TONSILLECTOMY    . TOTAL KNEE ARTHROPLASTY      Social History:  Ambulatory   wheelchair bound,      reports that she quit smoking about 10 years ago. She has a 30.00 pack-year smoking history. She does not have any smokeless tobacco history on file. She reports that she does not drink alcohol. No history on file for drug use.   Family History:   Family History  Problem Relation Age of Onset  . Heart attack Other   . Heart disease Other     Allergies: Allergies  Allergen Reactions  . Codeine Nausea And Vomiting  . Sulfa Antibiotics Nausea And Vomiting     Prior to Admission medications   Medication Sig Start Date End Date Taking? Authorizing Provider  acetaminophen (TYLENOL) 325 MG tablet Take 650 mg by mouth every 8 (eight) hours as needed (pain).   Yes [provider]  acetaminophen (TYLENOL) 500 MG tablet Take 500 mg by mouth 2 (two) times daily.   Yes [provider]  albuterol (VENTOLIN HFA) 108 (90 Base) MCG/ACT inhaler Inhale 2 puffs into the lungs every 4 (four) hours as needed for  wheezing or shortness of breath.   Yes [provider]  amitriptyline (ELAVIL) 25 MG tablet Take 25 mg by mouth at bedtime.   Yes [provider]  Ascorbic Acid (VITAMIN C) 1000 MG tablet Take  1,000 mg by mouth 2 (two) times daily.   Yes [provider]  aspirin EC 81 MG tablet Take 81 mg by mouth daily. Swallow whole.   Yes [provider]  azithromycin (ZITHROMAX) 250 MG tablet Take 250-500 mg by mouth See admin instructions. Take 2 tablets (500 mg) by mouth 1st day, then take 1 tablet (250 mg) daily on days 2-5   Yes [provider]  benzonatate (TESSALON) 100 MG capsule Take 100 mg by mouth every 8 (eight) hours as needed for cough.   Yes [provider]  bethanechol (URECHOLINE) 10 MG tablet Take 10 mg by mouth 2 (two) times daily.   Yes [provider]  Calcium Carb-Cholecalciferol (CALCIUM 600+D) 600-800 MG-UNIT TABS Take 1 tablet by mouth 2 (two) times daily.   Yes [provider]  carvedilol (COREG) 3.125 MG tablet Take 3.125 mg by mouth 2 (two) times daily.   Yes [provider]  dicyclomine (BENTYL) 10 MG capsule Take 10 mg by mouth 2 (two) times daily.   Yes [provider]  Ensure (ENSURE) Take 237 mLs by mouth 3 (three) times daily.   Yes [provider]  ferrous sulfate 325 (65 FE) MG tablet Take 325 mg by mouth 2 (two) times daily.   Yes [provider]  fluticasone (FLONASE) 50 MCG/ACT nasal spray Place 1 spray into both nostrils daily.   Yes [provider]  furosemide (LASIX) 40 MG tablet Take 40 mg by mouth daily.   Yes [provider]  ipratropium (ATROVENT) 0.02 % nebulizer solution Take 0.5 mg by nebulization 3 (three) times daily.   Yes [provider]  levothyroxine (SYNTHROID) 25 MCG tablet Take 25 mcg by mouth daily before breakfast.   Yes [provider]  loratadine (CLARITIN) 10 MG tablet Take 10 mg by mouth daily.   Yes [provider]  LORazepam (ATIVAN) 0.5 MG tablet Take 0.5 mg by mouth every 6 (six) hours as needed for anxiety.   Yes [provider]  meclizine (ANTIVERT) 25 MG tablet Take 25 mg by mouth every 6 (six) hours as needed for dizziness.   Yes [provider]  metFORMIN (GLUCOPHAGE) 500 MG tablet Take 500 mg by mouth 2 (two) times daily.   Yes [provider]  mirabegron ER (MYRBETRIQ) 25 MG TB24 tablet Take 25 mg by mouth at bedtime.   Yes [provider]  montelukast (SINGULAIR) 10 MG tablet Take 10 mg by mouth at bedtime.   Yes [provider]  morphine (ROXANOL) 20 MG/ML concentrated solution Take 5 mg by mouth every 2 (two) hours as needed for severe pain or shortness of breath.   Yes [provider]  omeprazole (PRILOSEC) 40 MG capsule Take 40 mg by mouth daily before breakfast.   Yes [provider]  ondansetron (ZOFRAN) 4 MG tablet Take 4 mg by mouth every 6 (six) hours as needed for nausea or vomiting.   Yes [provider]  OXYGEN Inhale 3 L into the lungs continuous.   Yes [provider]  PARoxetine (PAXIL) 20 MG tablet Take 20 mg by mouth daily.   Yes [provider]  Polyethyl Glycol-Propyl Glycol (SYSTANE) 0.4-0.3 % SOLN Place 1 drop into both eyes every 12 (twelve) hours as needed (dry eyes).   Yes [provider]  potassium chloride SA (KLOR-CON) 20 MEQ tablet Take 20 mEq by mouth daily as needed (with each dose of torsemide).   Yes [provider]  promethazine-dextromethorphan (PROMETHAZINE-DM) 6.25-15 MG/5ML syrup Take 5 mLs by mouth every 6 (six) hours as needed for cough.   Yes [provider]  senna-docusate (SENOKOT-S) 8.6-50 MG tablet Take 1 tablet by mouth every 12 (twelve) hours as needed (constipation).   Yes [provider]  tiZANidine (ZANAFLEX) 2 MG tablet Take 2 mg by mouth every 12 (twelve) hours as needed for muscle spasms.   Yes [provider]  torsemide (DEMADEX) 10 MG tablet Take 10 mg by mouth daily as needed (swelling).   Yes [provider]  traMADol (ULTRAM) 50 MG tablet Take 50 mg by mouth every 12 (twelve) hours as needed (chronic pain).   Yes [provider]   Physical Exam: Vitals with BMI 07/12/2020 7/0/2637 01/31/8849  Systolic 83 83 93  Diastolic 58 65 56  Pulse 88 67 54    1. General:  in No  Acute distress   Chronically ill  -appearing 2. Psychological: Alert and  Oriented to self 3. Head/ENT:    Dry Mucous Membranes                          Head Non traumatic, neck supple                            Poor Dentition 4. SKIN:  decreased Skin turgor,  Skin clean Dry and intact no rash 5. Heart: Regular rate and rhythm no  Murmur, no Rub or gallop 6. Lungs: some bilateral wheezes   7. Abdomen: Soft,  non-tender, Non distended  bowel sounds present 8. Lower extremities: no clubbing, cyanosis, no  edema 9. Neurologically Grossly intact, moving all 4 extremities equally   10. MSK: Normal range of motion   All other LABS:     Recent Labs  Lab 07/04/2020 1455 07/04/2020 1855  WBC 13.7*  --   HGB 10.5* 10.9*  HCT 34.6* 32.0*  MCV 91.3  --   PLT 250  --      Recent Labs  Lab 07/02/2020 1455 07/22/2020 1855 07/07/20 0004  NA 132* 133* 130*  K 5.5* 5.2* 6.1*  CL 93*  --  92*  CO2 28  --  28  GLUCOSE 102*  --  143*  BUN 45*  --  45*  CREATININE 2.20*  --  2.20*  CALCIUM 9.5  --  8.8*     Recent Labs  Lab 06/30/2020 1555  AST 85*  ALT 50*  ALKPHOS 131*  BILITOT 0.6  PROT 6.6  ALBUMIN 3.2*      Cultures: No results found for: SDES, SPECREQUEST, CULT, REPTSTATUS   Radiological Exams on Admission: DG Chest Portable 1 View  Result Date: 07/14/2020 CLINICAL DATA:  Shortness of breath over the last 5 days. EXAM: PORTABLE CHEST 1 VIEW COMPARISON:  05/10/2020 FINDINGS: Heart size upper limits of normal. Chronic aortic atherosclerotic calcification. Hazy pulmonary infiltrate,  more extensive on the right than the left. This could be due to viral or bacterial disease. No effusion. No dense consolidation or collapse. No acute bone finding. Chronic degenerative changes of the shoulders with likely chronic rotator cuff tears. IMPRESSION: 1. Hazy pulmonary infiltrate, right worse than left. This could be due to viral or bacterial disease. No dense consolidation or collapse. 2. Aortic atherosclerosis. Electronically Signed   By: Nelson Chimes M.D.   On: 06/29/2020 14:44    Chart has been reviewed  Assessment/Plan  87 y.o. female with medical history significant of DM2, COPD on 3L at baseline, GERD, HLD, HTN, dementia Admitted for septic shock due to CAP, new onset of A.fib  Present on Admission: . Septic shock ([place]) -   -SIRS criteria met with  elevated white blood cell count,       Component Value Date/Time   WBC 13.7 (H) 07/26/2020 1455    tachycardia   ,  Fever at the facility    Today's Vitals   07/19/2020 2130 07/22/2020 2215 07/20/2020 2245 07/03/2020 2318  BP: 110/66 112/70 109/82   Pulse:  99 92   Resp: _0 Temp:      TempSrc:      SpO2:   100%   Weight:    59 kg  Height:    _1  (1.626 m)  PainSc:          -Most likely source being: pulmonary,    Patient meeting criteria for Severe sepsis with    evidence of end organ damage/organ dysfunction such as   Acute Kidney Injury with Cr > 2,  Lab Results  Component Value Date   CREATININE 2.20 (H) 07/19/2020     SBP<90 mmhg or MAP < 65 mmhg,   Acute hypoxia requiring new supplemental oxygen, SpO2: 100 % O2 Flow Rate (L/min): 8 L/min    Patient is meeting criteria for SEPTIC SHOCK with    multiple SBP readings below 90 mmhg or MAP reading below 65 mmhg   - Obtain serial lactic acid and procalcitonin level.  - Initiated IV antibiotics   - await results of blood and urine culture  - Rehydrate aggressively        30cc/kg fluid BP now up and off levophed   12:06 AM  . COPD (chronic obstructive  pulmonary disease) ([place]) contributing to acute on chronic respiratory failure Given some wheezing we will add steroids. Albuterol as needed and Atrovent scheduled Repeat VBG to see if there is any evidence of progressive hypercapnia   . CAP (community acquired pneumonia) -for now continue broad-spectrum antibiotics with cefepime Vanco and azithromycin Check Legionella and strep pneumonia Check procalcitonin  . Acute on chronic respiratory failure with hypoxia and hypercapnia (HCC) -currently on nonrebreather.  At baseline of 3 L of oxygen due to COPD.  Per history history of CHF although no echo in the system.  Hold diuretics patient appears to be on a dry side obtain echogram   . Atrial fibrillation with RVR (HCC) -possible A. fib of RVR versus sinus tach with PACs. Repeat EKG. Obtain echogram .  Will need cardiology consult to further evaluate  Please consult in AM Heparinize for tonight    . GERD (gastroesophageal reflux disease) chronic stable continue PPI When able to tolerate p.o. currently IV  . Essential hypertension -given hypotension will hold home medication  . CKD (chronic kidney disease), stage III (HCC) -  -chronic avoid nephrotoxic medications such as NSAIDs, Vanco Zosyn combo,  avoid hypotension, continue to follow renal function Unclear if acute on chronic component will obtain urine electrolytes  . Type 2 diabetes mellitus with hyperlipidemia (HCC) -continue statin  - Order Sensitive SSI   -  check TSH and HgA1C Diet controlled at baseline   Anemia -obtain anemia panel and Hemoccult stool Would need to be taken in consideration patient need to be on anticoagulation  Hyperkalemia mild will repeat bmet after been rehydrated Other plan as per orders. Repeat k up to 6.1 Give lokelma  Repeat ECG Hold ACEi  Elevated LFT's likely in a setting of sepsis check CK and repeat No RUQ pain  DVT prophylaxis:    Heparin     Code Status:    Code Status: Not on file  FULL CODE  as per patient  family  I had personally discussed CODE STATUS with patient and family     Family Communication:   Family not at  Bedside  plan of care was discussed on the phone with  Son,   Disposition Plan:                             Back to current facility when stable                              Following barriers for discharge:                            Electrolytes corrected                               Anemia stable                                                         Afebrile, white count improving able to transition to PO antibiotics                             Will need to be able to tolerate PO                                                  Will need consultants to evaluate patient prior to discharge                      Would benefit from PT/OT eval prior to DC  Ordered                   Swallow eval - SLP ordered                                      Transition of care consulted                                       Consults called: PCCM is aware   Admission status:  ED Disposition    ED Disposition Condition [place]: [facility] [100100]  Level of Care: Progressive [102]  Admit to Progressive based on following criteria: RESPIRATORY PROBLEMS hypoxemic/hypercapnic respiratory failure that is responsive to NIPPV (BiPAP) or High Flow Nasal Cannula (6-80 lpm). Frequent assessment/intervention, no > Q2 hrs < Q4 hrs, to maintain oxygenation and pulmonary hygiene.  May admit patient to [facility] or [facility] if equivalent  level of care is available:: No  Covid Evaluation: Confirmed COVID Negative  Diagnosis: Septic shock [facility]) [[id number]]  Admitting Physician: Toy Baker [[id number]]  Attending Physician: Toy Baker [[id number]]  Estimated length of stay: 3 - 4 days  Certification:: I certify this patient will need inpatient services for at least 2 midnights          inpatient     I Expect 2 midnight  stay secondary to severity of patient's current illness need for inpatient interventions justified by the following:   hemodynamic instability despite optimal treatment (tachycardia  Hypotension  tachypnea  hypoxia, hypercapnia)  Severe lab/radiological/exam abnormalities including:    PNEmonia  and extensive comorbidities including:  DM2   CHF   COPD/asthma  CKD  dementia   That are currently affecting medical management.   I expect  patient to be hospitalized for 2 midnights requiring inpatient medical care.  Patient is at high risk for adverse outcome (such as loss of life or disability) if not treated.  Indication for inpatient stay as follows:  Severe change from baseline regarding mental status Hemodynamic instability despite maximal medical therapy,    inability to maintain oral hydration    New or worsening hypoxia  Need for IV antibiotics, IV fluids  IV anticoagulation    Level of care     SDU tele indefinitely please discontinue once patient no longer qualifies COVID-19 Labs    Lab Results  Component Value Date   [place] NEGATIVE 07/17/2020     Precautions: admitted as  Covid Negative      PPE: Used by the provider:   P100  eye Goggles,  Gloves     Juddson Cobern 07/07/2020, 2:12 AM    [facility]     after 2 AM please page floor coverage PA If 7AM-7PM, please contact the day team taking care of the patient using [email or web address]   Patient was evaluated in the context of the global COVID-19 pandemic, which necessitated consideration that the patient might be at risk for infection with the SARS-CoV-2 virus that causes COVID-19. Institutional protocols and algorithms that pertain to the evaluation of patients at risk for COVID-19 are in a state of rapid change based on information released by regulatory bodies including the CDC and federal and state organizations. These policies and algorithms were followed during the patient's care.

## 2020-07-07 NOTE — Progress Notes (Signed)
Pt arrived to the floor with the ED RN, alert to self. Pt having difficulty breathing when being transferred to bed. Switch pt from the 3l O2 [place] to a non -rebreather mask at 15 L O2. Pt still getting agitated and non able to breath. Skin tone being dusky. RRRN on her round walked in  With the CN and assessed patient. respiratory unit notified. Awaiting for respiratory response. Charge RN administers DUONED at this time. Pt had relief. Os Sat is 100% Patient is on AFIB HR in the 120s with spike of 140s. We'll continue to monitor.

## 2020-07-07 NOTE — ED Notes (Signed)
Alarm sounded in room, pt had removed O2 mask, and was hypoxic. This RN attempted to replace mask however pt was very combative. Striking this RN and snatching mask off face. Called for help and NT arrived to bedside to assist in replacing mask of face. Reassurance provided, breathing coaching. Combativeness resolved w/ O2 mask and emotional support. Pt on 8-10 LPM via NRB d/t mouth breathing. Pt repositioned to position of comfort and denies other needs at this time.

## 2020-07-07 NOTE — Consult Note (Signed)
Consultation  Referring Provider: Dr. Pietro Cassis     Primary Care Physician:  Street, [name], MD Primary Gastroenterologist: Althia Forts        Reason for Consultation: Dysphagia             HPI:   Hannah [name] is a 87 y.o. female with a past medical history significant for diabetes type 2, COPD on 3 L at baseline, GERD and advanced dementia, who presented to the ER on 07/10/2020 with a 5-day history of shortness of breath, cough and fever.  We are consulted in regards to a concern for dysphagia.    Per previous physicians notes patient had recently been diagnosed with PNA but had only started antibiotics yesterday.  O2 saturation 79 on room air and was needing 10 L.    Called and spoke with patient's son who is POA.  He gives me a little bit more background.  Apparently was diagnosed with pneumonia a few months ago and around that time also had some swallowing difficulties, apparently they discussed doing an EGD which she reports have been done about 5 years ago for dilation of a stricture, but this never got done while she was hospitalized at that time.  She has had a couple of episodes of difficulty swallowing her food most recently a few weeks ago.    Patient is unable to answer anything about her symptoms at time of exam, she is only oriented to self and the fact that she is in the hospital but is not sure exactly where.  Tells me that she is having trouble breathing and is hoarse and it is hard for her to talk, requests something to drink.  Tells me that she only wants [place] to do something "about my swallowing" if she is asleep.    Denies nausea or vomiting.  ER course: Chest x-ray shows a hazy pulmonary infiltrates right greater than left, initially hypotensive needing Levophed, now off  GI history: 03/30/2011 esophagram: Small hiatal hernia, mild esophageal reflux, tertiary contraction noted suggestive of nonspecific motility disorder 03/31/2011 gastric emptying study: Abnormal 2-hour  gastric retention time, demonstrating increased rate of gastric emptying, probable reflux or esophageal retention evident  Past Medical History:  Diagnosis Date  . COPD (chronic obstructive pulmonary disease) ([place])   . DM2 (diabetes mellitus, type 2) ([place])   . Exertional dyspnea   . GERD (gastroesophageal reflux disease)   . HLD (hyperlipidemia)   . HTN (hypertension)   . Pneumonia     Past Surgical History:  Procedure Laterality Date  . BRAIN SURGERY    . CHOLECYSTECTOMY    . L arm surgery    . TONSILLECTOMY    . TOTAL KNEE ARTHROPLASTY      Family History  Problem Relation Age of Onset  . Heart attack Other   . Heart disease Other      Social History   Tobacco Use  . Smoking status: Former Smoker    Packs/day: 1.00    Years: 30.00    Pack years: 30.00    Quit date: 09/27/2009    Years since quitting: 10.7  Substance Use Topics  . Alcohol use: No    Prior to Admission medications   Medication Sig Start Date End Date Taking? Authorizing Provider  acetaminophen (TYLENOL) 325 MG tablet Take 650 mg by mouth every 8 (eight) hours as needed (pain).   Yes [provider]  acetaminophen (TYLENOL) 500 MG tablet Take 500 mg by mouth 2 (  two) times daily.   Yes [provider]  albuterol (VENTOLIN HFA) 108 (90 Base) MCG/ACT inhaler Inhale 2 puffs into the lungs every 4 (four) hours as needed for wheezing or shortness of breath.   Yes [provider]  amitriptyline (ELAVIL) 25 MG tablet Take 25 mg by mouth at bedtime.   Yes [provider]  Ascorbic Acid (VITAMIN C) 1000 MG tablet Take 1,000 mg by mouth 2 (two) times daily.   Yes [provider]  aspirin EC 81 MG tablet Take 81 mg by mouth daily. Swallow whole.   Yes [provider]  azithromycin (ZITHROMAX) 250 MG tablet Take 250-500 mg by mouth See admin instructions. Take 2 tablets (500 mg) by mouth 1st day, then take 1 tablet (250 mg) daily on days 2-5   Yes [provider]  benzonatate (TESSALON) 100 MG capsule Take 100 mg by mouth every 8 (eight) hours as needed for cough.   Yes [provider]  bethanechol (URECHOLINE) 10 MG tablet Take 10 mg by mouth 2 (two) times daily.   Yes [provider]  Calcium Carb-Cholecalciferol (CALCIUM 600+D) 600-800 MG-UNIT TABS Take 1 tablet by mouth 2 (two) times daily.   Yes [provider]  carvedilol (COREG) 3.125 MG tablet Take 3.125 mg by mouth 2 (two) times daily.   Yes [provider]  dicyclomine (BENTYL) 10 MG capsule Take 10 mg by mouth 2 (two) times daily.   Yes [provider]  Ensure (ENSURE) Take 237 mLs by mouth 3 (three) times daily.   Yes [provider]  ferrous sulfate 325 (65 FE) MG tablet Take 325 mg by mouth 2 (two) times daily.   Yes [provider]  fluticasone (FLONASE) 50 MCG/ACT nasal spray Place 1 spray into both nostrils daily.   Yes [provider]  furosemide (LASIX) 40 MG tablet Take 40 mg by mouth daily.   Yes [provider]  ipratropium (ATROVENT) 0.02 % nebulizer solution Take 0.5 mg by nebulization 3 (three) times daily.   Yes [provider]  levothyroxine (SYNTHROID) 25 MCG tablet Take 25 mcg by mouth daily before breakfast.   Yes [provider]  loratadine (CLARITIN) 10 MG tablet Take 10 mg by mouth daily.   Yes [provider]  LORazepam (ATIVAN) 0.5 MG tablet Take 0.5 mg by mouth every 6 (six) hours as needed for anxiety.   Yes [provider]  meclizine (ANTIVERT) 25 MG tablet Take 25 mg by mouth every 6 (six) hours as needed for dizziness.   Yes [provider]  metFORMIN (GLUCOPHAGE) 500 MG tablet Take 500 mg by mouth 2 (two) times daily.   Yes [provider]  mirabegron ER (MYRBETRIQ) 25 MG TB24 tablet Take 25 mg by mouth at bedtime.   Yes [provider]  montelukast (SINGULAIR) 10 MG tablet Take 10 mg by mouth at bedtime.   Yes  [provider]  morphine (ROXANOL) 20 MG/ML concentrated solution Take 5 mg by mouth every 2 (two) hours as needed for severe pain or shortness of breath.   Yes [provider]  omeprazole (PRILOSEC) 40 MG capsule Take 40 mg by mouth daily before breakfast.   Yes [provider]  ondansetron (ZOFRAN) 4 MG tablet Take 4 mg by mouth every 6 (six) hours as needed for nausea or vomiting.   Yes [provider]  OXYGEN Inhale 3 L into the lungs continuous.   Yes [provider]  PARoxetine (PAXIL) 20 MG tablet Take 20 mg by mouth daily.   Yes [provider]  Polyethyl Glycol-Propyl Glycol (SYSTANE) 0.4-0.3 % SOLN Place 1 drop into both eyes every 12 (twelve) hours as needed (dry eyes).   Yes [provider]  potassium chloride SA (KLOR-CON) 20 MEQ tablet Take 20 mEq by mouth daily as needed (with each dose of torsemide).   Yes [provider]  promethazine-dextromethorphan (PROMETHAZINE-DM) 6.25-15 MG/5ML syrup Take 5 mLs by mouth every 6 (six) hours as needed for cough.   Yes [provider]  senna-docusate (SENOKOT-S) 8.6-50 MG tablet Take 1 tablet by mouth every 12 (twelve) hours as needed (constipation).   Yes [provider]  tiZANidine (ZANAFLEX) 2 MG tablet Take 2 mg by mouth every 12 (twelve) hours as needed for muscle spasms.   Yes [provider]  torsemide (DEMADEX) 10 MG tablet Take 10 mg by mouth daily as needed (swelling).   Yes [provider]  traMADol (ULTRAM) 50 MG tablet Take 50 mg by mouth every 12 (twelve) hours as needed (chronic pain).   Yes [provider]    Current Facility-Administered Medications  Medication Dose Route Frequency Provider Last Rate Last Admin  . 0.9 %  sodium chloride infusion   Intravenous Continuous Toy Baker, MD 75 mL/hr at 07/07/20 0624 New Bag at 07/07/20 [id number]  . acetaminophen (TYLENOL) tablet 650 mg  650 mg Oral Q6H PRN Toy Baker, MD       Or  . acetaminophen (TYLENOL) suppository 650 mg  650 mg Rectal Q6H PRN Doutova, Anastassia, MD      . albuterol (PROVENTIL) (2.5 MG/3ML) 0.083% nebulizer solution 2.5 mg  2.5 mg Nebulization Q2H PRN Doutova, Anastassia, MD      . azithromycin (ZITHROMAX) 500 mg in sodium chloride 0.9 % 250 mL IVPB  500 mg Intravenous Q24H Toy Baker, MD   Stopped at 07/02/2020 1846  . ceFEPIme (MAXIPIME) 2 g in sodium chloride 0.9 % 100 mL IVPB  2 g Intravenous Q24H Doutova, Anastassia, MD      . heparin ADULT infusion 100 units/mL (25000 units/253mL)  900 Units/hr Intravenous Continuous Laren Everts, RPH 9 mL/hr at 07/07/20 0251 900 Units/hr at 07/07/20 0251  . HYDROcodone-acetaminophen (NORCO/VICODIN) 5-325 MG per tablet 1-2 tablet  1-2 tablet Oral Q4H PRN Doutova, Anastassia, MD      . insulin aspart (novoLOG) injection 0-9 Units  0-9 Units Subcutaneous Q4H Doutova, Anastassia, MD   1 Units at 07/07/20 1328  . ipratropium (ATROVENT) nebulizer solution 0.5 mg  0.5 mg Nebulization Q6H Doutova, Anastassia, MD      . ipratropium-albuterol (DUONEB) 0.5-2.5 (3) MG/3ML nebulizer solution 3 mL  3 mL Nebulization Q6H PRN Doutova, Anastassia, MD   3 mL at 07/07/20 0949  . levothyroxine (SYNTHROID) tablet 25 mcg  25 mcg Oral QAC breakfast Toy Baker, MD   25 mcg at 07/07/20 [id number]  . methylPREDNISolone sodium succinate (SOLU-MEDROL) 40 mg/mL injection 40 mg  40 mg Intravenous Q12H Doutova, Nyoka Lint, MD   40 mg at 07/07/20 0625  . norepinephrine (LEVOPHED) 4mg  in 2105mL premix infusion  0-40 mcg/min Intravenous Continuous Toy Baker, MD   Held at 07/13/2020 2020  . pantoprazole (PROTONIX) injection 40 mg  40 mg Intravenous QHS Toy Baker, MD   40 mg at 07/07/20 0147  . vancomycin variable dose per unstable renal function (pharmacist dosing)   Does not apply See admin instructions Toy Baker, MD       Current  Outpatient Medications  Medication Sig Dispense  Refill  . acetaminophen (TYLENOL) 325 MG tablet Take 650 mg by mouth every 8 (eight) hours as needed (pain).    Marland Kitchen acetaminophen (TYLENOL) 500 MG tablet Take 500 mg by mouth 2 (two) times daily.    Marland Kitchen albuterol (VENTOLIN HFA) 108 (90 Base) MCG/ACT inhaler Inhale 2 puffs into the lungs every 4 (four) hours as needed for wheezing or shortness of breath.    Marland Kitchen amitriptyline (ELAVIL) 25 MG tablet Take 25 mg by mouth at bedtime.    . Ascorbic Acid (VITAMIN C) 1000 MG tablet Take 1,000 mg by mouth 2 (two) times daily.    Marland Kitchen aspirin EC 81 MG tablet Take 81 mg by mouth daily. Swallow whole.    Marland Kitchen azithromycin (ZITHROMAX) 250 MG tablet Take 250-500 mg by mouth See admin instructions. Take 2 tablets (500 mg) by mouth 1st day, then take 1 tablet (250 mg) daily on days 2-5    . benzonatate (TESSALON) 100 MG capsule Take 100 mg by mouth every 8 (eight) hours as needed for cough.    . bethanechol (URECHOLINE) 10 MG tablet Take 10 mg by mouth 2 (two) times daily.    . Calcium Carb-Cholecalciferol (CALCIUM 600+D) 600-800 MG-UNIT TABS Take 1 tablet by mouth 2 (two) times daily.    . carvedilol (COREG) 3.125 MG tablet Take 3.125 mg by mouth 2 (two) times daily.    Marland Kitchen dicyclomine (BENTYL) 10 MG capsule Take 10 mg by mouth 2 (two) times daily.    . Ensure (ENSURE) Take 237 mLs by mouth 3 (three) times daily.    . ferrous sulfate 325 (65 FE) MG tablet Take 325 mg by mouth 2 (two) times daily.    . fluticasone (FLONASE) 50 MCG/ACT nasal spray Place 1 spray into both nostrils daily.    . furosemide (LASIX) 40 MG tablet Take 40 mg by mouth daily.    Marland Kitchen ipratropium (ATROVENT) 0.02 % nebulizer solution Take 0.5 mg by nebulization 3 (three) times daily.    Marland Kitchen levothyroxine (SYNTHROID) 25 MCG tablet Take 25 mcg by mouth daily before breakfast.    . loratadine (CLARITIN) 10 MG tablet Take 10 mg by mouth daily.    Marland Kitchen LORazepam (ATIVAN) 0.5 MG tablet Take 0.5 mg by mouth every 6 (six) hours as needed for anxiety.    . meclizine  (ANTIVERT) 25 MG tablet Take 25 mg by mouth every 6 (six) hours as needed for dizziness.    . metFORMIN (GLUCOPHAGE) 500 MG tablet Take 500 mg by mouth 2 (two) times daily.    . mirabegron ER (MYRBETRIQ) 25 MG TB24 tablet Take 25 mg by mouth at bedtime.    . montelukast (SINGULAIR) 10 MG tablet Take 10 mg by mouth at bedtime.    Marland Kitchen morphine (ROXANOL) 20 MG/ML concentrated solution Take 5 mg by mouth every 2 (two) hours as needed for severe pain or shortness of breath.    Marland Kitchen omeprazole (PRILOSEC) 40 MG capsule Take 40 mg by mouth daily before breakfast.    . ondansetron (ZOFRAN) 4 MG tablet Take 4 mg by mouth every 6 (six) hours as needed for nausea or vomiting.    . OXYGEN Inhale 3 L into the lungs continuous.    Marland Kitchen PARoxetine (PAXIL) 20 MG tablet Take 20 mg by mouth daily.    Vladimir Faster Glycol-Propyl Glycol (SYSTANE) 0.4-0.3 % SOLN Place 1 drop into both eyes every 12 (twelve) hours as needed (dry eyes).    Marland Kitchen  potassium chloride SA (KLOR-CON) 20 MEQ tablet Take 20 mEq by mouth daily as needed (with each dose of torsemide).    . promethazine-dextromethorphan (PROMETHAZINE-DM) 6.25-15 MG/5ML syrup Take 5 mLs by mouth every 6 (six) hours as needed for cough.    . senna-docusate (SENOKOT-S) 8.6-50 MG tablet Take 1 tablet by mouth every 12 (twelve) hours as needed (constipation).    Marland Kitchen tiZANidine (ZANAFLEX) 2 MG tablet Take 2 mg by mouth every 12 (twelve) hours as needed for muscle spasms.    Marland Kitchen torsemide (DEMADEX) 10 MG tablet Take 10 mg by mouth daily as needed (swelling).    . traMADol (ULTRAM) 50 MG tablet Take 50 mg by mouth every 12 (twelve) hours as needed (chronic pain).      Allergies as of 07-16-20 - Review Complete 07/16/2020  Allergen Reaction Noted  . Codeine Nausea And Vomiting 10/11/2010  . Sulfa antibiotics Nausea And Vomiting 10/11/2010     Review of Systems:    Unable to complete due to dementia   Physical Exam:  Vital signs in last 24 hours: Temp:  [98 F (36.7 C)-98.7 F  (37.1 C)] 98.3 F (36.8 C) (01/09 0818) Pulse Rate:  [32-140] 140 (01/09 1130) Resp:  [11-24] 24 (01/09 1130) BP: (83-130)/(49-82) 118/74 (01/09 1130) SpO2:  [90 %-100 %] 96 % (01/09 1130) Weight:  [59 kg] 59 kg (01/08 2318)   General:   Pleasant Elderly Caucasian female appears to be in NAD, Well developed, Well nourished, alert and cooperative Head:  Normocephalic and atraumatic. Eyes:   PEERL, EOMI. No icterus. Conjunctiva pink. Ears:  Normal auditory acuity. Neck:  Supple Throat: Oral cavity and pharynx without inflammation, swelling or lesion. Dry mucous membranes Lungs: Respirations even and unlabored. Wheezes b/l Heart: Normal S1, S2. No MRG. Regular rate and rhythm. No peripheral edema, cyanosis or pallor.  Abdomen:  Soft, nondistended, nontender. No rebound or guarding. Normal bowel sounds. No appreciable masses or hepatomegaly. Rectal:  Not performed.  Msk:  Symmetrical without gross deformities. Peripheral pulses intact.  Extremities:  Without edema, no deformity or joint abnormality.  Neurologic:  Alert and  oriented only to self;  grossly normal neurologically.  Skin:   Dry and intact without significant lesions or rashes. Psychiatric: Demonstrates good judgement and reason without abnormal affect or behaviors.   LAB RESULTS: Recent Labs    07-16-2020 1455 07/16/20 1855 07/07/20 0300 07/07/20 0510  WBC 13.7*  --   --  13.4*  HGB 10.5* 10.9* 10.9* 10.0*  HCT 34.6* 32.0* 32.0* 34.3*  PLT 250  --   --  221   BMET Recent Labs    07/16/20 1455 07/16/2020 1855 07/07/20 0004 07/07/20 0300 07/07/20 0510  NA 132*   < > 130* 133* 132*  K 5.5*   < > 6.1* 5.9* 6.3*  CL 93*  --  92*  --  97*  CO2 28  --  28  --  23  GLUCOSE 102*  --  143*  --  127*  BUN 45*  --  45*  --  49*  CREATININE 2.20*  --  2.20*  --  2.18*  CALCIUM 9.5  --  8.8*  --  8.7*   < > = values in this interval not displayed.   LFT Recent Labs    July 16, 2020 1555 07/07/20 0510  PROT 6.6 6.2*   ALBUMIN 3.2* 2.8*  AST 85* 86*  ALT 50* 61*  ALKPHOS 131* 150*  BILITOT 0.6 0.8  BILIDIR 0.1  --  IBILI 0.5  --    PT/INR Recent Labs    07/13/2020 1555  LABPROT 14.2  INR 1.1    STUDIES: DG Chest Portable 1 View  Result Date: 07/27/2020 CLINICAL DATA:  Shortness of breath over the last 5 days. EXAM: PORTABLE CHEST 1 VIEW COMPARISON:  05/10/2020 FINDINGS: Heart size upper limits of normal. Chronic aortic atherosclerotic calcification. Hazy pulmonary infiltrate, more extensive on the right than the left. This could be due to viral or bacterial disease. No effusion. No dense consolidation or collapse. No acute bone finding. Chronic degenerative changes of the shoulders with likely chronic rotator cuff tears. IMPRESSION: 1. Hazy pulmonary infiltrate, right worse than left. This could be due to viral or bacterial disease. No dense consolidation or collapse. 2. Aortic atherosclerosis. Electronically Signed   By: Nelson Chimes M.D.   On: 07/13/2020 14:44    Impression / Plan:   Impression: 1.  Dysphagia: Patient seen by speech pathology today for bedside swallow evaluation, she reported globus with liquids and solids, also complains of a pill getting stuck since admission and daughter reported episodes about a month ago hamburger got lodged in her throat (distant history of stricture and dilation?),  At that time recommend a full liquid diet and GI eval 2.  CAP 3.  Acute on chronic respiratory failure with hypoxia and hypercapnia 4.  History of CHF 5.  A. fib with RVR 6.  GERD 7.  CKD stage III 8.  Anemia 9.  Elevated LFTs 10.  COPD  Plan: 1.  Today, discussed with the patient's POA her son on the phone that due to her acute issues with pneumonia would not recommend putting her to sleep for an EGD until she improves from a respiratory standpoint.  He is in agreement but would like her to have an EGD prior to leaving the hospital for fear that this may not be done if she goes without  1. 2.  For now we will order a swallow study to be completed. 3.  Increased Pantoprazole to 40 mg twice daily IV for now 4.  Please await any further recommendations from Dr. Hilarie Fredrickson later today.  Thank you for your kind consultation, we will continue to follow.  Lavone Nian Dietrich Samuelson  07/07/2020, 2:01 PM

## 2020-07-07 NOTE — Progress Notes (Signed)
ANTICOAGULATION CONSULT NOTE - Initial Consult  Pharmacy Consult for heparin Indication: atrial fibrillation  Allergies  Allergen Reactions  . Codeine Nausea And Vomiting  . Sulfa Antibiotics Nausea And Vomiting    Patient Measurements: Height: 5\' 4"  (162.6 cm) Weight: 59 kg (130 lb) IBW/kg (Calculated) : 54.7  Vital Signs: Temp: 98 F (36.7 C) (01/08 2000) BP: 112/61 (01/09 0147) Pulse Rate: 32 (01/09 0147)  Labs: Recent Labs    07/24/2020 1455 07/24/2020 1555 07/19/2020 1855 07/07/20 0004  HGB 10.5*  --  10.9*  --   HCT 34.6*  --  32.0*  --   PLT 250  --   --   --   APTT  --  30  --   --   LABPROT  --  14.2  --   --   INR  --  1.1  --   --   CREATININE 2.20*  --   --  2.20*  CKTOTAL  --   --   --  17*    Estimated Creatinine Clearance: 15.9 mL/min (A) (by C-G formula based on SCr of 2.2 mg/dL (H)).   Medical History: Past Medical History:  Diagnosis Date  . COPD (chronic obstructive pulmonary disease) ([place])   . DM2 (diabetes mellitus, type 2) ([place])   . Exertional dyspnea   . GERD (gastroesophageal reflux disease)   . HLD (hyperlipidemia)   . HTN (hypertension)   . Pneumonia     Assessment: 87yo female admitted w/ sepsis and found to be in Afib w/ RVR, to begin heparin.  Goal of Therapy:  Heparin level 0.3-0.7 units/ml Monitor platelets by anticoagulation protocol: Yes   Plan:  Will give heparin 2000 units IV bolus x1 followed by gtt at 900 units/hr and monitor heparin levels and CBC.  Wynona Neat, PharmD, BCPS  07/07/2020,2:25 AM

## 2020-07-07 NOTE — ED Notes (Signed)
Attempted report 2x.   Will have to wait till shift change now

## 2020-07-07 NOTE — ED Notes (Signed)
Pt not tolerating NRB mask, states she cant breath and that it is twisted.  transitioned to [place] on 6L and is maintaining sats in mid 90s when she keeps it in place

## 2020-07-07 NOTE — Evaluation (Signed)
Clinical/Bedside Swallow Evaluation Patient Details  Name: Hannah [name] MRN: [id number] Date of Birth: May 25, 1932  Today's Date: 07/07/2020 Time: SLP Start Time (ACUTE ONLY): 1050 SLP Stop Time (ACUTE ONLY): 1107 SLP Time Calculation (min) (ACUTE ONLY): 17 min  Past Medical History:  Past Medical History:  Diagnosis Date  . COPD (chronic obstructive pulmonary disease) ([place])   . DM2 (diabetes mellitus, type 2) ([place])   . Exertional dyspnea   . GERD (gastroesophageal reflux disease)   . HLD (hyperlipidemia)   . HTN (hypertension)   . Pneumonia    Past Surgical History:  Past Surgical History:  Procedure Laterality Date  . BRAIN SURGERY    . CHOLECYSTECTOMY    . L arm surgery    . TONSILLECTOMY    . TOTAL KNEE ARTHROPLASTY     HPI:  87 year old female from nursing home coming in with sepsis due to multifocal pneumonia and acute hypoxic respiratory failure   Assessment / Plan / Recommendation Clinical Impression  Pt demonstrates no immediate signs of aspiration, but does report globus with liquids and solids. she touched her sternum to indicate sensation. RN observed pt to complain a pill was stuck and daughter reports an episode a month ago in which pt had hamburger lodged at some point in her throat. Daughter also reports a history of stricture and dilatation. Pt is recommended to continue a full liquid diet at this time, and may need f/u with GI to address these concerns. Advised ED RN to crush meds. Will f/u for needs. SLP Visit Diagnosis: Dysphagia, unspecified (R13.10)    Aspiration Risk  Moderate aspiration risk    Diet Recommendation Thin liquid   Liquid Administration via: Cup;Straw Medication Administration: Crushed with puree Supervision: Staff to assist with self feeding Compensations: Slow rate;Small sips/bites Postural Changes: Seated upright at 90 degrees;Remain upright for at least 30 minutes after po intake    Other  Recommendations Oral Care Recommendations:  Oral care BID Other Recommendations: Have oral suction available   Follow up Recommendations Skilled Nursing facility      Frequency and Duration min 2x/week  2 weeks       Prognosis Prognosis for Safe Diet Advancement: Good      Swallow Study   General HPI: 87 year old female from nursing home coming in with sepsis due to multifocal pneumonia and acute hypoxic respiratory failure Type of Study: Bedside Swallow Evaluation Diet Prior to this Study: NPO Temperature Spikes Noted: No Respiratory Status: Room air History of Recent Intubation: No Behavior/Cognition: Alert;Cooperative;Pleasant mood Oral Cavity Assessment: Within Functional Limits Oral Care Completed by SLP: No Oral Cavity - Dentition: Adequate natural dentition Vision: Functional for self-feeding Self-Feeding Abilities: Able to feed self Patient Positioning: Upright in bed Baseline Vocal Quality: Hoarse Volitional Cough: Strong Volitional Swallow: Able to elicit    Oral/Motor/Sensory Function Overall Oral Motor/Sensory Function: Within functional limits   Ice Chips     Thin Liquid Thin Liquid: Within functional limits    Nectar Thick Nectar Thick Liquid: Not tested   Honey Thick Honey Thick Liquid: Not tested   Puree Puree: Within functional limits   Solid     Solid: Not tested     Herbie [place], MA [place] Pager [phone] Office [phone]  Lynann Beaver 07/07/2020,1:00 PM

## 2020-07-07 NOTE — ED Notes (Signed)
Episode of incontinence, one large bowl movement, some liquid and some solid pieces.  Cleaned and changed into new brief.   Pt states improvement in discomfort after BM.  Asking where family is and told her son was updated on pt status this morning

## 2020-07-07 NOTE — Progress Notes (Signed)
PROGRESS NOTE  Hannah [name]  DOB: Feb 18, 1932  PCP: Emmaline Kluver, MD WUJ:[id number]  DOA: 08/05/20  LOS: 1 day   Chief Complaint  Patient presents with  . Shortness of Breath    Brief narrative: Hannah [name] is a 87 y.o. female with PMH significant for DM2, HTN, HLD, chronic systolic diastolic CHF, COPD on 3 L at baseline, GERD, dementia.  She is a nursing home resident, baseline wheelchair-bound and has only mild dementia. Patient was sent to the ED from SNF on 1/8 for complaint of shortness of breath, cough and ultimately status.  She started having symptoms 5 days ago but could not get started on antibiotics until 1/8.  EMS was called.  She was noted to have oxygen saturation low at 79%.  She was brought to the ED on nonrebreather mask.  In the ED, chest x-ray showed hazy pulmonary infiltrates, right more than left. She was initially hypotensive and briefly required Levophed. Patient was admitted to hospitalist service. Critical care consultation was obtained.  Subjective: Patient was seen and examined this morning. Elderly Caucasian female.  Propped up in bed.  Not in distress.  Was being seen by speech therapist.  Family not at bedside.  Assessment/Plan: Septic shock - POA healthcare associated pneumonia -Presented with 5 days history of shortness of breath, cough,  -On presentation had low blood pressure requiring pressors, leukocytosis, elevated procalcitonin, elevated creatinine -Chest x-ray with bilateral hazy pulmonary infiltrates. -Currently on IV cefepime, IV vancomycin -Follow-up culture reports. Recent Labs  Lab 08/05/20 0003 August 05, 2020 1455 08-05-20 1555 07/07/20 0004 07/07/20 0155 07/07/20 0510  WBC  --  13.7*  --   --   --  13.4*  LATICACIDVEN 1.3  --  1.2  --  1.2  --   PROCALCITON  --   --   --  0.43  --  0.37   COPD exacerbation -On IV Solu-Medrol 40 mg twice daily and bronchodilators.    Acute on chronic respiratory failure with hypoxia and  hypercapnia -At baseline on 3 L oxygen by nasal cannula. Initially required higher flow. Wean down as tolerated.    Chronic dysphagia/chronic aspiration -Speech therapy evaluation obtained. Mo0derate aspiration risk. Per speech therapy, patient probably has esophageal dysphagia.  She reported that she felt her food and liquids would not go down.  Per family, she has history of esophageal stricture requiring dilation in the past.   -GI consultation called.  May need dilatation again.    history of CHF  Essential hypertension -Pending echo.  Clinically dry.  Currently on normal saline at 75 mill per hour which I will continue. -Diovan on hold.  Atrial fibrillation with RVR  -I do not see any history of A. Fib.  -EKG on admission showed A. fib. Her heart rate is currently elevated to 130s. -I will start the patient on metoprolol 12.5 mg twice daily.  Dose to be adjusted based on response. -Patient was started on heparin drip on admission. Continue the same.    CKD 5 -Probably at baseline [facility] Recent Labs    2020-08-05 1455 07/07/20 0004 07/07/20 0510  BUN 45* 45* 49*  CREATININE 2.20* 2.20* 2.18*   Type 2 diabetes mellitus  -A1c 5.3 on 1/9. -Continue sliding scale with Accu-Cheks. Recent Labs  Lab 08/05/20 2346 07/07/20 0446 07/07/20 0941 07/07/20 1253  GLUCAP 130* 120* 137* 141*   Hyperlipidemia  -Continue statin.   Hyperkalemia  -Unclear cause.  Given a dose of rectal Kayexalate today.  Repeat  tomorrow. -ACE inhibitor is on hold. Recent Labs  Lab 09-Jul-2020 1455 07-09-2020 1855 07/07/20 0004 07/07/20 0300 07/07/20 0510  K 5.5* 5.2* 6.1* 5.9* 6.3*   Elevated LFT Recent Labs  Lab Jul 09, 2020 1555 07/07/20 0510  AST 85* 86*  ALT 50* 61*  ALKPHOS 131* 150*  BILITOT 0.6 0.8  PROT 6.6 6.2*  ALBUMIN 3.2* 2.8*   GERD -chronic stable continue PPI  Home meds include Aspirin 81 mg daily, Coreg 3.125 mg twice daily, Demadex, ?lasix Metformin 5 mg daily, Elavil  25 mg at bedtime Tessalon Perles, Flonase, Atrovent, Singulair, Iron sulfate, Prilosec 40 mg daily, Synthroid 25 mcg daily, Ativan 0.5 mg 3 times daily as needed, paroxetine 20 mg daily, Zanaflex as needed, tramadol 50 mg twice daily as needed. Myrbetriq 25 mg at bedtime, Potassium chloride,   Mobility: PT eval Code Status:   Code Status: Full Code  Nutritional status: Body mass index is 22.31 kg/m.     Diet Order            Diet full liquid Room service appropriate? Yes; Fluid consistency: Thin  Diet effective now                 DVT prophylaxis: SCDs   Antimicrobials:  Azithromycin Fluid: Normal saline at 75 mill per hour Consultants: GI Family Communication:  None at bedside  Status is: Inpatient  Remains inpatient appropriate because: On IV antibiotics, needs GI evaluation   Dispo: The patient is from: SNF              Anticipated d/c is to: SNF              Anticipated d/c date is: 3 days              Patient currently is not medically stable to d/c.       Infusions:  . sodium chloride 75 mL/hr at 07/07/20 0624  . azithromycin Stopped (07-09-2020 1846)  . ceFEPime (MAXIPIME) IV    . heparin 900 Units/hr (07/07/20 0251)  . norepinephrine (LEVOPHED) Adult infusion Stopped (09-Jul-2020 2020)    Scheduled Meds: . amitriptyline  25 mg Oral QHS  . carvedilol  3.125 mg Oral BID  . insulin aspart  0-9 Units Subcutaneous Q4H  . ipratropium  0.5 mg Nebulization Q6H  . levothyroxine  25 mcg Oral QAC breakfast  . methylPREDNISolone (SOLU-MEDROL) injection  40 mg Intravenous Q12H  . pantoprazole (PROTONIX) IV  40 mg Intravenous QHS  . vancomycin variable dose per unstable renal function (pharmacist dosing)   Does not apply See admin instructions    Antimicrobials: Anti-infectives (From admission, onward)   Start     Dose/Rate Route Frequency Ordered Stop   07/07/20 2300  ceFEPIme (MAXIPIME) 2 g in sodium chloride 0.9 % 100 mL IVPB        2 g 200 mL/hr over 30  Minutes Intravenous Every 24 hours 07-09-2020 2331     09-Jul-2020 2331  vancomycin variable dose per unstable renal function (pharmacist dosing)         Does not apply See admin instructions 07/09/2020 2331     July 09, 2020 2330  vancomycin (VANCOREADY) IVPB 1250 mg/250 mL        1,250 mg 166.7 mL/hr over 90 Minutes Intravenous  Once 2020-07-09 2325 07/07/20 0317   07-09-20 2330  ceFEPIme (MAXIPIME) 2 g in sodium chloride 0.9 % 100 mL IVPB        2 g 200 mL/hr over 30 Minutes Intravenous  Once 07/11/2020 2325 07/07/20 0024   07/02/2020 1600  cefTRIAXone (ROCEPHIN) 2 g in sodium chloride 0.9 % 100 mL IVPB  Status:  Discontinued        2 g 200 mL/hr over 30 Minutes Intravenous Every 24 hours 07/29/2020 1557 07/08/2020 2325   06/30/2020 1600  azithromycin (ZITHROMAX) 500 mg in sodium chloride 0.9 % 250 mL IVPB        500 mg 250 mL/hr over 60 Minutes Intravenous Every 24 hours 07/25/2020 1557        PRN meds: acetaminophen **OR** acetaminophen, albuterol, HYDROcodone-acetaminophen, ipratropium-albuterol   Objective: Vitals:   07/07/20 0830 07/07/20 1130  BP: 111/82 118/74  Pulse: (!) 139 (!) 140  Resp: (!) 22 (!) 24  Temp:    SpO2: 94% 96%    Intake/Output Summary (Last 24 hours) at 07/07/2020 1503 Last data filed at 07/07/2020 0317 Gross per 24 hour  Intake 2696.52 ml  Output -  Net 2696.52 ml   Filed Weights   06/30/2020 2318  Weight: 59 kg   Weight change:  Body mass index is 22.31 kg/m.   Physical Exam: General exam: Pleasant, elderly female, not in distress Skin: No rashes, lesions or ulcers. HEENT: Atraumatic, normocephalic, no obvious bleeding Lungs: Diminished air entry in both bases CVS: Tachycardic, irregular, no murmur GI/Abd soft, nontender, nondistended, bowel sound present CNS: Alert, awake, oriented to place Psychiatry: Mood appropriate Extremities: No pedal edema, no calf tenderness  Data Review: I have personally reviewed the laboratory data and studies available.  Recent  Labs  Lab 07/29/2020 1455 06/30/2020 1855 07/07/20 0300 07/07/20 0510  WBC 13.7*  --   --  13.4*  NEUTROABS  --   --   --  11.4*  HGB 10.5* 10.9* 10.9* 10.0*  HCT 34.6* 32.0* 32.0* 34.3*  MCV 91.3  --   --  94.5  PLT 250  --   --  221   Recent Labs  Lab 07/11/2020 1455 07/10/2020 1855 07/07/20 0004 07/07/20 0300 07/07/20 0510  NA 132* 133* 130* 133* 132*  K 5.5* 5.2* 6.1* 5.9* 6.3*  CL 93*  --  92*  --  97*  CO2 28  --  28  --  23  GLUCOSE 102*  --  143*  --  127*  BUN 45*  --  45*  --  49*  CREATININE 2.20*  --  2.20*  --  2.18*  CALCIUM 9.5  --  8.8*  --  8.7*  MG  --   --   --   --  2.1  PHOS  --   --   --   --  5.9*    F/u labs ordered  Signed, Terrilee Croak, MD [facility] 07/07/2020

## 2020-07-07 NOTE — ED Notes (Signed)
Pt given water to drink before attempting to swallow pill. Pt had trouble swallowing small pill, with a lot of coughing and stating it was stuck and to drink more water and sit up to pass medication

## 2020-07-08 ENCOUNTER — Inpatient Hospital Stay (HOSPITAL_COMMUNITY)

## 2020-07-08 ENCOUNTER — Inpatient Hospital Stay (HOSPITAL_COMMUNITY): Admitting: Certified Registered"

## 2020-07-08 DIAGNOSIS — I350 Nonrheumatic aortic (valve) stenosis: Secondary | ICD-10-CM | POA: Diagnosis not present

## 2020-07-08 DIAGNOSIS — I340 Nonrheumatic mitral (valve) insufficiency: Secondary | ICD-10-CM | POA: Diagnosis not present

## 2020-07-08 DIAGNOSIS — I4891 Unspecified atrial fibrillation: Secondary | ICD-10-CM | POA: Diagnosis not present

## 2020-07-08 DIAGNOSIS — I351 Nonrheumatic aortic (valve) insufficiency: Secondary | ICD-10-CM | POA: Diagnosis not present

## 2020-07-08 DIAGNOSIS — R1310 Dysphagia, unspecified: Secondary | ICD-10-CM | POA: Diagnosis not present

## 2020-07-08 DIAGNOSIS — J189 Pneumonia, unspecified organism: Secondary | ICD-10-CM

## 2020-07-08 LAB — GLUCOSE, CAPILLARY
Glucose-Capillary: 117 mg/dL — ABNORMAL HIGH (ref 70–99)
Glucose-Capillary: 123 mg/dL — ABNORMAL HIGH (ref 70–99)
Glucose-Capillary: 125 mg/dL — ABNORMAL HIGH (ref 70–99)
Glucose-Capillary: 143 mg/dL — ABNORMAL HIGH (ref 70–99)
Glucose-Capillary: 151 mg/dL — ABNORMAL HIGH (ref 70–99)
Glucose-Capillary: 153 mg/dL — ABNORMAL HIGH (ref 70–99)
Glucose-Capillary: 169 mg/dL — ABNORMAL HIGH (ref 70–99)

## 2020-07-08 LAB — POCT I-STAT 7, (LYTES, BLD GAS, ICA,H+H)
Acid-base deficit: 3 mmol/L — ABNORMAL HIGH (ref 0.0–2.0)
Bicarbonate: 22.9 mmol/L (ref 20.0–28.0)
Calcium, Ion: 1.12 mmol/L — ABNORMAL LOW (ref 1.15–1.40)
HCT: 31 % — ABNORMAL LOW (ref 36.0–46.0)
Hemoglobin: 10.5 g/dL — ABNORMAL LOW (ref 12.0–15.0)
O2 Saturation: 100 %
Patient temperature: 98
Potassium: 5.6 mmol/L — ABNORMAL HIGH (ref 3.5–5.1)
Sodium: 129 mmol/L — ABNORMAL LOW (ref 135–145)
TCO2: 24 mmol/L (ref 22–32)
pCO2 arterial: 44 mmHg (ref 32.0–48.0)
pH, Arterial: 7.323 — ABNORMAL LOW (ref 7.350–7.450)
pO2, Arterial: 376 mmHg — ABNORMAL HIGH (ref 83.0–108.0)

## 2020-07-08 LAB — ECHOCARDIOGRAM COMPLETE
AR max vel: 1.1 cm2
AV Area VTI: 1.23 cm2
AV Area mean vel: 1.07 cm2
AV Mean grad: 9.8 mmHg
AV Peak grad: 15.6 mmHg
Ao pk vel: 1.98 m/s
Calc EF: 40.1 %
Height: 64 in
MV M vel: 5.06 m/s
MV Peak grad: 102.4 mmHg
S' Lateral: 2.4 cm
Single Plane A2C EF: 38 %
Single Plane A4C EF: 40.2 %
Weight: 2080 oz

## 2020-07-08 LAB — COMPREHENSIVE METABOLIC PANEL
ALT: 100 U/L — ABNORMAL HIGH (ref 0–44)
AST: 122 U/L — ABNORMAL HIGH (ref 15–41)
Albumin: 2.7 g/dL — ABNORMAL LOW (ref 3.5–5.0)
Alkaline Phosphatase: 168 U/L — ABNORMAL HIGH (ref 38–126)
Anion gap: 15 (ref 5–15)
BUN: 67 mg/dL — ABNORMAL HIGH (ref 8–23)
CO2: 17 mmol/L — ABNORMAL LOW (ref 22–32)
Calcium: 7.9 mg/dL — ABNORMAL LOW (ref 8.9–10.3)
Chloride: 98 mmol/L (ref 98–111)
Creatinine, Ser: 2.23 mg/dL — ABNORMAL HIGH (ref 0.44–1.00)
GFR, Estimated: 21 mL/min — ABNORMAL LOW (ref >60)
Glucose, Bld: 231 mg/dL — ABNORMAL HIGH (ref 70–99)
Potassium: 5.8 mmol/L — ABNORMAL HIGH (ref 3.5–5.1)
Sodium: 130 mmol/L — ABNORMAL LOW (ref 135–145)
Total Bilirubin: 0.5 mg/dL (ref 0.3–1.2)
Total Protein: 5.8 g/dL — ABNORMAL LOW (ref 6.5–8.1)

## 2020-07-08 LAB — TROPONIN I (HIGH SENSITIVITY)
Troponin I (High Sensitivity): 61 ng/L — ABNORMAL HIGH (ref <18)
Troponin I (High Sensitivity): 59 ng/L — ABNORMAL HIGH (ref <18)

## 2020-07-08 LAB — BASIC METABOLIC PANEL
Anion gap: 13 (ref 5–15)
BUN: 63 mg/dL — ABNORMAL HIGH (ref 8–23)
CO2: 20 mmol/L — ABNORMAL LOW (ref 22–32)
Calcium: 8.3 mg/dL — ABNORMAL LOW (ref 8.9–10.3)
Chloride: 95 mmol/L — ABNORMAL LOW (ref 98–111)
Creatinine, Ser: 2.22 mg/dL — ABNORMAL HIGH (ref 0.44–1.00)
GFR, Estimated: 21 mL/min — ABNORMAL LOW (ref >60)
Glucose, Bld: 149 mg/dL — ABNORMAL HIGH (ref 70–99)
Potassium: 6 mmol/L — ABNORMAL HIGH (ref 3.5–5.1)
Sodium: 128 mmol/L — ABNORMAL LOW (ref 135–145)

## 2020-07-08 LAB — CBC
HCT: 33.7 % — ABNORMAL LOW (ref 36.0–46.0)
HCT: 32.8 % — ABNORMAL LOW (ref 36.0–46.0)
Hemoglobin: 10 g/dL — ABNORMAL LOW (ref 12.0–15.0)
Hemoglobin: 9.9 g/dL — ABNORMAL LOW (ref 12.0–15.0)
MCH: 27.5 pg (ref 26.0–34.0)
MCH: 27.8 pg (ref 26.0–34.0)
MCHC: 29.7 g/dL — ABNORMAL LOW (ref 30.0–36.0)
MCHC: 30.2 g/dL (ref 30.0–36.0)
MCV: 92.8 fL (ref 80.0–100.0)
MCV: 92.1 fL (ref 80.0–100.0)
Platelets: 256 10*3/uL (ref 150–400)
Platelets: 269 10*3/uL (ref 150–400)
RBC: 3.63 MIL/uL — ABNORMAL LOW (ref 3.87–5.11)
RBC: 3.56 MIL/uL — ABNORMAL LOW (ref 3.87–5.11)
RDW: 16.5 % — ABNORMAL HIGH (ref 11.5–15.5)
RDW: 16.5 % — ABNORMAL HIGH (ref 11.5–15.5)
WBC: 16.1 10*3/uL — ABNORMAL HIGH (ref 4.0–10.5)
WBC: 13.7 10*3/uL — ABNORMAL HIGH (ref 4.0–10.5)
nRBC: 0 % (ref 0.0–0.2)
nRBC: 0.4 % — ABNORMAL HIGH (ref 0.0–0.2)

## 2020-07-08 LAB — LEGIONELLA PNEUMOPHILA SEROGP 1 UR AG: L. pneumophila Serogp 1 Ur Ag: NEGATIVE

## 2020-07-08 LAB — HEPARIN LEVEL (UNFRACTIONATED): Heparin Unfractionated: 0.42 IU/mL (ref 0.30–0.70)

## 2020-07-08 LAB — PROCALCITONIN: Procalcitonin: 0.32 ng/mL

## 2020-07-08 MED ORDER — MIDAZOLAM HCL 2 MG/2ML IJ SOLN
INTRAMUSCULAR | Status: AC
  Filled 2020-07-08: qty 2

## 2020-07-08 MED ORDER — POLYETHYLENE GLYCOL 3350 17 G PO PACK
17.0000 g | PACK | Freq: Every day | ORAL | Status: DC
  Administered 2020-07-08 – 2020-07-09 (×2): 17 g
  Filled 2020-07-08: qty 1

## 2020-07-08 MED ORDER — LEVOTHYROXINE SODIUM 25 MCG PO TABS
25.0000 ug | ORAL_TABLET | Freq: Every day | ORAL | Status: DC
  Administered 2020-07-09: 25 ug
  Filled 2020-07-08: qty 1

## 2020-07-08 MED ORDER — FENTANYL CITRATE (PF) 100 MCG/2ML IJ SOLN
INTRAMUSCULAR | Status: AC
  Administered 2020-07-08: 50 ug via INTRAVENOUS
  Filled 2020-07-08: qty 2

## 2020-07-08 MED ORDER — SODIUM ZIRCONIUM CYCLOSILICATE 5 G PO PACK
5.0000 g | PACK | Freq: Once | ORAL | Status: DC
  Filled 2020-07-08: qty 1

## 2020-07-08 MED ORDER — METOPROLOL TARTRATE 5 MG/5ML IV SOLN: 2.5000 mg | INTRAVENOUS | Status: DC | PRN

## 2020-07-08 MED ORDER — FENTANYL 2500MCG IN NS 250ML (10MCG/ML) PREMIX INFUSION
25.0000 ug/h | INTRAVENOUS | Status: DC
  Administered 2020-07-08: 50 ug/h via INTRAVENOUS
  Filled 2020-07-08 (×2): qty 250

## 2020-07-08 MED ORDER — FENTANYL BOLUS VIA INFUSION
25.0000 ug | INTRAVENOUS | Status: DC | PRN
  Administered 2020-07-08 – 2020-07-09 (×3): 25 ug via INTRAVENOUS
  Filled 2020-07-08: qty 25

## 2020-07-08 MED ORDER — FENTANYL CITRATE (PF) 100 MCG/2ML IJ SOLN
50.0000 ug | Freq: Once | INTRAMUSCULAR | Status: AC
Start: 2020-07-08 — End: 2020-07-08

## 2020-07-08 MED ORDER — SODIUM ZIRCONIUM CYCLOSILICATE 10 G PO PACK
10.0000 g | PACK | Freq: Once | ORAL | Status: AC
  Administered 2020-07-08: 10 g
  Filled 2020-07-08 (×2): qty 1

## 2020-07-08 MED ORDER — CHLORHEXIDINE GLUCONATE CLOTH 2 % EX PADS
6.0000 | MEDICATED_PAD | Freq: Every day | CUTANEOUS | Status: DC
  Administered 2020-07-10 – 2020-07-18 (×7): 6 via TOPICAL

## 2020-07-08 MED ORDER — DOCUSATE SODIUM 50 MG/5ML PO LIQD
100.0000 mg | Freq: Two times a day (BID) | ORAL | Status: DC
  Administered 2020-07-08 – 2020-07-09 (×2): 100 mg
  Filled 2020-07-08: qty 10

## 2020-07-08 MED ORDER — METOPROLOL TARTRATE 5 MG/5ML IV SOLN: 2.5000 mg | Freq: Four times a day (QID) | INTRAVENOUS | Status: DC | PRN

## 2020-07-08 MED ORDER — FENTANYL CITRATE (PF) 100 MCG/2ML IJ SOLN: 25.0000 ug | Freq: Once | INTRAMUSCULAR | Status: DC

## 2020-07-08 NOTE — Progress Notes (Signed)
Bagged with 100% FIO2, transported intubated pt to [facility]. ETT secure, placed pt on vent, Servo I, full support, passed off report to RT. Safe transport noted.

## 2020-07-08 NOTE — Anesthesia Procedure Notes (Signed)
Procedure Name: Intubation Date/Time: 07/08/2020 5:57 PM Performed by: Moshe Salisbury, CRNA Pre-anesthesia Checklist: Patient identified, Emergency Drugs available, Suction available and Patient being monitored Patient Re-evaluated:Patient Re-evaluated prior to induction Oxygen Delivery Method: Ambu bag Preoxygenation: Pre-oxygenation with 100% oxygen Induction Type: IV induction Laryngoscope Size: Mac and 3 Grade View: Grade I Tube type: Oral Tube size: 7.5 mm Number of attempts: 1 Airway Equipment and Method: Stylet Placement Confirmation: ETT inserted through vocal cords under direct vision,  positive ETCO2 and breath sounds checked- equal and bilateral Secured at: 21 cm Tube secured with: Tape Dental Injury: Teeth and Oropharynx as per pre-operative assessment  Comments: Called to code blue and upon arrival RT was intubating, negative color change, negative breath sounds. Grade I view with Mac 3, Capnography showing good CO2 waveform.  ETT secured at 21 cm at right corner of mouth.

## 2020-07-08 NOTE — Progress Notes (Signed)
OT Cancellation Note  Patient Details Name: Hannah [name] MRN: [id number] DOB: [birth year]   Cancelled Treatment:    Reason Eval/Treat Not Completed: Patient at procedure or test/ unavailable (ECHO LAB) OT will continue to follow for evaluation  Jaci Carrel 07/08/2020, 1:31 PM   Jesse Sans OTR/L Acute Rehabilitation Services Pager: [phone] Office: [phone]

## 2020-07-08 NOTE — Consult Note (Incomplete)
NAME:  Hannah [name], MRN:  [id number], DOB:  26-Sep-1931, LOS: 2 ADMISSION DATE:  07/13/2020, CONSULTATION DATE: 07/11/2020 REFERRING MD: Dr. Dina Rich from ED, CHIEF COMPLAINT: Shortness of breath cough and altered mental status  Brief History:  87 year old female from nursing home coming in with sepsis due to multifocal pneumonia and acute hypoxic respiratory failure.  History of Present Illness:  Patient is encephalopathic so most of the history is taken from medical records 87 year old female with COPD, diabetes and chronic systolic and diastolic congestive heart failure who was brought into the emergency department from nursing home with a complaint of shortness of breath cough and altered mental status.  As per records patient was diagnosed with a pneumonia few days ago but unfortunately could not get antibiotics, got worse became short of breath when EMS was called her O2 sat was 79% on room air, she was placed on nonrebreather mask with improvement O2 sat 100% and she was brought to the emergency department.  Past Medical History:  COPD Diabetes type 2 Hyperlipidemia Hypertension Chronic systolic/diastolic congestive heart failure Hyperlipidemia  Significant Hospital Events:    Consults:  PCCM GI  Procedures:    Significant Diagnostic Tests:  1/8: X-ray chest: Consistent with multifocal pneumonia right more than left 1/10 barium swallow: 1. Smooth narrowing of the distal esophagus just above the GE junction. I was only able to distend this area up to about 0.7 cm. A 13 mm barium tablet initially impacted in this vicinity. Although I not see irregularity to suggest tumor, endoscopy to further evaluate this stricture and for potential therapy should be considered when the patient is suitably stable. 2. Mild distal esophageal fold thickening may reflect esophagitis.  Micro Data:  1/8 influenza/Covid PCR negative 1/8 blood cultures > 1/8 urine culture >  Antimicrobials:   Ceftriaxone 1/8 > Azithromycin 1/8 >  Interim History / Subjective:  1/10: called for re consult after resp arrest. No ACLS no CPR but intubated for hypoxia. Pt on 3L at baseline. Recently signed off 1/8. GI deferred endoscopy 1/9 2/2 resp status for esophageal dysmotility. Had barium swallow today with noted stricture found. cxr with noted barium retained in stomach.   Objective   Blood pressure 134/68, pulse (!) 105, temperature 98.1 F (36.7 C), temperature source Oral, resp. rate 18, height 5\' 4"  (1.626 m), weight 59 kg, SpO2 (!) 73 %.    FiO2 (%):  [100 %] 100 %   Intake/Output Summary (Last 24 hours) at 07/08/2020 1823 Last data filed at 07/08/2020 1600 Gross per 24 hour  Intake 1452.47 ml  Output -  Net 1452.47 ml   Filed Weights   07/13/2020 2318  Weight: 59 kg    Examination:   Physical exam: General: Chronically ill-appearing Caucasian female, on nonrebreather mask HEENT: [place]/AT, eyes anicteric.  Severely dry mucous membranes Neuro: Lethargic, opens eyes with vocal stimuli, following simple commands.  Moving all 4 extremities antigravity.  Chest: Crackles on the right side, bilateral wheezes all over, no rhonchi Heart: Tachycardic, regular rhythm, no murmurs or gallops Abdomen: Soft, nontender, nondistended, bowel sounds present Skin: No rash   Resolved Hospital Problem list     Assessment & Plan:  Acute hypoxic respiratory failure due to multifocal pneumonia Sepsis (present on admission) Chronic systolic/diastolic heart failure Acute metabolic encephalopathy Acute kidney injury, likely prerenal Hyperkalemia  Please give 1 more liter of IV fluid with LR, then continue maintenance fluid at 125 an hour for next 12 hours Send urine Legionella and pneumococcal antigen  Continue IV ceftriaxone and azithromycin Follow-up respiratory, urine and blood cultures Continue oxygen to maintain O2 sat 90 -95% Clinically patient does not look like an acute exacerbation of  CHF, in fact she looks severely dehydrated Monitor serum creatinine Closely monitor serum potassium and repeat once IV resuscitated   Currently patient does not meet criteria for admission to ICU, PCCM will sign off please call with question  Best practice (evaluated daily)  Diet: NPO, speech and swallow evaluation in the morning Pain/Anxiety/Delirium protocol (if indicated): N/A VAP protocol (if indicated): N/A DVT prophylaxis: Per primary team GI prophylaxis: Per primary team Glucose control: SSI Mobility: Bedrest for now Disposition: Per primary team  Goals of Care:  Last date of multidisciplinary goals of care discussion: attempted to reach son via phone  Family and staff present:  Summary of discussion:  Follow up goals of care discussion due:  Code Status: Full code  Labs   CBC: Recent Labs  Lab 07/17/2020 1455 07/21/2020 1855 07/07/20 0300 07/07/20 0510 07/08/20 0137  WBC 13.7*  --   --  13.4* 16.1*  NEUTROABS  --   --   --  11.4*  --   HGB 10.5* 10.9* 10.9* 10.0* 10.0*  HCT 34.6* 32.0* 32.0* 34.3* 33.7*  MCV 91.3  --   --  94.5 92.8  PLT 250  --   --  221 176    Basic Metabolic Panel: Recent Labs  Lab 07/27/2020 1455 07/02/2020 1855 07/07/20 0004 07/07/20 0300 07/07/20 0510 07/08/20 0137  NA 132* 133* 130* 133* 132* 128*  K 5.5* 5.2* 6.1* 5.9* 6.3* 6.0*  CL 93*  --  92*  --  97* 95*  CO2 28  --  28  --  23 20*  GLUCOSE 102*  --  143*  --  127* 149*  BUN 45*  --  45*  --  49* 63*  CREATININE 2.20*  --  2.20*  --  2.18* 2.22*  CALCIUM 9.5  --  8.8*  --  8.7* 8.3*  MG  --   --   --   --  2.1  --   PHOS  --   --   --   --  5.9*  --    GFR: Estimated Creatinine Clearance: 15.7 mL/min (A) (by C-G formula based on SCr of 2.22 mg/dL (H)). Recent Labs  Lab 07/14/2020 0003 07/26/2020 1455 07/20/2020 1555 07/07/20 0004 07/07/20 0155 07/07/20 0510 07/08/20 0137  PROCALCITON  --   --   --  0.43  --  0.37 0.32  WBC  --  13.7*  --   --   --  13.4* 16.1*   LATICACIDVEN 1.3  --  1.2  --  1.2  --   --     Liver Function Tests: Recent Labs  Lab 07/10/2020 1555 07/07/20 0510  AST 85* 86*  ALT 50* 61*  ALKPHOS 131* 150*  BILITOT 0.6 0.8  PROT 6.6 6.2*  ALBUMIN 3.2* 2.8*   No results for input(s): LIPASE, AMYLASE in the last 168 hours. No results for input(s): AMMONIA in the last 168 hours.  ABG    Component Value Date/Time   HCO3 29.1 (H) 07/07/2020 0300   TCO2 31 07/07/2020 0300   O2SAT 99.0 07/07/2020 0300     Coagulation Profile: Recent Labs  Lab 06/29/2020 1555  INR 1.1    Cardiac Enzymes: Recent Labs  Lab 07/07/20 0004  CKTOTAL 17*    HbA1C: Hgb A1c MFr Bld  Date/Time Value  Ref Range Status  07/07/2020 12:04 AM 5.3 4.8 - 5.6 % Final    Comment:    (NOTE) Pre diabetes:          5.7%-6.4%  Diabetes:              >6.4%  Glycemic control for   <7.0% adults with diabetes     CBG: Recent Labs  Lab 07/08/20 0046 07/08/20 0438 07/08/20 0626 07/08/20 1115 07/08/20 1615  GLUCAP 143* 125* 123* 117* 169*    Review of Systems:   Unable to obtain as patient is encephalopathic  Past Medical History:  She,  has a past medical history of COPD (chronic obstructive pulmonary disease) ([place]), DM2 (diabetes mellitus, type 2) ([place]), Exertional dyspnea, GERD (gastroesophageal reflux disease), HLD (hyperlipidemia), HTN (hypertension), and Pneumonia.   Surgical History:   Past Surgical History:  Procedure Laterality Date  . BRAIN SURGERY    . CHOLECYSTECTOMY    . L arm surgery    . TONSILLECTOMY    . TOTAL KNEE ARTHROPLASTY       Social History:   reports that she quit smoking about 10 years ago. She has a 30.00 pack-year smoking history. She does not have any smokeless tobacco history on file. She reports that she does not drink alcohol.   Family History:  Her family history includes Heart attack in an other family member; Heart disease in an other family member.   Allergies Allergies  Allergen Reactions   . Codeine Nausea And Vomiting  . Sulfa Antibiotics Nausea And Vomiting     Home Medications  Prior to Admission medications   Medication Sig Start Date End Date Taking? Authorizing Provider  acetaminophen (TYLENOL) 325 MG tablet Take 650 mg by mouth every 8 (eight) hours as needed (pain).   Yes [provider]  acetaminophen (TYLENOL) 500 MG tablet Take 500 mg by mouth 2 (two) times daily.   Yes [provider]  albuterol (VENTOLIN HFA) 108 (90 Base) MCG/ACT inhaler Inhale 2 puffs into the lungs every 4 (four) hours as needed for wheezing or shortness of breath.   Yes [provider]  amitriptyline (ELAVIL) 25 MG tablet Take 25 mg by mouth at bedtime.   Yes [provider]  Ascorbic Acid (VITAMIN C) 1000 MG tablet Take 1,000 mg by mouth 2 (two) times daily.   Yes [provider]  aspirin EC 81 MG tablet Take 81 mg by mouth daily. Swallow whole.   Yes [provider]  azithromycin (ZITHROMAX) 250 MG tablet Take 250-500 mg by mouth See admin instructions. Take 2 tablets (500 mg) by mouth 1st day, then take 1 tablet (250 mg) daily on days 2-5   Yes [provider]  benzonatate (TESSALON) 100 MG capsule Take 100 mg by mouth every 8 (eight) hours as needed for cough.   Yes [provider]  bethanechol (URECHOLINE) 10 MG tablet Take 10 mg by mouth 2 (two) times daily.   Yes [provider]  Calcium Carb-Cholecalciferol (CALCIUM 600+D) 600-800 MG-UNIT TABS Take 1 tablet by mouth 2 (two) times daily.   Yes [provider]  carvedilol (COREG) 3.125 MG tablet Take 3.125 mg by mouth 2 (two) times daily.   Yes [provider]  dicyclomine (BENTYL) 10 MG capsule Take 10 mg by mouth 2 (two) times daily.   Yes [provider]  Ensure (ENSURE) Take 237 mLs by mouth 3 (three) times daily.   Yes [provider]  ferrous sulfate 325 (  65 FE) MG tablet Take 325 mg by mouth 2 (two) times daily.   Yes  [provider]  fluticasone (FLONASE) 50 MCG/ACT nasal spray Place 1 spray into both nostrils daily.   Yes [provider]  furosemide (LASIX) 40 MG tablet Take 40 mg by mouth daily.   Yes [provider]  ipratropium (ATROVENT) 0.02 % nebulizer solution Take 0.5 mg by nebulization 3 (three) times daily.   Yes [provider]  levothyroxine (SYNTHROID) 25 MCG tablet Take 25 mcg by mouth daily before breakfast.   Yes [provider]  loratadine (CLARITIN) 10 MG tablet Take 10 mg by mouth daily.   Yes [provider]  LORazepam (ATIVAN) 0.5 MG tablet Take 0.5 mg by mouth every 6 (six) hours as needed for anxiety.   Yes [provider]  meclizine (ANTIVERT) 25 MG tablet Take 25 mg by mouth every 6 (six) hours as needed for dizziness.   Yes [provider]  metFORMIN (GLUCOPHAGE) 500 MG tablet Take 500 mg by mouth 2 (two) times daily.   Yes [provider]  mirabegron ER (MYRBETRIQ) 25 MG TB24 tablet Take 25 mg by mouth at bedtime.   Yes [provider]  montelukast (SINGULAIR) 10 MG tablet Take 10 mg by mouth at bedtime.   Yes [provider]  morphine (ROXANOL) 20 MG/ML concentrated solution Take 5 mg by mouth every 2 (two) hours as needed for severe pain or shortness of breath.   Yes [provider]  omeprazole (PRILOSEC) 40 MG capsule Take 40 mg by mouth daily before breakfast.   Yes [provider]  ondansetron (ZOFRAN) 4 MG tablet Take 4 mg by mouth every 6 (six) hours as needed for nausea or vomiting.   Yes [provider]  OXYGEN Inhale 3 L into the lungs continuous.   Yes [provider]  PARoxetine (PAXIL) 20 MG tablet Take 20 mg by mouth daily.   Yes [provider]  Polyethyl Glycol-Propyl Glycol (SYSTANE) 0.4-0.3 % SOLN Place 1 drop into both eyes every 12 (twelve) hours as needed (dry eyes).   Yes [provider]  potassium chloride SA  (KLOR-CON) 20 MEQ tablet Take 20 mEq by mouth daily as needed (with each dose of torsemide).   Yes [provider]  promethazine-dextromethorphan (PROMETHAZINE-DM) 6.25-15 MG/5ML syrup Take 5 mLs by mouth every 6 (six) hours as needed for cough.   Yes [provider]  senna-docusate (SENOKOT-S) 8.6-50 MG tablet Take 1 tablet by mouth every 12 (twelve) hours as needed (constipation).   Yes [provider]  tiZANidine (ZANAFLEX) 2 MG tablet Take 2 mg by mouth every 12 (twelve) hours as needed for muscle spasms.   Yes [provider]  torsemide (DEMADEX) 10 MG tablet Take 10 mg by mouth daily as needed (swelling).   Yes [provider]  traMADol (ULTRAM) 50 MG tablet Take 50 mg by mouth every 12 (twelve) hours as needed (chronic pain).   Yes [provider]    Critical care time: The patient is critically ill with multiple organ systems failure and requires high complexity decision making for assessment and support, frequent evaluation and titration of therapies, application of advanced monitoring technologies and extensive interpretation of multiple databases.  Critical care time 35 mins. This represents my time independent of the NPs time taking care of the pt. This is excluding procedures.    [facility] 07/08/2020, 6:23 PM

## 2020-07-08 NOTE — TOC Initial Note (Addendum)
Transition of Care [facility]) - Initial/Assessment Note    Patient Details  Name: Hannah [name] MRN: [id number] Date of Birth: Nov 24, 1931  Transition of Care [facility]) CM/SW Contact:    Vinie Sill, [place] Phone Number: 07/08/2020, 1:38 PM  Clinical Narrative:                  CSW spoke with patient's son,Rx,via phone call. CSW introduced self and explained role. Hannah, confirmed patient is from [facility] ([facility] in [place], has been there for about 5 years. He is uncertain of patient returning there at this time. He expressed concerns of patient not getting medication as needed. CSW advised will continue to follow and assist as needed. No other questions or concerns noted at this time.   Thurmond Butts, MSW, LCSW Clinical Social Worker   Expected Discharge Plan: Skilled Nursing Facility Barriers to Discharge: Continued Medical Work up (family unsuer if patient will return to [facility])   Patient Goals and CMS Choice        Expected Discharge Plan and Services Expected Discharge Plan: [place] In-house Referral: Clinical Social Work     Living arrangements for the past 2 months: [place]                                      Prior Living Arrangements/Services Living arrangements for the past 2 months: [place] Lives with:: Self Patient language and need for interpreter reviewed:: No              Criminal Activity/Legal Involvement Pertinent to Current Situation/Hospitalization: No - Comment as needed  Activities of Daily Living      Permission Sought/Granted Permission sought to share information with : Family Supports    Share Information with NAME: Hannah [name]     Permission granted to share info w Relationship: son  Permission granted to share info w Contact Information: [phone]  Emotional Assessment       Orientation: : Oriented to Self Alcohol / Substance Use: Not Applicable Psych  Involvement: No (comment)  Admission diagnosis:  Acute respiratory failure with hypoxia ([place]) [J96.01] Dysphagia [R13.10] Septic shock ([place]) [A41.9, R65.21] Multifocal pneumonia [J18.9] Patient Active Problem List   Diagnosis Date Noted  . COPD (chronic obstructive pulmonary disease) ([place]) 07/07/2020  . CAP (community acquired pneumonia) 07/07/2020  . Acute on chronic respiratory failure with hypoxia and hypercapnia ([place]) 07/07/2020  . Atrial fibrillation with RVR ([place]) 07/07/2020  . GERD (gastroesophageal reflux disease) 07/07/2020  . DM2 (diabetes mellitus, type 2) ([place]) 07/07/2020  . Essential hypertension 07/07/2020  . CKD (chronic kidney disease), stage III ([place]) 07/07/2020  . Type 2 diabetes mellitus with hyperlipidemia ([place]) 07/07/2020  . Dysphagia   . Acute respiratory failure with hypoxia ([place])   . Septic shock ([place]) 07/22/2020   PCP:  Street, [name], MD Pharmacy:  No Pharmacies Listed    Social Determinants of Health (SDOH) Interventions    Readmission Risk Interventions No flowsheet data found.

## 2020-07-08 NOTE — Plan of Care (Signed)
Plan of care initiated and progressing 

## 2020-07-08 NOTE — Progress Notes (Addendum)
Progress Note  Chief Complaint:    Dysphagia and abnormal barium swallow     ASSESSMENT / PLAN:    # 87 yo female with DM2, COPD on home 02, advanced dementia, GERD.  Admitted with SOB / acute on chronic respiratory failure / CAP    # Dysphagia, ? Hx of esophageal dilation a few years ago.  Barium swallow shows a smooth narrowing of distal esophagus just above GE junction. The area could only be distended to about 0.7 cm. Mild distal esophageal fold thickening possibly representing esophagitis.  --Patient will need EGD when stable from respiratory standpoint. She is at increase risk for procedures given acute on chronic respiratory failure  Was on nonrebreather last night, currently on 6 liter of 02 per [place]. She uses only 3 liter at home. --Seems to be tolerating liquid diet   # [place] anemia. Hgb stable at 10. Ferritin is 129. Though percent of iron sat is low ( 4%), TIBC is normal at 273 so may been anemia of chronic disease.   # Afib with RVR on admission, new . On heparin drip  # Mild elevation in liver tests, less than 2 x ULN, possibly medication related. . Mild elevation in alk phos. She has a history of chronically elevated liver enzymes dating back to 2017 [facility] Everywhere).         SUBJECTIVE:   No complaints.     OBJECTIVE:     Scheduled inpatient medications:  . amitriptyline  25 mg Oral QHS  . carvedilol  3.125 mg Oral BID  . insulin aspart  0-9 Units Subcutaneous Q4H  . ipratropium  0.5 mg Nebulization Q6H  . levothyroxine  25 mcg Oral QAC breakfast  . methylPREDNISolone (SOLU-MEDROL) injection  40 mg Intravenous Q12H  . pantoprazole (PROTONIX) IV  40 mg Intravenous Q12H  . sodium zirconium cyclosilicate  5 g Oral Once  . vancomycin variable dose per unstable renal function (pharmacist dosing)   Does not apply See admin instructions   Continuous inpatient infusions:  . azithromycin Stopped (07/07/20 1740)  . ceFEPime (MAXIPIME) IV 2 g (07/07/20 2225)  .  heparin 900 Units/hr (07/08/20 0238)   PRN inpatient medications: acetaminophen **OR** acetaminophen, albuterol, HYDROcodone-acetaminophen, ipratropium-albuterol, metoprolol tartrate  Vital signs in last 24 hours: Temp:  [97.7 F (36.5 C)-98.8 F (37.1 C)] 98.1 F (36.7 C) (01/10 1215) Pulse Rate:  [47-134] 126 (01/10 1215) Resp:  [14-27] 18 (01/10 1215) BP: (97-140)/(58-98) 137/86 (01/10 1215) SpO2:  [92 %-100 %] 95 % (01/10 1215) FiO2 (%):  [100 %] 100 % (01/10 0916) Last BM Date:  (PTA)  Intake/Output Summary (Last 24 hours) at 07/08/2020 1447 Last data filed at 07/08/2020 0500 Gross per 24 hour  Intake 1353.64 ml  Output -  Net 1353.64 ml     Physical Exam:  . General: Alert female in NAD . Heart:  Regular.  No lower extremity edema . Pulmonary: Bilateral crackles, scattered wheezes.  . Abdomen: Soft, nondistended, nontender. Normal bowel sounds.  . Psych: Pleasant. Cooperative.   Filed Weights   07/19/2020 2318  Weight: 59 kg    Intake/Output from previous day: 01/09 0701 - 01/10 0700 In: 1353.6 [I.V.:1253.6; IV Piggyback:100] Out: -  Intake/Output this shift: No intake/output data recorded.    Lab Results: Recent Labs    07/03/2020 1455 07/08/2020 1855 07/07/20 0300 07/07/20 0510 07/08/20 0137  WBC 13.7*  --   --  13.4* 16.1*  HGB 10.5*   < > 10.9*  10.0* 10.0*  HCT 34.6*   < > 32.0* 34.3* 33.7*  PLT 250  --   --  221 256   < > = values in this interval not displayed.   BMET Recent Labs    07/07/20 0004 07/07/20 0300 07/07/20 0510 07/08/20 0137  NA 130* 133* 132* 128*  K 6.1* 5.9* 6.3* 6.0*  CL 92*  --  97* 95*  CO2 28  --  23 20*  GLUCOSE 143*  --  127* 149*  BUN 45*  --  49* 63*  CREATININE 2.20*  --  2.18* 2.22*  CALCIUM 8.8*  --  8.7* 8.3*   LFT Recent Labs    07/03/2020 1555 07/07/20 0510  PROT 6.6 6.2*  ALBUMIN 3.2* 2.8*  AST 85* 86*  ALT 50* 61*  ALKPHOS 131* 150*  BILITOT 0.6 0.8  BILIDIR 0.1  --   IBILI 0.5  --     PT/INR Recent Labs    07/28/2020 1555  LABPROT 14.2  INR 1.1   Hepatitis Panel No results for input(s): HEPBSAG, HCVAB, HEPAIGM, HEPBIGM in the last 72 hours.  ECHOCARDIOGRAM COMPLETE  Result Date: 07/08/2020    ECHOCARDIOGRAM REPORT   Patient Name:   Hannah [name] Date of Exam: 07/08/2020 Medical Rec #:  [id number]     Height:       64.0 in Accession #:    [id number]    Weight:       130.0 lb Date of Birth:  23-Dec-1931      BSA:          1.629 m Patient Age:    61 years      BP:           122/78 mmHg Patient Gender: F             HR:           112 bpm. Exam Location:  Inpatient Procedure: 2D Echo, Cardiac Doppler and Color Doppler Indications:    Atrial fibrillation  History:        Patient has no prior history of Echocardiogram examinations.                 CHF, COPD; Risk Factors:Diabetes, Hypertension and Dyslipidemia.                 GERD.  Sonographer:    Cl[place]ton Lefort RDCS (AE) Referring Phys: [place]  1. Left ventricular ejection fraction, by estimation, is 50 to 55%. The left ventricle has low normal function. The left ventricle demonstrates global hypokinesis. There is moderate concentric left ventricular hypertrophy. Left ventricular diastolic function could not be evaluated.  2. Right ventricular systolic function is mildly reduced. The right ventricular size is not well visualized. There is moderately elevated pulmonary artery systolic pressure. The estimated right ventricular systolic pressure is 03.5 mmHg.  3. Left atrial size was mild to moderately dilated.  4. The mitral valve is degenerative. Moderate mitral valve regurgitation. No evidence of mitral stenosis. Moderate mitral annular calcification.  5. The aortic valve is tricuspid. There is severe calcifcation of the aortic valve. There is moderate thickening of the aortic valve. The non coronary cusp is immobile. Aortic valve regurgitation is mild. Mild to moderate aortic valve stenosis. Aortic valve area, by  VTI measures 1.23 cm. Aortic valve mean gradient measures 9.8 mmHg. Aortic valve Vmax measures 1.98 m/s. The DI is 0.54. The AVA is underestimated due to small LVOT measurement but likely more  than mild AS as the SVI is low at 26. Findings consistent with low flow low gradient Aortic stenosis.  6. The inferior vena cava is dilated in size with <50% respiratory variability, suggesting right atrial pressure of 15 mmHg.  7. Trivial pericardial effusion anterior to the right ventricle. FINDINGS  Left Ventricle: Left ventricular ejection fraction, by estimation, is 50 to 55%. The left ventricle has low normal function. The left ventricle demonstrates global hypokinesis. The left ventricular internal cavity size was normal in size. There is moderate concentric left ventricular hypertrophy. Left ventricular diastolic function could not be evaluated due to atrial fibrillation. Left ventricular diastolic function could not be evaluated. Right Ventricle: The right ventricular size is not well visualized. Right vetricular wall thickness was not well visualized. Right ventricular systolic function is mildly reduced. There is moderately elevated pulmonary artery systolic pressure. The tricuspid regurgitant velocity is 3.12 m/s, and with an assumed right atrial pressure of 15 mmHg, the estimated right ventricular systolic pressure is 63.8 mmHg. Left Atrium: Left atrial size was mild to moderately dilated. Right Atrium: Right atrial size was normal in size. Pericardium: Trivial pericardial effusion is present. The pericardial effusion is anterior to the right ventricle. Presence of pericardial fat pad. Mitral Valve: The mitral valve is degenerative in appearance. There is mild thickening of the anterior mitral valve leaflet(s). There is mild calcification of the anterior mitral valve leaflet(s). Moderate mitral annular calcification. Moderate mitral valve regurgitation. No evidence of mitral valve stenosis. Tricuspid Valve: The  tricuspid valve is normal in structure. Tricuspid valve regurgitation is mild . No evidence of tricuspid stenosis. Aortic Valve: The aortic valve is tricuspid. There is severe calcifcation of the aortic valve. There is moderate thickening of the aortic valve. Aortic valve regurgitation is mild. Mild to moderate aortic stenosis is present. Aortic valve mean gradient measures 9.8 mmHg. Aortic valve peak gradient measures 15.6 mmHg. Aortic valve area, by VTI measures 1.23 cm. Pulmonic Valve: The pulmonic valve was normal in structure. Pulmonic valve regurgitation is not visualized. No evidence of pulmonic stenosis. Aorta: The aortic root is normal in size and structure. Venous: The inferior vena cava is dilated in size with less than 50% respiratory variability, suggesting right atrial pressure of 15 mmHg. IAS/Shunts: No atrial level shunt detected by color flow Doppler.  LEFT VENTRICLE PLAX 2D LVIDd:         2.90 cm LVIDs:         2.40 cm LV PW:         1.40 cm LV IVS:        1.60 cm LVOT diam:     1.70 cm LV SV:         42 LV SV Index:   26 LVOT Area:     2.27 cm  LV Volumes (MOD) LV vol d, MOD A2C: 43.7 ml LV vol d, MOD A4C: 59.4 ml LV vol s, MOD A2C: 27.1 ml LV vol s, MOD A4C: 35.5 ml LV SV MOD A2C:     16.6 ml LV SV MOD A4C:     59.4 ml LV SV MOD BP:      21.4 ml RIGHT VENTRICLE            IVC RV Basal diam:  2.70 cm    IVC diam: 2.10 cm RV S prime:     5.87 cm/s TAPSE (M-mode): 0.9 cm LEFT ATRIUM             Index  RIGHT ATRIUM           Index LA diam:        4.30 cm 2.64 cm/m  RA Area:     14.90 cm LA Vol (A2C):   52.1 ml 31.98 ml/m RA Volume:   33.30 ml  20.44 ml/m LA Vol (A4C):   74.2 ml 45.55 ml/m LA Biplane Vol: 66.6 ml 40.88 ml/m  AORTIC VALVE AV Area (Vmax):    1.10 cm AV Area (Vmean):   1.07 cm AV Area (VTI):     1.23 cm AV Vmax:           197.80 cm/s AV Vmean:          149.200 cm/s AV VTI:            0.341 m AV Peak Grad:      15.6 mmHg AV Mean Grad:      9.8 mmHg LVOT Vmax:         95.56  cm/s LVOT Vmean:        70.260 cm/s LVOT VTI:          0.185 m LVOT/AV VTI ratio: 0.54  AORTA Ao Root diam: 3.00 cm MR Peak grad: 102.4 mmHg  TRICUSPID VALVE MR Mean grad: 71.0 mmHg   TR Peak grad:   38.9 mmHg MR Vmax:      506.00 cm/s TR Vmax:        312.00 cm/s MR Vmean:     399.0 cm/s                           SHUNTS                           Systemic VTI:  0.18 m                           Systemic Diam: 1.70 cm Fransico Him MD Electronically signed by Fransico Him MD Signature Date/Time: 07/08/2020/10:46:00 AM    Final    DG ESOPHAGUS W SINGLE CM (SOL OR THIN BA)  Result Date: 07/08/2020 CLINICAL DATA:  Difficulty swallowing.  Hypoxia. EXAM: ESOPHAGUS/BARIUM SWALLOW/TABLET STUDY TECHNIQUE: Initial scout AP supine abdominal image obtained to insure adequate colon cleansing. Barium was introduced into the colon in a retrograde fashion and refluxed from the rectum to the cecum. Spot images of the colon followed by overhead radiographs were obtained. FLUOROSCOPY TIME:  Fluoroscopy Time:  2 minutes, 6 seconds Radiation Exposure Index (if provided by the fluoroscopic device): 17.0 mGy Number of Acquired Spot Images: 0 COMPARISON:  Chest radiograph 07/01/2020 FINDINGS: The patient is infirm and today's examination was performed using thin barium with the patient in the LPO position. Right clavicular deformity compatible with healed fracture. Atherosclerotic calcification of the aortic arch noted. Primary peristaltic waves in the esophagus were intact on 4/4 swallows although there was secondary and tertiary contraction in the distal esophagus on most swallows. Mild distal esophageal fold thickening may reflect esophagitis. There is smooth narrowing of the distal esophagus just above the GE junction. I was only able to distend this area of smooth narrowing up to about 0.7 cm, for example on image 15 of series 3. Accordingly, I am suspicious for a smooth stricture. A 13 mm barium tablet initially impacted in the mid  to distal esophagus despite the lack of any narrowing/stricture in this vicinity, but later extended  to the distal esophagus beyond which I was not able to further advance the tablet with swallows of water. IMPRESSION: 1. Smooth narrowing of the distal esophagus just above the GE junction. I was only able to distend this area up to about 0.7 cm. A 13 mm barium tablet initially impacted in this vicinity. Although I not see irregularity to suggest tumor, endoscopy to further evaluate this stricture and for potential therapy should be considered when the patient is suitably stable. 2. Mild distal esophageal fold thickening may reflect esophagitis. Electronically Signed   By: Van Clines M.D.   On: 07/08/2020 13:58     LOS: 2 days   Tye Savoy ,NP 07/08/2020, 2:47 PM    ________________________________________________________________________  Velora Heckler GI MD note:  I personally examined the patient, reviewed the data and agree with the assessment and plan described above.  She has dysphagia and somewhat abnormal barium esophagram and this should be further worked up once she is more stable from her pulmonary process. We will follow along   Owens Loffler, MD [facility] Pager [phone]

## 2020-07-08 NOTE — Progress Notes (Signed)
Responded t Code. Family was called Son- Rex Griffin-Ron-AC called him. Son said call back later-did not sound as if he would be coming up to hospital.  Patient was moved to 2 Heart -09 Chaplain not able to see patient due to all that was going on.   07/08/20 1800  Clinical Encounter Type  Visited With Patient not available  Visit Type Code  Referral From Physician  Consult/Referral To [place], Chaplain

## 2020-07-08 NOTE — Progress Notes (Incomplete)
Progress Note  Chief Complaint:  Dysphagia    ASSESSMENT / PLAN:    Patient is an 87 yo female with medical history significant of DM2, GERD, HLD, HTN, dementia, and COPD on 3L oxygen at baseline.  She is a nursing home resident and is wheelchair bound.  She presents with a 5 day course of shortness of breath, cough, and fever.  Admitted with septic shock, acute respiratory failure/pneumonia.  Endorses chronic dysphagia.  Per patient's son, patient had an EGD done about 5 years ago for a dilation of a stricture.  Chronic dysphagia -Evaluated by Speech Therapy.  Currently tolerating full liquid diet. Per  SLP, moderate aspiration risk with probable esophageal dysphagia. -Barium swallow shows narrowing of esophagus.  She will need EGD with probable dilation when stable from a respiratory standpoint.   #GERD -Continue protonix 40 mg BID IV   #Elevated LFT's, mild and chronic.  Abnormalities can be secondary to medication.  #Chronic normocytic anemia, stable  -Hgb 10.5, Ferritin 129, TIBC 273, percent saturation only 4 %.  Could be iron deficiency, but also anemia of chronic disease.  #CAP with acute respiratory failure with hypoxia and hypercapnia -CXR showing hazy pulmonary infiltrates Right >Left -Initially hypotensive requiring levophed, weaned off  #COPDexacerbation -On IV Solu-Medrol 40 mg BID and bronchodilators.    #Atrial Fibrillation with RVR -On heparin gtt with long term acute care plan  #Dementia -Stable, chronic     SUBJECTIVE:   Patient seen at bedside.  Patient is alert to self and reports having issues with swallowing and getting food down.  She reports having increased oxygen needs.    OBJECTIVE:    Scheduled inpatient medications:  . amitriptyline  25 mg Oral QHS  . carvedilol  3.125 mg Oral BID  . insulin aspart  0-9 Units Subcutaneous Q4H  . ipratropium  0.5 mg Nebulization Q6H  . levothyroxine  25 mcg Oral QAC breakfast  . methylPREDNISolone  (SOLU-MEDROL) injection  40 mg Intravenous Q12H  . pantoprazole (PROTONIX) IV  40 mg Intravenous Q12H  . sodium zirconium cyclosilicate  5 g Oral Once  . vancomycin variable dose per unstable renal function (pharmacist dosing)   Does not apply See admin instructions   Continuous inpatient infusions:  . azithromycin Stopped (07/07/20 1740)  . ceFEPime (MAXIPIME) IV 2 g (07/07/20 2225)  . heparin 900 Units/hr (07/08/20 0238)   PRN inpatient medications: acetaminophen **OR** acetaminophen, albuterol, HYDROcodone-acetaminophen, ipratropium-albuterol, metoprolol tartrate  Vital signs in last 24 hours: Temp:  [97.7 F (36.5 C)-98.8 F (37.1 C)] 98.1 F (36.7 C) (01/10 1215) Pulse Rate:  [47-134] 126 (01/10 1215) Resp:  [14-27] 18 (01/10 1215) BP: (97-140)/(58-98) 137/86 (01/10 1215) SpO2:  [92 %-100 %] 95 % (01/10 1215) FiO2 (%):  [100 %] 100 % (01/10 0916) Last BM Date:  (PTA)  Intake/Output Summary (Last 24 hours) at 07/08/2020 1448 Last data filed at 07/08/2020 0500 Gross per 24 hour  Intake 1353.64 ml  Output -  Net 1353.64 ml     Physical Exam:  . General: Alert female in NAD . Heart:  Regular rate and rhythm. No lower extremity edema . Pulmonary: Requires 6 L oxygen, bilateral crackles. . Abdomen: Soft, nondistended, nontender. Normal bowel sounds.  . Neurologic: Alert and oriented . Psych: Pleasant. Cooperative.   Filed Weights   07/23/2020 2318  Weight: 59 kg    Intake/Output from previous day: 01/09 0701 - 01/10 0700 In: 1353.6 [I.V.:1253.6; IV Piggyback:100] Out: -  Intake/Output this shift: No intake/output  data recorded.    Lab Results: Recent Labs    07/22/2020 1455 07/24/2020 1855 07/07/20 0300 07/07/20 0510 07/08/20 0137  WBC 13.7*  --   --  13.4* 16.1*  HGB 10.5*   < > 10.9* 10.0* 10.0*  HCT 34.6*   < > 32.0* 34.3* 33.7*  PLT 250  --   --  221 256   < > = values in this interval not displayed.   BMET Recent Labs    07/07/20 0004 07/07/20 0300  07/07/20 0510 07/08/20 0137  NA 130* 133* 132* 128*  K 6.1* 5.9* 6.3* 6.0*  CL 92*  --  97* 95*  CO2 28  --  23 20*  GLUCOSE 143*  --  127* 149*  BUN 45*  --  49* 63*  CREATININE 2.20*  --  2.18* 2.22*  CALCIUM 8.8*  --  8.7* 8.3*   LFT Recent Labs    07/24/2020 1555 07/07/20 0510  PROT 6.6 6.2*  ALBUMIN 3.2* 2.8*  AST 85* 86*  ALT 50* 61*  ALKPHOS 131* 150*  BILITOT 0.6 0.8  BILIDIR 0.1  --   IBILI 0.5  --    PT/INR Recent Labs    07/04/2020 1555  LABPROT 14.2  INR 1.1   Hepatitis Panel No results for input(s): HEPBSAG, HCVAB, HEPAIGM, HEPBIGM in the last 72 hours.  ECHOCARDIOGRAM COMPLETE  Result Date: 07/08/2020    ECHOCARDIOGRAM REPORT   Patient Name:   DARIN SAUVE Date of Exam: 07/08/2020 Medical Rec #:  [id number]     Height:       64.0 in Accession #:    [id number]    Weight:       130.0 lb Date of Birth:  July 24, 1931      BSA:          1.629 m Patient Age:    3 years      BP:           122/78 mmHg Patient Gender: F             HR:           112 bpm. Exam Location:  Inpatient Procedure: 2D Echo, Cardiac Doppler and Color Doppler Indications:    Atrial fibrillation  History:        Patient has no prior history of Echocardiogram examinations.                 CHF, COPD; Risk Factors:Diabetes, Hypertension and Dyslipidemia.                 GERD.  Sonographer:    Clayton Lefort RDCS (AE) Referring Phys: [place]  1. Left ventricular ejection fraction, by estimation, is 50 to 55%. The left ventricle has low normal function. The left ventricle demonstrates global hypokinesis. There is moderate concentric left ventricular hypertrophy. Left ventricular diastolic function could not be evaluated.  2. Right ventricular systolic function is mildly reduced. The right ventricular size is not well visualized. There is moderately elevated pulmonary artery systolic pressure. The estimated right ventricular systolic pressure is [id number].  3. Left atrial size was mild to  moderately dilated.  4. The mitral valve is degenerative. Moderate mitral valve regurgitation. No evidence of mitral stenosis. Moderate mitral annular calcification.  5. The aortic valve is tricuspid. There is severe calcifcation of the aortic valve. There is moderate thickening of the aortic valve. The non coronary cusp is immobile. Aortic valve regurgitation is mild. Mild to  moderate aortic valve stenosis. Aortic valve area, by VTI measures 1.23 cm. Aortic valve mean gradient measures 9.8 mmHg. Aortic valve Vmax measures 1.98 m/s. The DI is 0.54. The AVA is underestimated due to small LVOT measurement but likely more than mild AS as the SVI is low at 26. Findings consistent with low flow low gradient Aortic stenosis.  6. The inferior vena cava is dilated in size with <50% respiratory variability, suggesting right atrial pressure of 15 mmHg.  7. Trivial pericardial effusion anterior to the right ventricle. FINDINGS  Left Ventricle: Left ventricular ejection fraction, by estimation, is 50 to 55%. The left ventricle has low normal function. The left ventricle demonstrates global hypokinesis. The left ventricular internal cavity size was normal in size. There is moderate concentric left ventricular hypertrophy. Left ventricular diastolic function could not be evaluated due to atrial fibrillation. Left ventricular diastolic function could not be evaluated. Right Ventricle: The right ventricular size is not well visualized. Right vetricular wall thickness was not well visualized. Right ventricular systolic function is mildly reduced. There is moderately elevated pulmonary artery systolic pressure. The tricuspid regurgitant velocity is 3.12 m/s, and with an assumed right atrial pressure of 15 mmHg, the estimated right ventricular systolic pressure is 57.3 mmHg. Left Atrium: Left atrial size was mild to moderately dilated. Right Atrium: Right atrial size was normal in size. Pericardium: Trivial pericardial effusion is  present. The pericardial effusion is anterior to the right ventricle. Presence of pericardial fat pad. Mitral Valve: The mitral valve is degenerative in appearance. There is mild thickening of the anterior mitral valve leaflet(s). There is mild calcification of the anterior mitral valve leaflet(s). Moderate mitral annular calcification. Moderate mitral valve regurgitation. No evidence of mitral valve stenosis. Tricuspid Valve: The tricuspid valve is normal in structure. Tricuspid valve regurgitation is mild . No evidence of tricuspid stenosis. Aortic Valve: The aortic valve is tricuspid. There is severe calcifcation of the aortic valve. There is moderate thickening of the aortic valve. Aortic valve regurgitation is mild. Mild to moderate aortic stenosis is present. Aortic valve mean gradient measures 9.8 mmHg. Aortic valve peak gradient measures 15.6 mmHg. Aortic valve area, by VTI measures 1.23 cm. Pulmonic Valve: The pulmonic valve was normal in structure. Pulmonic valve regurgitation is not visualized. No evidence of pulmonic stenosis. Aorta: The aortic root is normal in size and structure. Venous: The inferior vena cava is dilated in size with less than 50% respiratory variability, suggesting right atrial pressure of 15 mmHg. IAS/Shunts: No atrial level shunt detected by color flow Doppler.  LEFT VENTRICLE PLAX 2D LVIDd:         2.90 cm LVIDs:         2.40 cm LV PW:         1.40 cm LV IVS:        1.60 cm LVOT diam:     1.70 cm LV SV:         42 LV SV Index:   26 LVOT Area:     2.27 cm  LV Volumes (MOD) LV vol d, MOD A2C: 43.7 ml LV vol d, MOD A4C: 59.4 ml LV vol s, MOD A2C: 27.1 ml LV vol s, MOD A4C: 35.5 ml LV SV MOD A2C:     16.6 ml LV SV MOD A4C:     59.4 ml LV SV MOD BP:      21.4 ml RIGHT VENTRICLE            IVC RV Basal diam:  2.70 cm    IVC diam: 2.10 cm RV S prime:     5.87 cm/s TAPSE (M-mode): 0.9 cm LEFT ATRIUM             Index       RIGHT ATRIUM           Index LA diam:        4.30 cm 2.64 cm/m   RA Area:     14.90 cm LA Vol (A2C):   52.1 ml 31.98 ml/m RA Volume:   33.30 ml  20.44 ml/m LA Vol (A4C):   74.2 ml 45.55 ml/m LA Biplane Vol: 66.6 ml 40.88 ml/m  AORTIC VALVE AV Area (Vmax):    1.10 cm AV Area (Vmean):   1.07 cm AV Area (VTI):     1.23 cm AV Vmax:           197.80 cm/s AV Vmean:          149.200 cm/s AV VTI:            0.341 m AV Peak Grad:      15.6 mmHg AV Mean Grad:      9.8 mmHg LVOT Vmax:         95.56 cm/s LVOT Vmean:        70.260 cm/s LVOT VTI:          0.185 m LVOT/AV VTI ratio: 0.54  AORTA Ao Root diam: 3.00 cm MR Peak grad: 102.4 mmHg  TRICUSPID VALVE MR Mean grad: 71.0 mmHg   TR Peak grad:   38.9 mmHg MR Vmax:      506.00 cm/s TR Vmax:        312.00 cm/s MR Vmean:     399.0 cm/s                           SHUNTS                           Systemic VTI:  0.18 m                           Systemic Diam: 1.70 cm Fransico Him MD Electronically signed by Fransico Him MD Signature Date/Time: 07/08/2020/10:46:00 AM    Final    DG ESOPHAGUS W SINGLE CM (SOL OR THIN BA)  Result Date: 07/08/2020 CLINICAL DATA:  Difficulty swallowing.  Hypoxia. EXAM: ESOPHAGUS/BARIUM SWALLOW/TABLET STUDY TECHNIQUE: Initial scout AP supine abdominal image obtained to insure adequate colon cleansing. Barium was introduced into the colon in a retrograde fashion and refluxed from the rectum to the cecum. Spot images of the colon followed by overhead radiographs were obtained. FLUOROSCOPY TIME:  Fluoroscopy Time:  2 minutes, 6 seconds Radiation Exposure Index (if provided by the fluoroscopic device): 17.0 mGy Number of Acquired Spot Images: 0 COMPARISON:  Chest radiograph 07/22/2020 FINDINGS: The patient is infirm and today's examination was performed using thin barium with the patient in the LPO position. Right clavicular deformity compatible with healed fracture. Atherosclerotic calcification of the aortic arch noted. Primary peristaltic waves in the esophagus were intact on 4/4 swallows although there was  secondary and tertiary contraction in the distal esophagus on most swallows. Mild distal esophageal fold thickening may reflect esophagitis. There is smooth narrowing of the distal esophagus just above the GE junction. I was only able to distend this area of smooth narrowing up to about  0.7 cm, for example on image 15 of series 3. Accordingly, I am suspicious for a smooth stricture. A 13 mm barium tablet initially impacted in the mid to distal esophagus despite the lack of any narrowing/stricture in this vicinity, but later extended to the distal esophagus beyond which I was not able to further advance the tablet with swallows of water. IMPRESSION: 1. Smooth narrowing of the distal esophagus just above the GE junction. I was only able to distend this area up to about 0.7 cm. A 13 mm barium tablet initially impacted in this vicinity. Although I not see irregularity to suggest tumor, endoscopy to further evaluate this stricture and for potential therapy should be considered when the patient is suitably stable. 2. Mild distal esophageal fold thickening may reflect esophagitis. Electronically Signed   By: Van Clines M.D.   On: 07/08/2020 13:58      Active Problems:   Septic shock (HCC)   COPD (chronic obstructive pulmonary disease) (HCC)   CAP (community acquired pneumonia)   Acute on chronic respiratory failure with hypoxia and hypercapnia (HCC)   Atrial fibrillation with RVR (HCC)   GERD (gastroesophageal reflux disease)   DM2 (diabetes mellitus, type 2) (HCC)   Essential hypertension   CKD (chronic kidney disease), stage III (HCC)   Type 2 diabetes mellitus with hyperlipidemia ([place])     LOS: 2 days   Luetta Nutting, RN, NP-student 07/08/2020, 2:48 PM

## 2020-07-08 NOTE — Progress Notes (Signed)
PROGRESS NOTE  Hannah [name]  DOB: 1932-05-20  PCP: Emmaline Kluver, MD TTS:[id number]  DOA: 07/23/2020  LOS: 2 days   Chief Complaint  Patient presents with  . Shortness of Breath    Brief narrative: Hannah [name] is a 87 y.o. female with PMH significant for DM2, HTN, HLD, chronic systolic diastolic CHF, COPD on 3 L at baseline, GERD, dementia.  She is a nursing home resident, baseline wheelchair-bound and has only mild dementia. Patient was sent to the ED from SNF on 1/8 for complaint of shortness of breath, cough and ultimately status.  She started having symptoms 5 days ago but could not get started on antibiotics until 1/8.  EMS was called.  She was noted to have oxygen saturation low at 79%.  She was brought to the ED on nonrebreather mask.  In the ED, chest x-ray showed hazy pulmonary infiltrates, right more than left. She was initially hypotensive and briefly required Levophed. Patient was admitted to hospitalist service. Critical care consultation was obtained.  Subjective: Patient was seen and examined this morning. Propped up in bed.  Not in distress.  No new symptoms.  Daughter at bedside. Patient is pending swallow eval this afternoon.  Assessment/Plan: Septic shock - POA healthcare associated pneumonia -Presented with 5 days history of shortness of breath, cough,  -On presentation had low blood pressure requiring pressors, leukocytosis, elevated procalcitonin, elevated creatinine -Chest x-ray with bilateral hazy pulmonary infiltrates. -Currently on IV cefepime, IV vancomycin -Follow-up culture reports. -No fever but WBC count is trending up, 16.1 today.  Probably because of steroids. Recent Labs  Lab 07/08/2020 0003 07/04/2020 1455 07/17/2020 1555 07/07/20 0004 07/07/20 0155 07/07/20 0510 07/08/20 0137  WBC  --  13.7*  --   --   --  13.4* 16.1*  LATICACIDVEN 1.3  --  1.2  --  1.2  --   --   PROCALCITON  --   --   --  0.43  --  0.37 0.32   COPD  exacerbation -On IV Solu-Medrol 40 mg twice daily and bronchodilators.    Acute on chronic respiratory failure with hypoxia and hypercapnia -At baseline on 3 L oxygen by nasal cannula. Initially required higher flow. Wean down as tolerated.    Chronic dysphagia/chronic aspiration -Speech therapy evaluation obtained. Moderate aspiration risk. Per speech therapy, patient probably has esophageal dysphagia.  She reported that she felt her food and liquids would not go down.  Per family, she has history of esophageal stricture requiring dilation in the past.   -GI consultation called.  Plan for barium swallow study today.  May need EGD inpatient versus outpatient.  history of CHF  Essential hypertension -Echo with EF 50 to 55%, moderate concentric LVH.   -Clinically dry because of n.p.o. status.  Was hydrated earlier.  Fluid on hold at this time to avoid CHF exacerbation.  Atrial fibrillation with RVR  -I do not see any history of A. Fib.  -EKG on admission showed A. fib. Her heart rate is currently elevated to 130s. -Patient was started on metoprolol 12.5 mg twice daily but could not get it this morning because of choking episode.  -Patient was started on heparin drip on admission. Continue the same.    CKD 5 -Probably at baseline [facility] Recent Labs    07/20/2020 1455 07/07/20 0004 07/07/20 0510 07/08/20 0137  BUN 45* 45* 49* 63*  CREATININE 2.20* 2.20* 2.18* 2.22*   Type 2 diabetes mellitus  -A1c 5.3 on 1/9. -Continue  sliding scale with Accu-Cheks. Recent Labs  Lab 07/07/20 1658 07/08/20 0046 07/08/20 0438 07/08/20 0626 07/08/20 1115  GLUCAP 140* 143* 125* 123* 117*   Hyperlipidemia  -Continue statin.   Hyperkalemia  -Unclear cause.  Given a dose of Kayexalate yesterday after which it improved from 6.3-6 today.  Lokelma given this morning. -ACE inhibitor is on hold. -Continue to monitor. Recent Labs  Lab 07/28/2020 1455 07/19/2020 1855 07/07/20 0004 07/07/20 0300  07/07/20 0510 07/08/20 0137  K 5.5* 5.2* 6.1* 5.9* 6.3* 6.0*   Elevated LFT -Unclear cause.  Stable Recent Labs  Lab 07/25/2020 1555 07/07/20 0510  AST 85* 86*  ALT 50* 61*  ALKPHOS 131* 150*  BILITOT 0.6 0.8  PROT 6.6 6.2*  ALBUMIN 3.2* 2.8*   GERD -chronic stable continue PPI  Home meds include Aspirin 81 mg daily, Coreg 3.125 mg twice daily, Demadex, ?lasix Metformin 5 mg daily, Elavil 25 mg at bedtime Tessalon Perles, Flonase, Atrovent, Singulair, Iron sulfate, Prilosec 40 mg daily, Synthroid 25 mcg daily, Ativan 0.5 mg 3 times daily as needed, paroxetine 20 mg daily, Zanaflex as needed, tramadol 50 mg twice daily as needed. Myrbetriq 25 mg at bedtime, Potassium chloride,   Mobility: PT eval Code Status:   Code Status: Full Code  Nutritional status: Body mass index is 22.31 kg/m.     Diet Order            Diet full liquid Room service appropriate? Yes; Fluid consistency: Thin  Diet effective now                 DVT prophylaxis: SCDs   Antimicrobials:  Azithromycin Fluid: None at this time.  He failed swallow eval, he may need some IV hydration. Consultants: GI Family Communication:  None at bedside  Status is: Inpatient  Remains inpatient appropriate because: On IV antibiotics, needs GI evaluation   Dispo: The patient is from: SNF              Anticipated d/c is to: SNF              Anticipated d/c date is: 3 days              Patient currently is not medically stable to d/c.       Infusions:  . azithromycin Stopped (07/07/20 1740)  . ceFEPime (MAXIPIME) IV 2 g (07/07/20 2225)  . heparin 900 Units/hr (07/08/20 0238)    Scheduled Meds: . amitriptyline  25 mg Oral QHS  . carvedilol  3.125 mg Oral BID  . insulin aspart  0-9 Units Subcutaneous Q4H  . ipratropium  0.5 mg Nebulization Q6H  . levothyroxine  25 mcg Oral QAC breakfast  . methylPREDNISolone (SOLU-MEDROL) injection  40 mg Intravenous Q12H  . pantoprazole (PROTONIX) IV  40  mg Intravenous Q12H  . sodium zirconium cyclosilicate  5 g Oral Once  . vancomycin variable dose per unstable renal function (pharmacist dosing)   Does not apply See admin instructions    Antimicrobials: Anti-infectives (From admission, onward)   Start     Dose/Rate Route Frequency Ordered Stop   07/07/20 2300  ceFEPIme (MAXIPIME) 2 g in sodium chloride 0.9 % 100 mL IVPB        2 g 200 mL/hr over 30 Minutes Intravenous Every 24 hours 07/25/2020 2331     06/30/2020 2331  vancomycin variable dose per unstable renal function (pharmacist dosing)         Does not apply See admin instructions 07/01/2020 2331  07-27-2020 2330  vancomycin (VANCOREADY) IVPB 1250 mg/250 mL        1,250 mg 166.7 mL/hr over 90 Minutes Intravenous  Once 07-27-20 2325 07/07/20 0317   2020/07/27 2330  ceFEPIme (MAXIPIME) 2 g in sodium chloride 0.9 % 100 mL IVPB        2 g 200 mL/hr over 30 Minutes Intravenous  Once 2020/07/27 2325 07/07/20 0024   07-27-20 1600  cefTRIAXone (ROCEPHIN) 2 g in sodium chloride 0.9 % 100 mL IVPB  Status:  Discontinued        2 g 200 mL/hr over 30 Minutes Intravenous Every 24 hours July 27, 2020 1557 July 27, 2020 2325   07-27-20 1600  azithromycin (ZITHROMAX) 500 mg in sodium chloride 0.9 % 250 mL IVPB        500 mg 250 mL/hr over 60 Minutes Intravenous Every 24 hours 2020-07-27 1557        PRN meds: acetaminophen **OR** acetaminophen, albuterol, HYDROcodone-acetaminophen, ipratropium-albuterol, metoprolol tartrate   Objective: Vitals:   07/08/20 1008 07/08/20 1215  BP:  137/86  Pulse:  (!) 126  Resp:  18  Temp:  98.1 F (36.7 C)  SpO2: 100% 95%    Intake/Output Summary (Last 24 hours) at 07/08/2020 1324 Last data filed at 07/08/2020 0500 Gross per 24 hour  Intake 1353.64 ml  Output --  Net 1353.64 ml   Filed Weights   July 27, 2020 2318  Weight: 59 kg   Weight change:  Body mass index is 22.31 kg/m.   Physical Exam: General exam: Pleasant, elderly female, not in distress Skin: No rashes,  lesions or ulcers. HEENT: Atraumatic, normocephalic, no obvious bleeding Lungs: Diminished air entry in both bases CVS: Tachycardic, irregular, no murmur GI/Abd soft, nontender, nondistended, bowel sound present CNS: Alert, awake, oriented to place Psychiatry: Mood appropriate Extremities: No pedal edema, no calf tenderness  Data Review: I have personally reviewed the laboratory data and studies available.  Recent Labs  Lab 07-27-2020 1455 27-Jul-2020 1855 07/07/20 0300 07/07/20 0510 07/08/20 0137  WBC 13.7*  --   --  13.4* 16.1*  NEUTROABS  --   --   --  11.4*  --   HGB 10.5* 10.9* 10.9* 10.0* 10.0*  HCT 34.6* 32.0* 32.0* 34.3* 33.7*  MCV 91.3  --   --  94.5 92.8  PLT 250  --   --  221 256   Recent Labs  Lab 07/27/2020 1455 2020/07/27 1855 07/07/20 0004 07/07/20 0300 07/07/20 0510 07/08/20 0137  NA 132* 133* 130* 133* 132* 128*  K 5.5* 5.2* 6.1* 5.9* 6.3* 6.0*  CL 93*  --  92*  --  97* 95*  CO2 28  --  28  --  23 20*  GLUCOSE 102*  --  143*  --  127* 149*  BUN 45*  --  45*  --  49* 63*  CREATININE 2.20*  --  2.20*  --  2.18* 2.22*  CALCIUM 9.5  --  8.8*  --  8.7* 8.3*  MG  --   --   --   --  2.1  --   PHOS  --   --   --   --  5.9*  --     F/u labs ordered  Signed, Terrilee Croak, MD [facility] 07/08/2020

## 2020-07-08 NOTE — Progress Notes (Signed)
Bond for heparin Indication: atrial fibrillation  Allergies  Allergen Reactions  . Codeine Nausea And Vomiting  . Sulfa Antibiotics Nausea And Vomiting    Patient Measurements: Height: 5\' 4"  (162.6 cm) Weight: 59 kg (130 lb) IBW/kg (Calculated) : 54.7  Vital Signs: Temp: 98.1 F (36.7 C) (01/10 0916) Temp Source: Oral (01/10 0916) BP: 110/72 (01/10 0916) Pulse Rate: 121 (01/10 0916)  Labs: Recent Labs    07/22/2020 1455 07/26/2020 1555 07/03/2020 1855 07/07/20 0004 07/07/20 0300 07/07/20 0510 07/07/20 1129 07/08/20 0137  HGB 10.5*  --    < >  --  10.9* 10.0*  --  10.0*  HCT 34.6*  --    < >  --  32.0* 34.3*  --  33.7*  PLT 250  --   --   --   --  221  --  256  APTT  --  30  --   --   --   --   --   --   LABPROT  --  14.2  --   --   --   --   --   --   INR  --  1.1  --   --   --   --   --   --   HEPARINUNFRC  --   --   --   --   --   --  0.39 0.42  CREATININE 2.20*  --   --  2.20*  --  2.18*  --  2.22*  CKTOTAL  --   --   --  17*  --   --   --   --    < > = values in this interval not displayed.    Estimated Creatinine Clearance: 15.7 mL/min (A) (by C-G formula based on SCr of 2.22 mg/dL (H)).  Assessment: 87yo female admitted w/ sepsis and found to be in Afib w/ RVR, to begin heparin.  CHA2DS2-VASc Score = 6  This indicates a 9.7% annual risk of stroke. The patient's score is based upon: CHF History: Yes HTN History: Yes Diabetes History: Yes Stroke History: No Vascular Disease History: No Age Score: 2 Gender Score: 1  Hep lvl within goal  Goal of Therapy:  Heparin level 0.3-0.7 units/ml Monitor platelets by anticoagulation protocol: Yes   Plan:  Continue heparin gtt at 900 units/hr Daily heparin level, CBC, s/s bleeding F/u long term [facility] plan  Barth Kirks, PharmD, BCPS, BCCCP Clinical Pharmacist  Please check AMION for all [place] numbers  07/08/2020 10:15 AM

## 2020-07-08 NOTE — Evaluation (Signed)
Physical Therapy Evaluation Patient Details Name: Hannah [name] MRN: [id number] DOB: Jul 27, 1931 Today's Date: 07/08/2020   History of Present Illness  Hannah [name] is a 87 y.o. female with medical history significant of DM2, COPD on 3L at baseline, GERD, HLD, HTN, dementia presenting to ED with 5d hx of SOB, cought, and fever. Recent PNA dx, started on antiobiotics.    Clinical Impression  Pt poor historian due to dementia and unsure of PLOF. Per chart pt w/c bound and from facility. Pt on 15lo2 via NRB with noted SOB, pts HR in 100s at rest and SPO2 at 91-93% at rest. With movement to EOB pt with increased WOB, HR up to 144bpm, and SpO2 dec to 78%. Pt modA for sup to sit and maxA to sit to stand. Pt was able to follow all commands despite confusion. Pt on 3Lo2 via [place] at baseline, now on 15L NRB mask. Once patients O2 and breathing stable suspect pt will be able to return to SNF and undergo therapy to return to PLOF. Acute PT to cont to follow.    Follow Up Recommendations SNF;Supervision/Assistance - 24 hour    Equipment Recommendations  None recommended by PT (suspect has DME from facility)    Recommendations for Other Services       Precautions / Restrictions Precautions Precautions: Fall Precaution Comments: 15L HFNC, poor pleth reading, watch SpO2 Restrictions Weight Bearing Restrictions: No      Mobility  Bed Mobility Overal bed mobility: Needs Assistance Bed Mobility: Rolling;Supine to Sit;Sit to Supine Rolling: Min guard   Supine to sit: Mod assist Sit to supine: Min assist   General bed mobility comments: pt able to roll L/R with use of bed rail, modA for trunk elevation for EOB transfer, pt initiated LEs to EOB, minA for LE management back to bed and to scoot to [facility]    Transfers Overall transfer level: Needs assistance Equipment used: 1 person hand held assist Transfers: Sit to/from Stand Sit to Stand: Max assist         General transfer comment: maxA to  power up and steady, pt with significant SOB, SpO2 at 78% (poor pleth reading), pts HR increased from 100-144bpm  Ambulation/Gait             General Gait Details: not tested due to SpO2 and HR  Stairs            Wheelchair Mobility    Modified Rankin (Stroke Patients Only)       Balance Overall balance assessment: Needs assistance Sitting-balance support: Bilateral upper extremity supported;Feet supported Sitting balance-Leahy Scale: Fair     Standing balance support: Bilateral upper extremity supported;During functional activity Standing balance-Leahy Scale: Poor Standing balance comment: dependent on physical assist                             Pertinent Vitals/Pain Pain Assessment: Faces Faces Pain Scale: No hurt    Home Living Family/patient expects to be discharged to:: Skilled nursing facility                 Additional Comments: per chart pt from facility and w/c bound, per pt she lives in a house with her children and works. aware pt has dementia and is a poor historian. Unsure of what facility and PLOF    Prior Function Level of Independence: Needs assistance   Gait / Transfers Assistance Needed: per chart pt is w/c bound  ADL's / Homemaking Assistance Needed: suspect assist from facility staff        Hand Dominance   Dominant Hand: Right    Extremity/Trunk Assessment   Upper Extremity Assessment Upper Extremity Assessment: Generalized weakness (pt with minimal bilat shld ROM, appears she had L shld surgery at some point)    Lower Extremity Assessment Lower Extremity Assessment: Generalized weakness    Cervical / Trunk Assessment Cervical / Trunk Assessment: Kyphotic  Communication   Communication: HOH;Other (comment) (pt on venti mask, hard to hear, very SOB making it hard to talk)  Cognition Arousal/Alertness: Awake/alert Behavior During Therapy: WFL for tasks assessed/performed Overall Cognitive Status: History  of cognitive impairments - at baseline                                 General Comments: per chart pt with dementia. pt able to follow all simple commands, pt reported being in her home at [place] but once re-oriented to place, date, and time pt able to recall t/o rest of session. pt reporting she lives at home with her children however per chart pt is from a facility      General Comments General comments (skin integrity, edema, etc.): pt with poor pleth reading, ranging from 78% SpO2 with activity to 91-93% at rest on 15LO2 NRB    Exercises     Assessment/Plan    PT Assessment Patient needs continued PT services  PT Problem List Decreased strength;Decreased range of motion;Decreased activity tolerance;Decreased balance;Decreased mobility;Decreased knowledge of use of DME;Cardiopulmonary status limiting activity;Decreased safety awareness;Decreased cognition       PT Treatment Interventions DME instruction;Gait training;Functional mobility training;Therapeutic activities;Therapeutic exercise;Balance training;Neuromuscular re-education    PT Goals (Current goals can be found in the Care Plan section)  Acute Rehab PT Goals Patient Stated Goal: didn't state PT Goal Formulation: Patient unable to participate in goal setting Time For Goal Achievement: 07/22/20 Potential to Achieve Goals: Fair    Frequency Min 2X/week   Barriers to discharge        Co-evaluation               AM-PAC PT "6 Clicks" Mobility  Outcome Measure Help needed turning from your back to your side while in a flat bed without using bedrails?: A Little Help needed moving from lying on your back to sitting on the side of a flat bed without using bedrails?: A Little Help needed moving to and from a bed to a chair (including a wheelchair)?: A Lot Help needed standing up from a chair using your arms (e.g., wheelchair or bedside chair)?: A Lot Help needed to walk in hospital room?: A Lot Help  needed climbing 3-5 steps with a railing? : Total 6 Click Score: 13    End of Session Equipment Utilized During Treatment: Oxygen Activity Tolerance: Treatment limited secondary to medical complications (Comment) (limited by SOB) Patient left: in bed;with call bell/phone within reach Nurse Communication: Mobility status (SPo2 and HR) PT Visit Diagnosis: Unsteadiness on feet (R26.81);Muscle weakness (generalized) (M62.81);Difficulty in walking, not elsewhere classified (R26.2)    Time: 5374-8270 PT Time Calculation (min) (ACUTE ONLY): 17 min   Charges:   PT Evaluation $PT Eval Moderate Complexity: 1 Mod          Kittie Plater, PT, DPT Acute Rehabilitation Services Pager #: [phone] Office #: [phone]   Berline Lopes 07/08/2020, 8:42 AM

## 2020-07-08 NOTE — Progress Notes (Signed)
NAME:  Hannah [name], MRN:  [id number], DOB:  05-28-1932, LOS: 2 ADMISSION DATE:  07-10-20, CONSULTATION DATE: 07-10-20 REFERRING MD: Dr. Dina Rich from ED, CHIEF COMPLAINT: Shortness of breath cough and altered mental status  Brief History:  87 year old female from nursing home coming in with sepsis due to multifocal pneumonia and acute hypoxic respiratory failure.  History of Present Illness:  Patient is encephalopathic so most of the history is taken from medical records 87 year old female with COPD, diabetes and chronic systolic and diastolic congestive heart failure who was brought into the emergency department from nursing home with a complaint of shortness of breath cough and altered mental status.  As per records patient was diagnosed with a pneumonia few days ago but unfortunately could not get antibiotics, got worse became short of breath when EMS was called her O2 sat was 79% on room air, she was placed on nonrebreather mask with improvement O2 sat 100% and she was brought to the emergency department.  Evening of 1/10, she had desaturation to the point of near arrest.  Code blue called and pt intubated by anesthesia before being transferred to [facility] ICU.  Past Medical History:  COPD Diabetes type 2 Hyperlipidemia Hypertension Chronic systolic/diastolic congestive heart failure Hyperlipidemia  Significant Hospital Events:  1/8 > admit. 1/9 SLP eval, mod aspiration risk 1/9 GI consult for dysphagia > barium esophogram ordered before deciding on next steps (? EGD, dilation for stricture etc). 1/10 PT eval > SNF recommended  Consults:  PCCM  Procedures:    Significant Diagnostic Tests:  1/8: X-ray chest: Consistent with multifocal pneumonia right more than left 1/10 barrium swallow > smooth narrowing of distal esophagus just above GE junction.  Micro Data:  1/8 influenza/Covid PCR negative 1/8 blood cultures > 1/8 urine culture >  Antimicrobials:  Ceftriaxone 1/8  > Azithromycin 1/8 >  Interim History / Subjective:    Objective   Blood pressure 134/68, pulse (!) 105, temperature 98.1 F (36.7 C), temperature source Oral, resp. rate 20, height 5\' 4"  (1.626 m), weight 59 kg, SpO2 (!) 73 %.    FiO2 (%):  [100 %] 100 %   Intake/Output Summary (Last 24 hours) at 07/08/2020 1827 Last data filed at 07/08/2020 1600 Gross per 24 hour  Intake 1452.47 ml  Output --  Net 1452.47 ml   Filed Weights   2020-07-10 2318  Weight: 59 kg    Examination: General: sedated unresponsive but moving all 4, intubated and sedated Neuro: sedated HEENT: ncat, eomi, perrla Cardiovascular: tachy  Lungs: clear billaterally, no wheeze Abdomen: nt nd bs + Musculoskeletal: moves all 4, LLE with mild swelling Skin: intact,     Assessment & Plan:   Acute on chronic hypoxic respiratory failure (chronically on 3L ) due to multifocal pneumonia / HCAP - declined further to the point of requiring intubation evening of 1/10 for near arrest. - Full vent support. - Continue abx and follow cultures. - Follow urine legionella and pneumococcal antigen. - Daily weaning. - Follow CXR.  A.fib with RVR. Hx sCHF (echo with EF 50 - 55%), HLD. - Continue heparin gtt. - Lopressor PRN for sustained HR > 115. - Consider amio if persists. - Continue home ASA. - Hold home Coreg, Demadex, Lasix  Dysphagia - barium swallow showed smooth narrowing of distal esophagus just above GE junction.  Per family, she has hx of esophageal stricture requiring dilation in the past. - GI following. - EGD when able, could consider doing while pt is intubated.  AoCKD. - Supportive care.  Hyperkalemia - s/p kayexalate. - 10g Lokelma now. - Repeat BMP.  Hx DM. - SSI. - Hold home Metformin.  Hx hypothyroidism. - Continue home synthroid.  LLE swelling:  -on heparin so less likely but with sudden hypoxic change -le dopplers pending.    Best practice (evaluated daily)  Diet:  NPO Pain/Anxiety/Delirium protocol (if indicated): Fentanyl gtt. VAP protocol (if indicated): In place DVT prophylaxis: Heparin gtt GI prophylaxis: PPI Glucose control: SSI Mobility: Bedrest  Disposition: Tx to ICU  Goals of Care:  Last date of multidisciplinary goals of care discussion: son Family and staff present:  Summary of discussion:  Follow up goals of care discussion due:  Code Status: dnr.. aggressive otherwise     Critical care time: The patient is critically ill with multiple organ systems failure and requires high complexity decision making for assessment and support, frequent evaluation and titration of therapies, application of advanced monitoring technologies and extensive interpretation of multiple databases.  Critical care time 42 mins. This represents my time independent of the NPs time taking care of the pt. This is excluding procedures.    [facility] 07/08/2020, 7:04 PM

## 2020-07-08 NOTE — Progress Notes (Signed)
    This pt is currently receiving hospice care with [place] at her ALF [facility] in [place]. WE have hr for primary diagnosis of senile degeneration of the brain and dementia.   We did put in new orders on the 7th for antibiotic of Z-pak. For her PNA.  She continued to decline and facility had pt come to ED for increased SOB.   The pt is a Full Code which has been the son's goals for her consistently.   Thank you for assisting [place] in the care of the pt and we will continue to follow until d/c.  Webb Silversmith RN [phone]

## 2020-07-08 NOTE — Progress Notes (Signed)
  Echocardiogram 2D Echocardiogram has been performed.  Hannah [name] 07/08/2020, 10:06 AM

## 2020-07-08 NOTE — Code Documentation (Signed)
  Patient Name: Hannah [name]   MRN: [id number]   Date of Birth/ Sex: 09/26/1931 , female      Admission Date: 07/30/20  Attending Provider: Terrilee Croak, MD  Primary Diagnosis: <principal problem not specified>   Indication: Pt was in her usual state of health until this PM, when she was noted to be respiratory failure. Code blue was subsequently called. At the time of arrival on scene, ACLS protocol was underway.   Technical Description:  - CPR performance duration:  none   - Was defibrillation or cardioversion used? No   - Was external pacer placed? No  - Was patient intubated pre/post CPR? Yes   Medications Administered: Y = Yes; Blank = No Amiodarone    Atropine    Calcium    Epinephrine    Lidocaine    Magnesium    Norepinephrine    Phenylephrine    Sodium bicarbonate    Vasopressin     Post CPR evaluation:  - Final Status - Was patient successfully resuscitated ? Yes - What is current rhythm? A. Fib - What is current hemodynamic status? Stable  Miscellaneous Information:  - Labs sent, including: Trops, BMP, CXR, ABG  - Primary team notified?  Yes  - Family Notified? Yes  - Additional notes/ transfer status: Patient found in respiratory failure and bradycardic down to the 60s but never lost pulse. Patient was intubated at bedside, given 100 fentanyl mcg in the room for agitation and 2 of Versed when transferred to ICU bed 2H09. Patient was placed on the vent after transfer.      Maudie Mercury, MD  07/08/2020, 6:49 PM

## 2020-07-09 ENCOUNTER — Inpatient Hospital Stay (HOSPITAL_COMMUNITY)

## 2020-07-09 ENCOUNTER — Encounter (HOSPITAL_COMMUNITY): Payer: Self-pay | Admitting: Internal Medicine

## 2020-07-09 DIAGNOSIS — N390 Urinary tract infection, site not specified: Secondary | ICD-10-CM

## 2020-07-09 DIAGNOSIS — J9601 Acute respiratory failure with hypoxia: Secondary | ICD-10-CM

## 2020-07-09 DIAGNOSIS — J189 Pneumonia, unspecified organism: Secondary | ICD-10-CM | POA: Diagnosis not present

## 2020-07-09 LAB — GLUCOSE, CAPILLARY
Glucose-Capillary: 138 mg/dL — ABNORMAL HIGH (ref 70–99)
Glucose-Capillary: 104 mg/dL — ABNORMAL HIGH (ref 70–99)
Glucose-Capillary: 115 mg/dL — ABNORMAL HIGH (ref 70–99)
Glucose-Capillary: 139 mg/dL — ABNORMAL HIGH (ref 70–99)
Glucose-Capillary: 127 mg/dL — ABNORMAL HIGH (ref 70–99)

## 2020-07-09 LAB — BASIC METABOLIC PANEL
Anion gap: 11 (ref 5–15)
BUN: 68 mg/dL — ABNORMAL HIGH (ref 8–23)
CO2: 20 mmol/L — ABNORMAL LOW (ref 22–32)
Calcium: 8.2 mg/dL — ABNORMAL LOW (ref 8.9–10.3)
Chloride: 99 mmol/L (ref 98–111)
Creatinine, Ser: 1.89 mg/dL — ABNORMAL HIGH (ref 0.44–1.00)
GFR, Estimated: 26 mL/min — ABNORMAL LOW (ref >60)
Glucose, Bld: 121 mg/dL — ABNORMAL HIGH (ref 70–99)
Potassium: 5 mmol/L (ref 3.5–5.1)
Sodium: 130 mmol/L — ABNORMAL LOW (ref 135–145)

## 2020-07-09 LAB — CBC
HCT: 33.4 % — ABNORMAL LOW (ref 36.0–46.0)
Hemoglobin: 10 g/dL — ABNORMAL LOW (ref 12.0–15.0)
MCH: 26.8 pg (ref 26.0–34.0)
MCHC: 29.9 g/dL — ABNORMAL LOW (ref 30.0–36.0)
MCV: 89.5 fL (ref 80.0–100.0)
Platelets: 252 10*3/uL (ref 150–400)
RBC: 3.73 MIL/uL — ABNORMAL LOW (ref 3.87–5.11)
RDW: 16.4 % — ABNORMAL HIGH (ref 11.5–15.5)
WBC: 11.3 10*3/uL — ABNORMAL HIGH (ref 4.0–10.5)
nRBC: 0.2 % (ref 0.0–0.2)

## 2020-07-09 LAB — PHOSPHORUS: Phosphorus: 4 mg/dL (ref 2.5–4.6)

## 2020-07-09 LAB — MRSA PCR SCREENING: MRSA by PCR: POSITIVE — AB

## 2020-07-09 LAB — MAGNESIUM
Magnesium: 2.1 mg/dL (ref 1.7–2.4)
Magnesium: 2.1 mg/dL (ref 1.7–2.4)

## 2020-07-09 LAB — VANCOMYCIN, RANDOM: Vancomycin Rm: 8

## 2020-07-09 LAB — HEPARIN LEVEL (UNFRACTIONATED): Heparin Unfractionated: 0.45 IU/mL (ref 0.30–0.70)

## 2020-07-09 MED ORDER — VANCOMYCIN HCL 750 MG/150ML IV SOLN
750.0000 mg | Freq: Once | INTRAVENOUS | Status: AC
  Administered 2020-07-09: 750 mg via INTRAVENOUS
  Filled 2020-07-09: qty 150

## 2020-07-09 MED ORDER — CHLORHEXIDINE GLUCONATE 0.12 % MT SOLN
OROMUCOSAL | Status: AC
  Filled 2020-07-09: qty 15

## 2020-07-09 MED ORDER — CHLORHEXIDINE GLUCONATE 0.12% ORAL RINSE (MEDLINE KIT)
15.0000 mL | Freq: Two times a day (BID) | OROMUCOSAL | Status: DC
  Administered 2020-07-09 – 2020-07-18 (×13): 15 mL via OROMUCOSAL

## 2020-07-09 MED ORDER — METOPROLOL TARTRATE 5 MG/5ML IV SOLN
2.5000 mg | INTRAVENOUS | Status: DC | PRN
  Administered 2020-07-10 – 2020-07-11 (×6): 2.5 mg via INTRAVENOUS
  Filled 2020-07-09 (×7): qty 5

## 2020-07-09 MED ORDER — METHYLPREDNISOLONE SODIUM SUCC 40 MG IJ SOLR
20.0000 mg | Freq: Every day | INTRAMUSCULAR | Status: AC
  Administered 2020-07-10 – 2020-07-12 (×3): 20 mg via INTRAVENOUS
  Filled 2020-07-09 (×4): qty 1

## 2020-07-09 MED ORDER — DEXAMETHASONE SODIUM PHOSPHATE 10 MG/ML IJ SOLN
10.0000 mg | Freq: Once | INTRAMUSCULAR | Status: AC
  Administered 2020-07-09: 10 mg via INTRAVENOUS
  Filled 2020-07-09: qty 1

## 2020-07-09 MED ORDER — RACEPINEPHRINE HCL 2.25 % IN NEBU
0.5000 mL | INHALATION_SOLUTION | RESPIRATORY_TRACT | Status: DC | PRN
  Administered 2020-07-09 – 2020-07-10 (×4): 0.5 mL via RESPIRATORY_TRACT
  Filled 2020-07-09 (×6): qty 0.5

## 2020-07-09 MED ORDER — MIDAZOLAM HCL 2 MG/2ML IJ SOLN
INTRAMUSCULAR | Status: AC
  Administered 2020-07-09: 2 mg via INTRAVENOUS
  Filled 2020-07-09: qty 2

## 2020-07-09 MED ORDER — ORAL CARE MOUTH RINSE
15.0000 mL | OROMUCOSAL | Status: DC
  Administered 2020-07-09 (×5): 15 mL via OROMUCOSAL

## 2020-07-09 MED ORDER — MUPIROCIN 2 % EX OINT
1.0000 "application " | TOPICAL_OINTMENT | Freq: Two times a day (BID) | CUTANEOUS | Status: AC
  Administered 2020-07-09 – 2020-07-13 (×10): 1 via NASAL
  Filled 2020-07-09 (×4): qty 22

## 2020-07-09 MED ORDER — MIDAZOLAM HCL 2 MG/2ML IJ SOLN: 2.0000 mg | Freq: Once | INTRAMUSCULAR | Status: AC

## 2020-07-09 NOTE — Progress Notes (Signed)
Pt arousable but still a bit sleepy. Tolerating cpap this afternoon. Will move to extubate when she is more awake.   Daughter at bedside. Would want intubation if fails but remains without cpr

## 2020-07-09 NOTE — Progress Notes (Signed)
Pharmacy Antibiotic Note  Hannah [name] is a 87 y.o. female admitted on 07/19/2020 with pneumonia.  Pharmacy has been consulted for vancomycin and cefepime dosing.  Vanc random level below goal at 8.  Plan: Vancomycin 750mg  IV x1.  Goal trough 15-20 mcg/mL. Cefepime dose remains appropriate for renal function.  Height: 5\' 4"  (162.6 cm) Weight: 59 kg (130 lb 1.1 oz) IBW/kg (Calculated) : 54.7  Temp (24hrs), Avg:98.1 F (36.7 C), Min:97.4 F (36.3 C), Max:98.4 F (36.9 C)  Recent Labs  Lab 07/25/2020 0003 06/29/2020 1455 07/16/2020 1555 07/07/20 0004 07/07/20 0155 07/07/20 0510 07/08/20 0137 07/08/20 1828 07/09/20 0048  WBC  --  13.7*  --   --   --  13.4* 16.1* 13.7* 11.3*  CREATININE  --  2.20*  --  2.20*  --  2.18* 2.22* 2.23*  --   LATICACIDVEN 1.3  --  1.2  --  1.2  --   --   --   --   VANCORANDOM  --   --   --   --   --   --   --   --  8    Estimated Creatinine Clearance: 15.6 mL/min (A) (by C-G formula based on SCr of 2.23 mg/dL (H)).    Allergies  Allergen Reactions  . Codeine Nausea And Vomiting  . Sulfa Antibiotics Nausea And Vomiting    Thank you for allowing pharmacy to be a part of this patient's care.  Wynona Neat, PharmD, BCPS  07/09/2020 2:51 AM

## 2020-07-09 NOTE — Progress Notes (Signed)
SLP Cancellation Note  Patient Details Name: Hannah [name] MRN: [id number] DOB: 1932-01-18   Cancelled treatment:       Reason Eval/Treat Not Completed: Patient not medically ready. Pt now intubated. Gi following to address esophageal dysphagia. SLP will sign off at this time.    Emmerson Taddei, Katherene Ponto 07/09/2020, 7:36 AM

## 2020-07-09 NOTE — Progress Notes (Signed)
OT Cancellation Note  Patient Details Name: DESTINE ZIRKLE MRN: [id number] DOB: 04-18-1932   Cancelled Treatment:    Reason Eval/Treat Not Completed: Patient not medically ready (Pt requiring vent and requiring rest. OT to evaluate next available treatment time.)   Jefferey Pica, OTR/L [place] Pager: [phone] Office: [phone]   Shameca Landen C 07/09/2020, 9:28 AM

## 2020-07-09 NOTE — Progress Notes (Signed)
   Hannah [name] is a hospice pt with [facility]. She has had an unfortunate decline. I have spoke to the pt's son Rex Laurann [place] regarding goals of care. He wants all measure to be done to help his mother improve from this illness. He has elected to revoke the Hospice Medicare Benefit in order to pursue aggressive curative measures for his mother.   Webb Silversmith RN [phone]

## 2020-07-09 NOTE — Progress Notes (Signed)
[facility] for heparin Indication: atrial fibrillation  Allergies  Allergen Reactions  . Codeine Nausea And Vomiting  . Sulfa Antibiotics Nausea And Vomiting    Patient Measurements: Height: 5\' 4"  (162.6 cm) Weight: 64.8 kg (142 lb 13.7 oz) IBW/kg (Calculated) : 54.7  Vital Signs: Temp: 98.4 F (36.9 C) (01/11 1125) Temp Source: Oral (01/11 1125) BP: 121/69 (01/11 1100) Pulse Rate: 81 (01/11 1100)  Labs: Recent Labs    07/02/2020 1555 07/15/2020 1855 07/07/20 0004 07/07/20 0300 07/07/20 1129 07/08/20 0137 07/08/20 1828 07/08/20 2023 07/08/20 2028 07/09/20 0048 07/09/20 0920  HGB  --    < >  --    < >  --  10.0* 9.9* 10.5*  --  10.0*  --   HCT  --    < >  --    < >  --  33.7* 32.8* 31.0*  --  33.4*  --   PLT  --   --   --    < >  --  256 269  --   --  252  --   APTT 30  --   --   --   --   --   --   --   --   --   --   LABPROT 14.2  --   --   --   --   --   --   --   --   --   --   INR 1.1  --   --   --   --   --   --   --   --   --   --   HEPARINUNFRC  --   --   --   --  0.39 0.42  --   --   --  0.45  --   CREATININE  --   --  2.20*   < >  --  2.22* 2.23*  --   --   --  1.89*  CKTOTAL  --   --  17*  --   --   --   --   --   --   --   --   TROPONINIHS  --   --   --   --   --   --  61*  --  59*  --   --    < > = values in this interval not displayed.    Estimated Creatinine Clearance: 18.5 mL/min (A) (by C-G formula based on SCr of 1.89 mg/dL (H)).  Assessment: 87yo female admitted w/ sepsis and found to be in Afib w/ RVR, to begin heparin. CHADSVASc = 6. Heparin level therapeutic, CBC stable.  Goal of Therapy:  Heparin level 0.3-0.7 units/ml Monitor platelets by anticoagulation protocol: Yes   Plan:  Continue heparin gtt at 900 units/hr Daily heparin level, CBC, s/s bleeding F/u long term [facility] plan  Arrie Senate, PharmD, BCPS, [facility] Clinical Pharmacist [phone] Please check AMION for all [facility]  numbers 07/09/2020

## 2020-07-09 NOTE — Progress Notes (Signed)
Patient resting in bed with eyes closed on vent at this time. Patient continues on PRVC with FIO2 at 40%, 8 peep, 20 RR and 430 TV. ETT 19 @ lip size 7.5. Patient also has OG tube, in place per x-ray. Diet remains NPO. Patient on gtts per [facility]. Purewick in place. Patient had 1 unmeasured U/O during shift and no BM. Rass -1. Patient does awaken upon voice and nods appropriately, also able to follow commands. MRSA +, Orders placed. Patients last K+ 5.6, patient given lokelma, new routine order for labs placed and provider aware. Patient in controlled A-Fib. Safety measures remain in place, will continue to monitor and SBARR to day shift nurse at change of shift.   CHG bath performed this shift.

## 2020-07-09 NOTE — Progress Notes (Signed)
NAME:  Hannah [name], MRN:  [id number], DOB:  1932-05-19, LOS: 3 ADMISSION DATE:  2020/07/10, CONSULTATION DATE: 07-10-20 REFERRING MD: Dr. Dina Rich from ED, CHIEF COMPLAINT: Shortness of breath cough and altered mental status  Brief History:  87 year old female from nursing home coming in with sepsis due to multifocal pneumonia and acute hypoxic respiratory failure.\ Had near arrest evening 1/10 and required intubation and transfer to ICU.  History of Present Illness:  Patient is encephalopathic so most of the history is taken from medical records 87 year old female with COPD, diabetes and chronic systolic and diastolic congestive heart failure who was brought into the emergency department from nursing home with a complaint of shortness of breath cough and altered mental status.  As per records patient was diagnosed with a pneumonia few days ago but unfortunately could not get antibiotics, got worse became short of breath when EMS was called her O2 sat was 79% on room air, she was placed on nonrebreather mask with improvement O2 sat 100% and she was brought to the emergency department.  Evening of 1/10, she had desaturation to the point of near arrest.  Code blue called and pt intubated by anesthesia before being transferred to [facility] ICU.  Past Medical History:  COPD Diabetes type 2 Hyperlipidemia Hypertension Chronic systolic/diastolic congestive heart failure Hyperlipidemia  Significant Hospital Events:  1/8 > admit. 1/9 SLP eval, mod aspiration risk 1/9 GI consult for dysphagia > barium esophogram ordered before deciding on next steps (? EGD, dilation for stricture etc). 1/10 PT eval > SNF recommended  Consults:  PCCM  Procedures:  ETT 1/10 >   Significant Diagnostic Tests:  1/8: X-ray chest: Consistent with multifocal pneumonia right more than left 1/10 barrium swallow > smooth narrowing of distal esophagus just above GE junction. 1/11 LE Duplex >   Micro Data:  1/8  influenza/Covid PCR negative 1/8 blood cultures > 1/8 urine culture > enterococcus  Antimicrobials:  Ceftriaxone 1/8 >1/10 Azithromycin 1/8 > 1/10 Cefepime 1/10 >  Vanc 1/10 >   Interim History / Subjective:  Opens eyes to voice.  Fentanyl just stopped. Slowly waking up.  Per RN, pt was following commands overnight.  Objective   Blood pressure 132/69, pulse 70, temperature (!) 97.5 F (36.4 C), temperature source Axillary, resp. rate 20, height 5\' 4"  (1.626 m), weight 64.8 kg, SpO2 100 %.    Vent Mode: PRVC FiO2 (%):  [40 %-100 %] 40 % Set Rate:  [20 bmp] 20 bmp Vt Set:  [430 mL] 430 mL PEEP:  [8 cmH20] 8 cmH20 Plateau Pressure:  [19 cmH20-21 cmH20] 20 cmH20   Intake/Output Summary (Last 24 hours) at 07/09/2020 0758 Last data filed at 07/09/2020 0600 Gross per 24 hour  Intake 958.86 ml  Output -  Net 958.86 ml   Filed Weights   Jul 10, 2020 2318 07/08/20 1830 07/09/20 0600  Weight: 59 kg 59 kg 64.8 kg    Examination: General: Elderly female, intubated, in NAD Neuro: Sedation just stopped.  Slowly waking up.  Opens eyes but not following commands HEENT: ncat, eomi, perrla Cardiovascular: tachy  Lungs: clear billaterally Abdomen: nt nd bs + Musculoskeletal: moves all 4, LLE with mild swelling Skin: intact, warm   Assessment & Plan:   Acute on chronic hypoxic respiratory failure (chronically on 3L [place]) due to multifocal pneumonia / HCAP - declined further to the point of requiring intubation evening of 1/10 for near arrest.  U.Legionella neg. - SBT this AM. - Continue abx and follow cultures. -  Follow urine pneumococcal antigen. - Wean steroids, 3 more days then off. - Follow CXR.  A.fib with RVR. Hx sCHF (echo with EF 50 - 55%), HLD. - Continue heparin gtt. - Lopressor PRN for sustained HR > 115. - Consider amio if persists. - Continue home ASA. - Hold home Coreg, Demadex, Lasix.  Urosepsis - urine cultures prelim growing enterococcus. - Continue abx and  follow cultures through completion.  Dysphagia - barium swallow showed smooth narrowing of distal esophagus just above GE junction.  Per family, she has hx of esophageal stricture requiring dilation in the past. - GI following. - EGD when able, could consider doing while pt is intubated.  AoCKD. - Supportive care, AM BMP pending.  Hyperkalemia - s/p kayexalate and lokelma. - AM BMP pending.  Hx DM. - SSI. - Hold home Metformin.  Hx hypothyroidism. - Continue home synthroid.  LLE swelling - on heparin so DVT less likely, but with sudden hypoxic change, reasonable to evaluate. - LE dopplers pending.    Best practice (evaluated daily)  Diet: NPO Pain/Anxiety/Delirium protocol (if indicated): Fentanyl gtt. VAP protocol (if indicated): In place DVT prophylaxis: Heparin gtt GI prophylaxis: PPI Glucose control: SSI Mobility: Bedrest  Disposition: Tx to ICU  Goals of Care:  Last date of multidisciplinary goals of care discussion: son updated by Dr. Ruthann Cancer 1/10 after arrival to ICU. Family and staff present:  Summary of discussion:  Follow up goals of care discussion due:  Code Status: dnr.. aggressive otherwise   CC time: 30 min.   Montey Hora, [facility] For pager details, please see AMION 07/09/2020, 8:09 AM

## 2020-07-09 NOTE — Plan of Care (Signed)
  Problem: Clinical Measurements: Goal: Ability to maintain clinical measurements within normal limits will improve Outcome: Progressing Goal: Will remain free from infection Outcome: Progressing Goal: Diagnostic test results will improve Outcome: Progressing   Problem: Clinical Measurements: Goal: Will remain free from infection Outcome: Progressing Goal: Respiratory complications will improve Outcome: Progressing   Problem: Activity: Goal: Risk for activity intolerance will decrease Outcome: Progressing   Problem: Nutrition: Goal: Adequate nutrition will be maintained Outcome: Progressing   Problem: Coping: Goal: Level of anxiety will decrease Outcome: Progressing   Problem: Elimination: Goal: Will not experience complications related to bowel motility Outcome: Progressing Goal: Will not experience complications related to urinary retention Outcome: Progressing   Problem: Pain Managment: Goal: General experience of comfort will improve Outcome: Progressing   Problem: Safety: Goal: Ability to remain free from injury will improve Outcome: Progressing   Problem: Skin Integrity: Goal: Risk for impaired skin integrity will decrease Outcome: Progressing   Problem: Activity: Goal: Ability to tolerate increased activity will improve Outcome: Progressing   Problem: Respiratory: Goal: Ability to maintain a clear airway and adequate ventilation will improve Outcome: Progressing   Problem: Role Relationship: Goal: Method of communication will improve Outcome: Progressing

## 2020-07-09 NOTE — Progress Notes (Signed)
Lower extremity venous bilateral study completed.   Please see CV Proc for preliminary results.   Amelianna Meller, RDMS  

## 2020-07-09 NOTE — Procedures (Signed)
Extubation Procedure Note  Patient Details:   Name: Hannah [name] DOB: 08/05/1931 MRN: [id number]   Airway Documentation:    Vent end date: 07/09/20 Vent end time: 1700   Evaluation  O2 sats: stable throughout Complications: No apparent complications Patient did tolerate procedure well. Bilateral Breath Sounds: Clear,Diminished   No, pt could not speak post extubation.  Pt extubated to 4 l/m [place]  Earney Navy 07/09/2020, 5:11 PM

## 2020-07-10 DIAGNOSIS — J189 Pneumonia, unspecified organism: Secondary | ICD-10-CM | POA: Diagnosis not present

## 2020-07-10 DIAGNOSIS — K219 Gastro-esophageal reflux disease without esophagitis: Secondary | ICD-10-CM | POA: Diagnosis not present

## 2020-07-10 LAB — URINE CULTURE: Culture: >=100000 — AB

## 2020-07-10 LAB — CBC
HCT: 35.8 % — ABNORMAL LOW (ref 36.0–46.0)
Hemoglobin: 10.3 g/dL — ABNORMAL LOW (ref 12.0–15.0)
MCH: 26.6 pg (ref 26.0–34.0)
MCHC: 28.8 g/dL — ABNORMAL LOW (ref 30.0–36.0)
MCV: 92.5 fL (ref 80.0–100.0)
Platelets: 256 10*3/uL (ref 150–400)
RBC: 3.87 MIL/uL (ref 3.87–5.11)
RDW: 16.9 % — ABNORMAL HIGH (ref 11.5–15.5)
WBC: 9.5 10*3/uL (ref 4.0–10.5)
nRBC: 0 % (ref 0.0–0.2)

## 2020-07-10 LAB — GLUCOSE, CAPILLARY
Glucose-Capillary: 118 mg/dL — ABNORMAL HIGH (ref 70–99)
Glucose-Capillary: 121 mg/dL — ABNORMAL HIGH (ref 70–99)
Glucose-Capillary: 134 mg/dL — ABNORMAL HIGH (ref 70–99)
Glucose-Capillary: 138 mg/dL — ABNORMAL HIGH (ref 70–99)
Glucose-Capillary: 154 mg/dL — ABNORMAL HIGH (ref 70–99)
Glucose-Capillary: 155 mg/dL — ABNORMAL HIGH (ref 70–99)
Glucose-Capillary: 155 mg/dL — ABNORMAL HIGH (ref 70–99)

## 2020-07-10 LAB — BASIC METABOLIC PANEL
Anion gap: 12 (ref 5–15)
BUN: 68 mg/dL — ABNORMAL HIGH (ref 8–23)
CO2: 20 mmol/L — ABNORMAL LOW (ref 22–32)
Calcium: 8.4 mg/dL — ABNORMAL LOW (ref 8.9–10.3)
Chloride: 101 mmol/L (ref 98–111)
Creatinine, Ser: 1.83 mg/dL — ABNORMAL HIGH (ref 0.44–1.00)
GFR, Estimated: 27 mL/min — ABNORMAL LOW (ref >60)
Glucose, Bld: 149 mg/dL — ABNORMAL HIGH (ref 70–99)
Potassium: 5.3 mmol/L — ABNORMAL HIGH (ref 3.5–5.1)
Sodium: 133 mmol/L — ABNORMAL LOW (ref 135–145)

## 2020-07-10 LAB — HEPARIN LEVEL (UNFRACTIONATED): Heparin Unfractionated: 0.52 IU/mL (ref 0.30–0.70)

## 2020-07-10 LAB — MAGNESIUM: Magnesium: 2.2 mg/dL (ref 1.7–2.4)

## 2020-07-10 LAB — PHOSPHORUS: Phosphorus: 4.2 mg/dL (ref 2.5–4.6)

## 2020-07-10 MED ORDER — SODIUM ZIRCONIUM CYCLOSILICATE 10 G PO PACK
10.0000 g | PACK | Freq: Every day | ORAL | Status: DC
  Administered 2020-07-10: 10 g via ORAL
  Filled 2020-07-10 (×2): qty 1

## 2020-07-10 MED ORDER — HALOPERIDOL LACTATE 5 MG/ML IJ SOLN
2.0000 mg | Freq: Once | INTRAMUSCULAR | Status: AC
  Administered 2020-07-10: 2 mg via INTRAVENOUS

## 2020-07-10 MED ORDER — DILTIAZEM HCL 25 MG/5ML IV SOLN
5.0000 mg | Freq: Once | INTRAVENOUS | Status: AC
  Administered 2020-07-10: 5 mg via INTRAVENOUS
  Filled 2020-07-10: qty 5

## 2020-07-10 MED ORDER — HALOPERIDOL LACTATE 5 MG/ML IJ SOLN
INTRAMUSCULAR | Status: AC
  Filled 2020-07-10: qty 1

## 2020-07-10 MED ORDER — DOCUSATE SODIUM 50 MG/5ML PO LIQD
100.0000 mg | Freq: Two times a day (BID) | ORAL | Status: DC
  Administered 2020-07-10 – 2020-07-12 (×2): 100 mg via ORAL
  Filled 2020-07-10 (×5): qty 10

## 2020-07-10 MED ORDER — CHLORHEXIDINE GLUCONATE 0.12 % MT SOLN
OROMUCOSAL | Status: AC
  Administered 2020-07-10: 15 mL via OROMUCOSAL
  Filled 2020-07-10: qty 15

## 2020-07-10 MED ORDER — HALOPERIDOL LACTATE 5 MG/ML IJ SOLN
2.0000 mg | Freq: Four times a day (QID) | INTRAMUSCULAR | Status: DC | PRN
  Administered 2020-07-10 – 2020-07-11 (×2): 2 mg via INTRAVENOUS
  Filled 2020-07-10 (×2): qty 1

## 2020-07-10 MED ORDER — POLYETHYLENE GLYCOL 3350 17 G PO PACK
17.0000 g | PACK | Freq: Every day | ORAL | Status: DC
  Filled 2020-07-10 (×3): qty 1

## 2020-07-10 MED ORDER — LINEZOLID 600 MG/300ML IV SOLN
600.0000 mg | Freq: Two times a day (BID) | INTRAVENOUS | Status: AC
  Administered 2020-07-10 – 2020-07-17 (×14): 600 mg via INTRAVENOUS
  Filled 2020-07-10 (×16): qty 300

## 2020-07-10 MED ORDER — RESOURCE THICKENUP CLEAR PO POWD
Freq: Once | ORAL | Status: DC
  Filled 2020-07-10: qty 125

## 2020-07-10 MED ORDER — DOCUSATE SODIUM 100 MG PO CAPS: 100.0000 mg | ORAL_CAPSULE | Freq: Two times a day (BID) | ORAL | Status: DC

## 2020-07-10 MED ORDER — LEVOTHYROXINE SODIUM 25 MCG PO TABS
25.0000 ug | ORAL_TABLET | Freq: Every day | ORAL | Status: DC
  Administered 2020-07-11 – 2020-07-13 (×3): 25 ug via ORAL
  Filled 2020-07-10 (×4): qty 1

## 2020-07-10 NOTE — Progress Notes (Signed)
[facility] Progress Note    Since last GI note: She had respiratory arrest, requiring ET intubation and about 24 hours of mech ventilation. She was extubated yesterday. This morning she is clearly confused, in mittens for disorientation and agitation.  Objective: Vital signs in last 24 hours: Temp:  [97.6 F (36.4 C)-98.4 F (36.9 C)] 97.8 F (36.6 C) (01/12 0343) Pulse Rate:  [30-137] 127 (01/12 0400) Resp:  [10-23] 15 (01/12 0400) BP: (120-155)/(51-100) 152/74 (01/12 0400) SpO2:  [79 %-100 %] 100 % (01/12 0400) FiO2 (%):  [36 %-40 %] 36 % (01/11 1732) Last BM Date:  (PTA) General: alert and oriented times 0 Heart: regular rate and rythm Abdomen: soft, non-tender, non-distended, normal bowel sounds  Lab Results: Recent Labs    07/08/20 1828 07/08/20 2023 07/09/20 0048 07/10/20 0037  WBC 13.7*  --  11.3* 9.5  HGB 9.9* 10.5* 10.0* 10.3*  PLT 269  --  252 256  MCV 92.1  --  89.5 92.5   Recent Labs    07/08/20 1828 07/08/20 2023 07/09/20 0920 07/10/20 0037  NA 130* 129* 130* 133*  K 5.8* 5.6* 5.0 5.3*  CL 98  --  99 101  CO2 17*  --  20* 20*  GLUCOSE 231*  --  121* 149*  BUN 67*  --  68* 68*  CREATININE 2.23*  --  1.89* 1.83*  CALCIUM 7.9*  --  8.2* 8.4*   Recent Labs    07/08/20 1828  PROT 5.8*  ALBUMIN 2.7*  AST 122*  ALT 100*  ALKPHOS 168*  BILITOT 0.5    Medications: Scheduled Meds: . chlorhexidine gluconate (MEDLINE KIT)  15 mL Mouth Rinse BID  . Chlorhexidine Gluconate Cloth  6 each Topical Daily  . docusate  100 mg Per Tube BID  . insulin aspart  0-9 Units Subcutaneous Q4H  . ipratropium  0.5 mg Nebulization Q6H  . levothyroxine  25 mcg Per Tube QAC breakfast  . methylPREDNISolone (SOLU-MEDROL) injection  20 mg Intravenous Daily  . mupirocin ointment  1 application Nasal BID  . pantoprazole (PROTONIX) IV  40 mg Intravenous Q12H  . polyethylene glycol  17 g Per Tube Daily  . vancomycin variable dose per unstable renal function  (pharmacist dosing)   Does not apply See admin instructions   Continuous Infusions: . ceFEPime (MAXIPIME) IV Stopped (07/10/20 0050)  . fentaNYL infusion INTRAVENOUS Stopped (07/09/20 1550)  . heparin 900 Units/hr (07/10/20 0400)   PRN Meds:.acetaminophen **OR** acetaminophen, albuterol, fentaNYL, metoprolol tartrate, Racepinephrine HCl    Assessment/Plan: 88 y.o. female with chronic dysphagia  She had a smooth narrowing of her distal esophagus on Barium esophagram 2 days ago.  Ideally I would recommend EGD for further evaluation however she is at quite HIGH RISK for any invasive procedures: chronically on 3LNC, she has pneumonia now, she just suffered a respiratory arrest and her Baseline dementia obviously complicates the picture as well.  I recommend supportive care. Can try a nasal feeding tube if she is not felt to be safe to eat and nutrition becomes an increasing concern.   Please call, page if she has significant recovery and dysphagia is still felt to be a problem for her. Signing off for now.   P , MD  07/10/2020, 8:18 AM [facility] Pager [phone]    

## 2020-07-10 NOTE — Progress Notes (Signed)
OT Cancellation Note  Patient Details Name: Hannah [name] MRN: [id number] DOB: 1931-10-03   Cancelled Treatment:    Reason Eval/Treat Not Completed: Medical issues which prohibited therapy. Extubated, disoriented and agitated.  OT will continue to check on her for appropriateness.    Oona Trammel D Shalisa Mcquade 07/10/2020, 12:21 PM

## 2020-07-10 NOTE — Progress Notes (Signed)
Updated son about pt's worsening delirium and calling out stating "I want to die". She also is impulsive and attempting to get oob frequently, pulling iv's.   Son states that they cont to want intubated should she require it but remains dnr otherwise.   Sitter ordered Prn haldol, will need qtc monitor

## 2020-07-10 NOTE — Evaluation (Signed)
Clinical/Bedside Swallow Evaluation Patient Details  Name: Hannah [name] MRN: [id number] Date of Birth: 10/21/1931  Today's Date: 07/10/2020 Time: SLP Start Time (ACUTE ONLY): 1255 SLP Stop Time (ACUTE ONLY): 1315 SLP Time Calculation (min) (ACUTE ONLY): 20 min  Past Medical History:  Past Medical History:  Diagnosis Date  . COPD (chronic obstructive pulmonary disease) ([place])   . DM2 (diabetes mellitus, type 2) ([place])   . Exertional dyspnea   . GERD (gastroesophageal reflux disease)   . HLD (hyperlipidemia)   . HTN (hypertension)   . Pneumonia    Past Surgical History:  Past Surgical History:  Procedure Laterality Date  . BRAIN SURGERY    . CHOLECYSTECTOMY    . L arm surgery    . TONSILLECTOMY    . TOTAL KNEE ARTHROPLASTY     HPI:  87 year old female from nursing home coming in with sepsis due to multifocal pneumonia and acute hypoxic respiratory failure. Clinical swallow evaluation was completed 1/09 with concerns for esophageal dysphagia, no real concerns for oropharyngeal dysphagia; full liquids recommended pending GI consult. Evening of 1/10, she had desaturation to the point of near arrest.  Code blue called and pt intubated by anesthesia before being transferred to [facility] ICU. Per Dr. Eugenia Pancoast note 1/12:  Pt "had a smooth narrowing of her distal esophagus on Barium esophagram 2 days ago.  Ideally I would recommend EGD for further evaluation however she is at quite [place] for any invasive procedures: chronically on 3LNC, she has pneumonia now, she just suffered a respiratory arrest and her Baseline dementia obviously complicates the picture as well."   Assessment / Plan / Recommendation Clinical Impression  Pt presents with some deterioration in oropharyngeal swallow since initial clinical assessment on 1/9, now with + s/s aspiration, likely attributable to short-term intubation as well as delirium.  Today, pt is restless, in mitt restraints, calling out repeatedly for help despite  frequent attention from staff.  Sips of thin water led to immediate and delayed coughing 50% of the time, concerning for potential aspiration; applesauce led to c/o of food "not going down" and frequent belching. Trials of nectar thick liquids were better tolerated with fewer s/s of aspiration. It appears from notes that EGD is still a possibility but not until condition improves. For now, recommend a dysphagia 1 diet with nectar-thick liquids (focus more on the liquids for easier transport through esophagus); crush meds and give in thick liquids or puree.  Hold tray if pt is coughing while eating/drinking.  D/W RN. SLP will follow. SLP Visit Diagnosis: Dysphagia, unspecified (R13.10)    Aspiration Risk  Moderate aspiration risk    Diet Recommendation   dysphagia 1/nectar thick liquids  Medication Administration: Crushed with puree    Other  Recommendations Oral Care Recommendations: Oral care BID Other Recommendations: Have oral suction available   Follow up Recommendations Skilled Nursing facility      Frequency and Duration min 2x/week  2 weeks       Prognosis Prognosis for Safe Diet Advancement: Good      Swallow Study   General Date of Onset: 07/20/20 HPI: 87 year old female from nursing home coming in with sepsis due to multifocal pneumonia and acute hypoxic respiratory failure. Clinical swallow evaluation was completed 1/09 with concerns for esophageal dysphagia, no real concerns for oropharyngeal dysphagia; full liquids recommended pending GI consult. Evening of 1/10, she had desaturation to the point of near arrest.  Code blue called and pt intubated by anesthesia before being transferred to  2H ICU. Per Dr. Eugenia Pancoast note 1/12:  Pt "had a smooth narrowing of her distal esophagus on Barium esophagram 2 days ago.  Ideally I would recommend EGD for further evaluation however she is at quite [place] for any invasive procedures: chronically on 3LNC, she has pneumonia now, she just  suffered a respiratory arrest and her Baseline dementia obviously complicates the picture as well." Type of Study: Bedside Swallow Evaluation Previous Swallow Assessment: 1/09 Diet Prior to this Study: NPO Temperature Spikes Noted: No Respiratory Status: Nasal cannula History of Recent Intubation: Yes Length of Intubations (days): 1 days Date extubated: 07/09/20 Behavior/Cognition: Alert;Confused Oral Cavity Assessment: Within Functional Limits Oral Care Completed by SLP: No Oral Cavity - Dentition: Adequate natural dentition Vision: Functional for self-feeding Self-Feeding Abilities: Able to feed self Patient Positioning: Upright in bed Baseline Vocal Quality: Hoarse Volitional Cough: Strong Volitional Swallow: Able to elicit    Oral/Motor/Sensory Function Overall Oral Motor/Sensory Function: Within functional limits   Ice Chips Ice chips: Within functional limits   Thin Liquid Thin Liquid: Impaired Presentation: Cup;Straw Pharyngeal  Phase Impairments: Cough - Immediate    Nectar Thick Nectar Thick Liquid: Within functional limits   Honey Thick Honey Thick Liquid: Not tested   Puree Puree: Impaired Presentation: Spoon Pharyngeal Phase Impairments: Multiple swallows Other Comments: belching   Solid     Solid: Not tested      Hannah [name] 07/10/2020,1:24 PM  Hannah [name], [place] Office number [phone] Pager [phone]

## 2020-07-10 NOTE — Progress Notes (Signed)
NAME:  Hannah [name], MRN:  [id number], DOB:  1931-08-17, LOS: 4 ADMISSION DATE:  07/12/2020, CONSULTATION DATE: 07/11/2020 REFERRING MD: Dr. Dina Rich from ED, CHIEF COMPLAINT: Shortness of breath cough and altered mental status  Brief History:  87 year old female from nursing home coming in with sepsis due to multifocal pneumonia and acute hypoxic respiratory failure.\ Had near arrest evening 1/10 and required intubation and transfer to ICU.  History of Present Illness:  Patient is encephalopathic so most of the history is taken from medical records 87 year old female with COPD, diabetes and chronic systolic and diastolic congestive heart failure who was brought into the emergency department from nursing home with a complaint of shortness of breath cough and altered mental status.  As per records patient was diagnosed with a pneumonia few days ago but unfortunately could not get antibiotics, got worse became short of breath when EMS was called her O2 sat was 79% on room air, she was placed on nonrebreather mask with improvement O2 sat 100% and she was brought to the emergency department.  Evening of 1/10, she had desaturation to the point of near arrest.  Code blue called and pt intubated by anesthesia before being transferred to [facility] ICU.  Past Medical History:  COPD Diabetes type 2 Hyperlipidemia Hypertension Chronic systolic/diastolic congestive heart failure Hyperlipidemia  Significant Hospital Events:  1/8 > admit. 1/9 SLP eval, mod aspiration risk 1/9 GI consult for dysphagia > barium esophogram ordered before deciding on next steps (? EGD, dilation for stricture etc). 1/10 PT eval > SNF recommended  Consults:  PCCM  Procedures:  ETT 1/10 > 1/11  Significant Diagnostic Tests:  1/8: X-ray chest: Consistent with multifocal pneumonia right more than left 1/10 barrium swallow > smooth narrowing of distal esophagus just above GE junction. 1/11 LE Duplex > neg  Micro Data:  1/8  influenza/Covid PCR negative 1/8 blood cultures >neg 1/8 urine culture > VRE  Antimicrobials:  Ceftriaxone 1/8 >1/10 Azithromycin 1/8 > 1/10 Cefepime 1/10 >  Vanc 1/10 > 1/12 linezolid 1/12->  Interim History / Subjective:  1/12: awake and following commands. Impulsive req restraints. Of course has moved rooms now 3 times in <40HK which certainly contributes to delirium. uti noted to be VRE and this has been inadquately treated 2/2 cx just returning. Changed to linezolid at this time to treat for 7 days.   However extubated yesterday and on home oxygen 3-4L [place],   Dysphonia 2/2 intubation. But improved from yesterday just post extubation.   Objective   Blood pressure 126/84, pulse (!) 103, temperature 98.9 F (37.2 C), temperature source Oral, resp. rate 15, height 5\' 4"  (1.626 m), weight 64.8 kg, SpO2 100 %.    Vent Mode: CPAP;PSV FiO2 (%):  [36 %-40 %] 36 % Set Rate:  [20 bmp] 20 bmp Vt Set:  [430 mL] 430 mL PEEP:  [5 cmH20] 5 cmH20 Pressure Support:  [10 cmH20] 10 cmH20 Plateau Pressure:  [18 cmH20] 18 cmH20   Intake/Output Summary (Last 24 hours) at 07/10/2020 1232 Last data filed at 07/10/2020 1000 Gross per 24 hour  Intake 826.42 ml  Output 1000 ml  Net -173.58 ml   Filed Weights   07/09/2020 2318 07/08/20 1830 07/09/20 0600  Weight: 59 kg 59 kg 64.8 kg    Examination: General: chronically ill appearing female restless in bed, in nad otherwise Neuro: follows commands, oriented to place and self otherwise impulsive req restraints HEENT: ncat, eomi, perrla, poor dentition Cardiovascular: irreg irreg Lungs: diminished effort Abdomen:  nt nd bs + Musculoskeletal: moves all 4 Skin: intact, warm   Assessment & Plan:   Acute on chronic hypoxic respiratory failure (chronically on 3L [place]) due to multifocal pneumonia / HCAP - declined further to the point of requiring intubation evening of 1/10 for near arrest.  -urine leg: neg -urine strep never sent as ordered -  extubated 1/11 and doing well on [place] on 3.5L -goal sat 88% - Wean steroids, 3 more days then off. - cxr stable without acute infiltrate notes  A.fib with RVR. Hx sCHF (echo with EF 50 - 55%), HLD. - Continue heparin gtt. - Lopressor PRN for sustained HR > 115. -dosed diltiazem and if beneficial could consider adding this once able to take po - Continue home ASA. - Hold home Coreg, Demadex, Lasix.  Sepsis 2/2 VRE uti -  - changing abx to linezolid -will need for 7 days from today.  -perhaps will improve mental status  Dysphagia - barium swallow showed smooth narrowing of distal esophagus just above GE junction.  Per family, she has hx of esophageal stricture requiring dilation in the past. - GI has signed off now.  -recommend allowing po if ok with speech vs small bore feeding tube - EGD when able  AoCKD. - Supportive care -monitor K  Hyperkalemia - s/p kayexalate and lokelma. - redosing lokelma when speech evals otherwise will need cortrak placement  Hx DM. - SSI. - Hold home Metformin.  Hx hypothyroidism. - Continue home synthroid.  LLE swelling -  - LE dopplers negative   Best practice (evaluated daily)  Diet: NPO, awaiting speech eval Pain/Anxiety/Delirium protocol (if indicated): prn VAP protocol (if indicated): na DVT prophylaxis: Heparin gtt GI prophylaxis: PPI Glucose control: SSI Mobility: Bedrest  Disposition: Tx to med tele  Goals of Care:  Last date of multidisciplinary goals of care discussion: duaghter updated by Dr. Ruthann Cancer 1/11 prior to extubation Family and staff present: daughter, RN, self Summary of discussion: dnr but aggressive otherwise including intubation Follow up goals of care discussion due: 1/17 Code Status: dnr.. aggressive otherwise, amendable to repeated intubation     care time 35 mins. This represents my time independent of the NPs time taking care of the pt. This is excluding procedures.    [place]  [facility] Care 07/10/2020, 12:32 PM

## 2020-07-10 NOTE — Progress Notes (Signed)
ANTICOAGULATION CONSULT NOTE - Follow Up  Pharmacy Consult for heparin Indication: atrial fibrillation  Allergies  Allergen Reactions  . Codeine Nausea And Vomiting  . Sulfa Antibiotics Nausea And Vomiting    Patient Measurements: Height: 5\' 4"  (162.6 cm) Weight: 64.8 kg (142 lb 13.7 oz) IBW/kg (Calculated) : 54.7 kg Heparin Dosing Weight: 59 kg  Vital Signs: Temp: 97.8 F (36.6 C) (01/12 0343) Temp Source: Axillary (01/12 0343) BP: 152/74 (01/12 0400) Pulse Rate: 127 (01/12 0400)  Labs: Recent Labs    07/08/20 0137 07/08/20 1828 07/08/20 2023 07/08/20 2028 07/09/20 0048 07/09/20 0920 07/10/20 0037  HGB 10.0* 9.9* 10.5*  --  10.0*  --  10.3*  HCT 33.7* 32.8* 31.0*  --  33.4*  --  35.8*  PLT 256 269  --   --  252  --  256  HEPARINUNFRC 0.42  --   --   --  0.45  --  0.52  CREATININE 2.22* 2.23*  --   --   --  1.89* 1.83*  TROPONINIHS  --  61*  --  59*  --   --   --     Estimated Creatinine Clearance: 19.1 mL/min (A) (by C-G formula based on SCr of 1.83 mg/dL (H)).  Assessment: 87 yo female admitted with sepsis due to pneumonia and found to be in Afib w/ RVR. CHADSVASc = 6. PTA the patient was not on anticoagulation. Pharmacy is consulted to dose heparin.  Heparin level this morning is therapeutic at 0.52 while running at 900 units/hr. CBC is stable and WNL. Per the RN, there were no issues with the infusion and the patient is without signs or symptoms of bleeding.   Goal of Therapy:  Heparin level 0.3-0.7 units/ml Monitor platelets by anticoagulation protocol: Yes   Plan:  Continue heparin IV at 900 units/hr Monitor a daily heparin level and CBC Monitor for signs and symptoms of bleeding Follow up long term anticoagulation plans   Shauna Hugh, PharmD, [place]  PGY-1 Pharmacy Resident 07/10/2020 7:33 AM  Please check [email or web address] for unit-specific pharmacy phone numbers.

## 2020-07-11 ENCOUNTER — Inpatient Hospital Stay (HOSPITAL_COMMUNITY)

## 2020-07-11 DIAGNOSIS — I4891 Unspecified atrial fibrillation: Secondary | ICD-10-CM | POA: Diagnosis not present

## 2020-07-11 DIAGNOSIS — J9622 Acute and chronic respiratory failure with hypercapnia: Secondary | ICD-10-CM | POA: Diagnosis not present

## 2020-07-11 DIAGNOSIS — J9601 Acute respiratory failure with hypoxia: Secondary | ICD-10-CM | POA: Diagnosis not present

## 2020-07-11 DIAGNOSIS — J9621 Acute and chronic respiratory failure with hypoxia: Secondary | ICD-10-CM | POA: Diagnosis not present

## 2020-07-11 LAB — CBC
HCT: 36.6 % (ref 36.0–46.0)
Hemoglobin: 11 g/dL — ABNORMAL LOW (ref 12.0–15.0)
MCH: 26.5 pg (ref 26.0–34.0)
MCHC: 30.1 g/dL (ref 30.0–36.0)
MCV: 88.2 fL (ref 80.0–100.0)
Platelets: 314 10*3/uL (ref 150–400)
RBC: 4.15 MIL/uL (ref 3.87–5.11)
RDW: 16.7 % — ABNORMAL HIGH (ref 11.5–15.5)
WBC: 14.1 10*3/uL — ABNORMAL HIGH (ref 4.0–10.5)
nRBC: 0.6 % — ABNORMAL HIGH (ref 0.0–0.2)

## 2020-07-11 LAB — BASIC METABOLIC PANEL
Anion gap: 13 (ref 5–15)
BUN: 63 mg/dL — ABNORMAL HIGH (ref 8–23)
CO2: 19 mmol/L — ABNORMAL LOW (ref 22–32)
Calcium: 8.7 mg/dL — ABNORMAL LOW (ref 8.9–10.3)
Chloride: 102 mmol/L (ref 98–111)
Creatinine, Ser: 1.61 mg/dL — ABNORMAL HIGH (ref 0.44–1.00)
GFR, Estimated: 31 mL/min — ABNORMAL LOW (ref >60)
Glucose, Bld: 84 mg/dL (ref 70–99)
Potassium: 4.5 mmol/L (ref 3.5–5.1)
Sodium: 134 mmol/L — ABNORMAL LOW (ref 135–145)

## 2020-07-11 LAB — CULTURE, BLOOD (SINGLE)
Culture: NO GROWTH
Culture: NO GROWTH
Special Requests: ADEQUATE

## 2020-07-11 LAB — GLUCOSE, CAPILLARY
Glucose-Capillary: 83 mg/dL (ref 70–99)
Glucose-Capillary: 93 mg/dL (ref 70–99)
Glucose-Capillary: 176 mg/dL — ABNORMAL HIGH (ref 70–99)
Glucose-Capillary: 160 mg/dL — ABNORMAL HIGH (ref 70–99)
Glucose-Capillary: 99 mg/dL (ref 70–99)

## 2020-07-11 LAB — HEPARIN LEVEL (UNFRACTIONATED): Heparin Unfractionated: 0.51 IU/mL (ref 0.30–0.70)

## 2020-07-11 MED ORDER — CHLORHEXIDINE GLUCONATE 0.12 % MT SOLN
OROMUCOSAL | Status: AC
  Filled 2020-07-11: qty 15

## 2020-07-11 MED ORDER — DILTIAZEM HCL-DEXTROSE 125-5 MG/125ML-% IV SOLN (PREMIX)
5.0000 mg/h | INTRAVENOUS | Status: DC
  Filled 2020-07-11: qty 125

## 2020-07-11 MED ORDER — DILTIAZEM LOAD VIA INFUSION
10.0000 mg | Freq: Once | INTRAVENOUS | Status: DC
  Filled 2020-07-11: qty 10

## 2020-07-11 MED ORDER — DILTIAZEM LOAD VIA INFUSION
5.0000 mg | Freq: Once | INTRAVENOUS | Status: AC
  Administered 2020-07-11: 5 mg via INTRAVENOUS
  Filled 2020-07-11: qty 5

## 2020-07-11 MED ORDER — DILTIAZEM HCL-DEXTROSE 125-5 MG/125ML-% IV SOLN (PREMIX)
5.0000 mg/h | INTRAVENOUS | Status: DC
  Administered 2020-07-11 – 2020-07-13 (×3): 5 mg/h via INTRAVENOUS
  Filled 2020-07-11 (×3): qty 125

## 2020-07-11 NOTE — Progress Notes (Signed)
NAME:  BENNYE NIX, MRN:  [id number], DOB:  1931-10-02, LOS: 5 ADMISSION DATE:  07/13/20, CONSULTATION DATE: 2020-07-13 REFERRING MD: Dr. Dina Rich from ED, CHIEF COMPLAINT: Shortness of breath cough and altered mental status  Brief History:  87 year old female from nursing home coming in with sepsis due to multifocal pneumonia and acute hypoxic respiratory failure.\ Had near arrest evening 1/10 and required intubation and transfer to ICU.  History of Present Illness:  Patient is encephalopathic so most of the history is taken from medical records 87 year old female with COPD, diabetes and chronic systolic and diastolic congestive heart failure who was brought into the emergency department from nursing home with a complaint of shortness of breath cough and altered mental status.  As per records patient was diagnosed with a pneumonia few days ago but unfortunately could not get antibiotics, got worse became short of breath when EMS was called her O2 sat was 79% on room air, she was placed on nonrebreather mask with improvement O2 sat 100% and she was brought to the emergency department.  Evening of 1/10, she had desaturation to the point of near arrest.  Code blue called and pt intubated by anesthesia before being transferred to [facility] ICU.  Past Medical History:  COPD Diabetes type 2 Hyperlipidemia Hypertension Chronic systolic/diastolic congestive heart failure Hyperlipidemia  Significant Hospital Events:  1/8 > admit. 1/9 SLP eval, mod aspiration risk 1/9 GI consult for dysphagia > barium esophogram ordered before deciding on next steps (? EGD, dilation for stricture etc). 1/10 PT eval > SNF recommended  Consults:  PCCM  Procedures:  ETT 1/10 > 1/11  Significant Diagnostic Tests:  1/8: X-ray chest: Consistent with multifocal pneumonia right more than left 1/10 barrium swallow > smooth narrowing of distal esophagus just above GE junction. 1/11 LE Duplex > neg  Micro Data:  1/8  influenza/Covid PCR negative 1/8 blood cultures >neg 1/8 urine culture > VRE  Antimicrobials:  Ceftriaxone 1/8 >1/10 Azithromycin 1/8 > 1/10 Cefepime 1/10 >  Vanc 1/10 > 1/12 linezolid 1/12->  Interim History / Subjective:  1/13: pt with increased wob this am. Somnolent req NIV. Yesterday evening with agitation and worsening delirium yelling " I want to die" 1/12: awake and following commands. Impulsive req restraints. Of course has moved rooms now 3 times in <40JW which certainly contributes to delirium. uti noted to be VRE and this has been inadquately treated 2/2 cx just returning. Changed to linezolid at this time to treat for 7 days.   However extubated yesterday and on home oxygen 3-4L [place],   Dysphonia 2/2 intubation. But improved from yesterday just post extubation.   Objective   Blood pressure (!) 158/94, pulse (!) 141, temperature 98.3 F (36.8 C), temperature source Oral, resp. rate (!) 23, height 5\' 4"  (1.626 m), weight 64.8 kg, SpO2 100 %.    Vent Mode: PCV;BIPAP FiO2 (%):  [36 %-50 %] 50 % Set Rate:  [10 bmp] 10 bmp   Intake/Output Summary (Last 24 hours) at 07/11/2020 1457 Last data filed at 07/11/2020 1400 Gross per 24 hour  Intake 961.06 ml  Output 350 ml  Net 611.06 ml   Filed Weights   07/13/2020 2318 07/08/20 1830 07/09/20 0600  Weight: 59 kg 59 kg 64.8 kg    Examination: General: chronically ill appearing female somnolent in bed, in nad otherwise Neuro: not following commands this am HEENT: ncat, eomi, perrla, poor dentition Cardiovascular: irreg irreg Lungs: diminished effort Abdomen: nt nd bs + Musculoskeletal: moves all 4  Skin: intact, warm   Assessment & Plan:   Acute on chronic hypoxic respiratory failure (chronically on 3L [place]): -urine leg: neg - extubated 1/11 and did ok yesterday with exception of agitated delirium.  -now on escalating oxygen requirement.  -repeat cxr pending.  -no clearly defined infiltrate. Chronic scarring notes -goal  sat 88% - Wean steroids as able - cxr pending -niv as needed -pt is poor candidate for reintubation and this has been expressed to family 2/2 chronic underlying lung issues. Dementia/hf and the fact this would be recurrent. Her liberation from vent will continue to more challenging the longer she is hospitalized and the more times she has resp failure. Her family is clear she is DNR but cont to state she would want reintubation as she would not want to die from pna. I have been unable to educate them otherwise.  -to be clear pt  (at the time of this writing) has NO clearly defined pna, she remains altered with poor respiratory function 2/2 chronic baseline pulmonary disease/dementia/ acute uti with VRE/ possible microaspirations 2/2 esophageal stricture and afib with rvr in setting of HFrEF -certainly agree with palliative consultation  A.fib with RVR. Hx sCHF (echo with EF 50 - 55%), HLD. - Continue heparin gtt.but remains poor long term candidate for ac 2/2 freq falls -dilt gtt - Continue home ASA. - Hold home Coreg, Demadex, Lasix. -primary has consulted cardiology  Sepsis 2/2 VRE uti -  - changing abx to linezolid -will need for 7 days from 1/12 -perhaps will improve mental status if this is treated but doubtful in setting of chronic dementia  Dysphagia - barium swallow showed smooth narrowing of distal esophagus just above GE junction.  Per family, she has hx of esophageal stricture requiring dilation in the past. - GI has signed off now.  -recommend allowing po if ok with speech vs small bore feeding tube - EGD when able -perhaps contributing to her acute on chronic hypoxic resp failure  AoCKD. - Supportive care -monitor K  Hyperkalemia - s/p kayexalate and lokelma. - redosing lokelma when speech evals otherwise will need cortrak placement  Hx DM. - SSI. - Hold home Metformin.  Hx hypothyroidism. - Continue home synthroid.  LLE swelling -  - LE dopplers  negative   Best practice (evaluated daily)  Diet: NPO Pain/Anxiety/Delirium protocol (if indicated): prn VAP protocol (if indicated): na DVT prophylaxis: Heparin gtt GI prophylaxis: PPI Glucose control: SSI Mobility: Bedrest  Disposition: Tx to med tele  Goals of Care:  Last date of multidisciplinary goals of care discussion: duaghter updated by Dr. Ruthann Cancer 1/11 prior to extubation again on 1/12 via phone with son Family and staff present: daughter, RN, self Summary of discussion: dnr but aggressive otherwise including intubation Follow up goals of care discussion due: 1/17 Code Status: dnr.. aggressive otherwise, amendable to repeated intubation despite multiple conversations re: multiple intubations, age and comorbidities may make liberation very challenging, certainly agree with palliative consultation     care time 25 mins. This represents my time independent of the NPs time taking care of the pt. This is excluding procedures.    [facility] 07/11/2020, 2:57 PM

## 2020-07-11 NOTE — Consult Note (Addendum)
Cardiology Consultation:   Patient ID: Hannah [name] MRN: [id number]; DOB: 1931-10-22  Admit date: 06/30/2020 Date of Consult: 07/11/2020  Primary Care Provider: Street, [name], MD [facility] Cardiologist: Mertie Moores, MD [facility] Electrophysiologist:  None    Patient Profile:   Hannah [name] is a 87 y.o. female with a hx of HTN, HLD, DM, COPD, GERD,dementia, CKD stage III, and chronic systolic and diastolic heart failure who is being seen today for the evaluation of Afib RVR at the request of Dr. Maren Beach.  History of Present Illness:   Hannah [name] was previously followed by Dr. 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  She has primarily received emergency care and has a history of admissions for decompensated heart failure and COPD exacerbations. Pt daughter is bedside and helps with history. Pt carries a PMH diagnosis of stroke, but daughter denies this (see Care Everywhere).   She was brought to [facility] from her nursing home with sepsis secondary to multifocal PNA and acute respiratory failure. Per chart, she had 5 days of SOB, cough, and fever and was diagnosed with PNA. Unfortunately, she didn't get ABX right away and became short of breath. She was previously maintained on ?3L O2 but on presentation needed 10L O2. She is wheelchair bound and with mild dementia. She was admitted for treatment 07/28/2020. Per chart review, on the evening of 1/10 she had a desaturation with near arrest. Code Blue called and patient was intubated and transferred to CVICU.  She was extubated 07/09/20. She was subsequently found to have VRE UTI and ABX were adjusted today. Course has been complicated by delirium (has moved rooms 3 times in 48 hrs) requiring mittens.   Cardiology consulted for Afib with bouts of RVR. During my exam, patient has a sitter present and was fighting BiPAP. Daughter in the room helps provide history. Pt  lives at nursing home in [place]. Pt states ABX were ordered on Thursday but did not receive until Saturday. Son visited pt and found her short of breath and in distress prompting EMS dispatch. Per daughter, they both have POA. They are currently trying to decide if they will agree with another intubation if needed.  Pt was in hospice care prior to admission. Daughter does report at least 5 falls in the past 6 months. She had sustained prior head injuries and had a head bleed about 10 years ago requiring surgery with neurosurgery at [facility].       Past Medical History:  Diagnosis Date  . COPD (chronic obstructive pulmonary disease) ([place])   . DM2 (diabetes mellitus, type 2) ([place])   . Exertional dyspnea   . GERD (gastroesophageal reflux disease)   . HLD (hyperlipidemia)   . HTN (hypertension)   . Pneumonia     Past Surgical History:  Procedure Laterality Date  . BRAIN SURGERY    . CHOLECYSTECTOMY    . L arm surgery    . TONSILLECTOMY    . TOTAL KNEE ARTHROPLASTY       Home Medications:  Prior to Admission medications   Medication Sig Start Date End Date Taking? Authorizing Provider  acetaminophen (TYLENOL) 325 MG tablet Take 650 mg by mouth every 8 (eight) hours as needed (pain).   Yes [provider]  acetaminophen (TYLENOL) 500 MG tablet Take 500 mg by mouth 2 (two) times daily.   Yes [provider]  albuterol (VENTOLIN HFA) 108 (90  Base) MCG/ACT inhaler Inhale 2 puffs into the lungs every 4 (four) hours as needed for wheezing or shortness of breath.   Yes [provider]  amitriptyline (ELAVIL) 25 MG tablet Take 25 mg by mouth at bedtime.   Yes [provider]  Ascorbic Acid (VITAMIN C) 1000 MG tablet Take 1,000 mg by mouth 2 (two) times daily.   Yes [provider]  aspirin EC 81 MG tablet Take 81 mg by mouth daily. Swallow whole.   Yes [provider]  azithromycin (ZITHROMAX) 250 MG tablet Take 250-500 mg by mouth See admin  instructions. Take 2 tablets (500 mg) by mouth 1st day, then take 1 tablet (250 mg) daily on days 2-5   Yes [provider]  benzonatate (TESSALON) 100 MG capsule Take 100 mg by mouth every 8 (eight) hours as needed for cough.   Yes [provider]  bethanechol (URECHOLINE) 10 MG tablet Take 10 mg by mouth 2 (two) times daily.   Yes [provider]  Calcium Carb-Cholecalciferol (CALCIUM 600+D) 600-800 MG-UNIT TABS Take 1 tablet by mouth 2 (two) times daily.   Yes [provider]  carvedilol (COREG) 3.125 MG tablet Take 3.125 mg by mouth 2 (two) times daily.   Yes [provider]  dicyclomine (BENTYL) 10 MG capsule Take 10 mg by mouth 2 (two) times daily.   Yes [provider]  Ensure (ENSURE) Take 237 mLs by mouth 3 (three) times daily.   Yes [provider]  ferrous sulfate 325 (65 FE) MG tablet Take 325 mg by mouth 2 (two) times daily.   Yes [provider]  fluticasone (FLONASE) 50 MCG/ACT nasal spray Place 1 spray into both nostrils daily.   Yes [provider]  furosemide (LASIX) 40 MG tablet Take 40 mg by mouth daily.   Yes [provider]  ipratropium (ATROVENT) 0.02 % nebulizer solution Take 0.5 mg by nebulization 3 (three) times daily.   Yes [provider]  levothyroxine (SYNTHROID) 25 MCG tablet Take 25 mcg by mouth daily before breakfast.   Yes [provider]  loratadine (CLARITIN) 10 MG tablet Take 10 mg by mouth daily.   Yes [provider]  LORazepam (ATIVAN) 0.5 MG tablet Take 0.5 mg by mouth every 6 (six) hours as needed for anxiety.   Yes [provider]  meclizine (ANTIVERT) 25 MG tablet Take 25 mg by mouth every 6 (six) hours as needed for dizziness.   Yes [provider]  metFORMIN (GLUCOPHAGE) 500 MG tablet Take 500 mg by mouth 2 (two) times daily.   Yes [provider]  mirabegron ER (MYRBETRIQ) 25 MG TB24 tablet Take 25 mg by mouth at  bedtime.   Yes [provider]  montelukast (SINGULAIR) 10 MG tablet Take 10 mg by mouth at bedtime.   Yes [provider]  morphine (ROXANOL) 20 MG/ML concentrated solution Take 5 mg by mouth every 2 (two) hours as needed for severe pain or shortness of breath.   Yes [provider]  omeprazole (PRILOSEC) 40 MG capsule Take 40 mg by mouth daily before breakfast.   Yes [provider]  ondansetron (ZOFRAN) 4 MG tablet Take 4 mg by mouth every 6 (six) hours as needed for nausea or vomiting.   Yes [provider]  OXYGEN Inhale 3 L into the lungs continuous.   Yes [provider]  PARoxetine (PAXIL) 20 MG tablet Take 20 mg by mouth daily.   Yes [provider]  Polyethyl Glycol-Propyl Glycol (SYSTANE) 0.4-0.3 % SOLN Place 1 drop into both eyes every 12 (twelve) hours as needed (dry eyes).   Yes [provider]  potassium chloride SA (KLOR-CON) 20 MEQ tablet Take 20 mEq by mouth daily as needed (with each dose of torsemide).   Yes [provider]  promethazine-dextromethorphan (PROMETHAZINE-DM) 6.25-15 MG/5ML syrup Take 5 mLs by mouth every 6 (six) hours as needed for cough.   Yes [provider]  senna-docusate (SENOKOT-S) 8.6-50 MG tablet Take 1 tablet by mouth every 12 (twelve) hours as needed (constipation).   Yes [provider]  tiZANidine (ZANAFLEX) 2 MG tablet Take 2 mg by mouth every 12 (twelve) hours as needed for muscle spasms.   Yes [provider]  torsemide (DEMADEX) 10 MG tablet Take 10 mg by mouth daily as needed (swelling).   Yes [provider]  traMADol (ULTRAM) 50 MG tablet Take 50 mg by mouth every 12 (twelve) hours as needed (chronic pain).   Yes [provider]    Inpatient Medications: Scheduled Meds: . chlorhexidine      . chlorhexidine gluconate (MEDLINE KIT)  15 mL Mouth Rinse BID  . Chlorhexidine Gluconate Cloth  6 each Topical Daily  . docusate   100 mg Oral BID  . insulin aspart  0-9 Units Subcutaneous Q4H  . ipratropium  0.5 mg Nebulization Q6H  . levothyroxine  25 mcg Oral QAC breakfast  . methylPREDNISolone (SOLU-MEDROL) injection  20 mg Intravenous Daily  . mupirocin ointment  1 application Nasal BID  . pantoprazole (PROTONIX) IV  40 mg Intravenous Q12H  . polyethylene glycol  17 g Oral Daily  . Resource ThickenUp Clear   Oral Once   Continuous Infusions: . ceFEPime (MAXIPIME) IV Stopped (07/10/20 2244)  . diltiazem (CARDIZEM) infusion 5 mg/hr (07/11/20 1308)  . heparin 900 Units/hr (07/11/20 1155)  . linezolid (ZYVOX) IV 300 mL/hr at 07/11/20 1000   PRN Meds: acetaminophen **OR** acetaminophen, albuterol, haloperidol lactate, metoprolol tartrate, Racepinephrine HCl  Allergies:    Allergies  Allergen Reactions  . Codeine Nausea And Vomiting  . Sulfa Antibiotics Nausea And Vomiting    Social History:   Social History   Socioeconomic History  . Marital status: Widowed    Spouse name: Not on file  . Number of children: 2  . Years of education: Not on file  . Highest education level: Not on file  Occupational History  . Occupation: retired  Tobacco Use  . Smoking status: Former Smoker    Packs/day: 1.00    Years: 30.00    Pack years: 30.00    Quit date: 09/27/2009    Years since quitting: 10.7  . Smokeless tobacco: Not on file  Substance and Sexual Activity  . Alcohol use: No  . Drug use: Not on file  . Sexual activity: Not on file  Other Topics Concern  . Not on file  Social History Narrative  . Not on file   Social Determinants of Health   Financial Resource Strain: Not on file  Food Insecurity: Not on file  Transportation Needs: Not on file  Physical Activity: Not on file  Stress: Not on file  Social Connections: Not on file  Intimate Partner Violence: Not on file    Family History:    Family History  Problem Relation Age of Onset  . Heart attack Other   . Heart disease Other      ROS:   Please see the history of  present illness.   All other ROS reviewed and negative.     Physical Exam/Data:   Vitals:   07/11/20 0800 07/11/20 0845 07/11/20 1015 07/11/20 1120  BP: (!) 164/89 (!) 164/89 (!) 153/113   Pulse: (!) 139 (!) 141 (!) 146   Resp: (!) 27 (!) 23 (!) 25   Temp: 98.3 F (36.8 C)     TempSrc: Oral     SpO2: 93% 97% 91% 94%  Weight:      Height:        Intake/Output Summary (Last 24 hours) at 07/11/2020 1409 Last data filed at 07/11/2020 1000 Gross per 24 hour  Intake 801.92 ml  Output 350 ml  Net 451.92 ml   Last 3 Weights 07/09/2020 07/08/2020 07/20/2020  Weight (lbs) 142 lb 13.7 oz 130 lb 1.1 oz 130 lb  Weight (kg) 64.8 kg 59 kg 58.968 kg     Body mass index is 24.52 kg/m.  General: elderly female in mild respiratory distress, confused, in mittens, sitter at bedside, frail Neck: BiPAP in place Cardiac:  Irregular rhythm, tachycardic rate, + murmur Lungs:  BiPAP in place, lung sounds difficult  Abd: soft, nontender, no hepatomegaly  Ext: right cool LE, pulses palpable - recommend to follow closely Musculoskeletal:  No deformities, BUE and BLE strength normal and equal Skin: warm and dry  Neuro:  CNs 2-12 intact, no focal abnormalities noted Psych:  Normal affect   EKG:  The EKG was personally reviewed and demonstrates:  Atrial fibrillation with ventricular rate of 96 --> 101, artifact Telemetry:  Telemetry was personally reviewed and demonstrates:  Atrial fibrillation with ventricular rates 120-140s  Relevant CV Studies:  Echo 07/08/20: 1. Left ventricular ejection fraction, by estimation, is 50 to 55%. The  left ventricle has low normal function. The left ventricle demonstrates  global hypokinesis. There is moderate concentric left ventricular  hypertrophy. Left ventricular diastolic  function could not be evaluated.  2. Right ventricular systolic function is mildly reduced. The right  ventricular size is not well visualized. There is  moderately elevated  pulmonary artery systolic pressure. The estimated right ventricular  systolic pressure is 25.6 mmHg.  3. Left atrial size was mild to moderately dilated.  4. The mitral valve is degenerative. Moderate mitral valve regurgitation.  No evidence of mitral stenosis. Moderate mitral annular calcification.  5. The aortic valve is tricuspid. There is severe calcifcation of the  aortic valve. There is moderate thickening of the aortic valve. The non  coronary cusp is immobile. Aortic valve regurgitation is mild. Mild to  moderate aortic valve stenosis. Aortic  valve area, by VTI measures 1.23 cm. Aortic valve mean gradient measures  9.8 mmHg. Aortic valve Vmax measures 1.98 m/s. The DI is 0.54. The AVA is  underestimated due to small LVOT measurement but likely more than mild AS  as the SVI is low at 26.  Findings consistent with low flow low gradient Aortic stenosis.  6. The inferior vena cava is dilated in size with <50% respiratory  variability, suggesting right atrial pressure of 15 mmHg.  7. Trivial pericardial effusion anterior to the right ventricle.   Laboratory Data:  High Sensitivity Troponin:   Recent Labs  Lab 07/08/20 1828 07/08/20 2028  TROPONINIHS 61* 59*     Chemistry Recent Labs  Lab 07/09/20 0920 07/10/20 0037 07/11/20 0556  NA 130* 133* 134*  K 5.0 5.3* 4.5  CL 99 101 102  CO2 20* 20* 19*  GLUCOSE 121* 149* 84  BUN  68* 68* 63*  CREATININE 1.89* 1.83* 1.61*  CALCIUM 8.2* 8.4* 8.7*  GFRNONAA 26* 27* 31*  ANIONGAP _0 Recent Labs  Lab 07/02/2020 1555 07/07/20 0510 07/08/20 1828  PROT 6.6 6.2* 5.8*  ALBUMIN 3.2* 2.8* 2.7*  AST 85* 86* 122*  ALT 50* 61* 100*  ALKPHOS 131* 150* 168*  BILITOT 0.6 0.8 0.5   Hematology Recent Labs  Lab 07/09/20 0048 07/10/20 0037 07/11/20 0556  WBC 11.3* 9.5 14.1*  RBC 3.73* 3.87 4.15  HGB 10.0* 10.3* 11.0*  HCT 33.4* 35.8* 36.6  MCV 89.5 92.5 88.2  MCH 26.8 26.6 26.5  MCHC 29.9*  28.8* 30.1  RDW 16.4* 16.9* 16.7*  PLT 252 256 314   BNP Recent Labs  Lab 07/10/2020 1540  BNP 564.9*    DDimer No results for input(s): DDIMER in the last 168 hours.   Radiology/Studies:  DG Chest 1 View  Result Date: 07/08/2020 CLINICAL DATA:  87 year old female status post intubation. EXAM: CHEST  1 VIEW COMPARISON:  Chest radiograph dated 07/26/2020. FINDINGS: Endotracheal tube with tip entering into the right mainstem bronchus. Recommend retraction by approximately 5 cm. Left lung base atelectasis versus infiltrate. The right lung is clear. No pleural effusion pneumothorax. The cardiac silhouette is within limits. Atherosclerotic calcification of the aorta. No acute osseous pathology. Degenerative changes of the spine. IMPRESSION: 1. Right mainstem intubation. Recommend retraction by approximately 5 cm. 2. Left lung base atelectasis versus infiltrate. These results were called by telephone at the time of interpretation on 07/08/2020 at 6:56 pm to Nurse Shelby Dubin, who verbally acknowledged these results. Electronically Signed   By: Anner Crete M.D.   On: 07/08/2020 19:03   DG Abd 1 View  Result Date: 07/08/2020 CLINICAL DATA:  87 year old female with adjustment the ETT EXAM: ABDOMEN - 1 VIEW COMPARISON:  Chest radiograph dated 04/16/2011. FINDINGS: Endotracheal tube with tip approximately 3 cm above the carina. Enteric tube extends below the diaphragm with tip in the gastric fundus. Left lung base density may represent atelectasis or infiltrate. No pneumothorax. Small bilateral pleural effusions may be present. The cardiac silhouette is within limits. Atherosclerotic calcification of the aorta. No acute osseous pathology. IMPRESSION: 1. Endotracheal tube above the carina. 2. Left lung base atelectasis versus infiltrate. Electronically Signed   By: Anner Crete M.D.   On: 07/08/2020 21:18   DG Chest Port 1 View  Result Date: 07/09/2020 CLINICAL DATA:  ETT, respiratory failure  EXAM: PORTABLE CHEST 1 VIEW COMPARISON:  Radiograph 07/08/2020 FINDINGS: *Endotracheal tube terminates 4.3 cm from the carina. *Transesophageal tube coils in the left upper quadrant with administered contrast media in the distal extent of the tube and collecting in the gastric lumen. *Telemetry leads and external support devices overlie the chest. Persistent bandlike opacity in the left lung base. Some developing patchy and interstitial opacities are noted elsewhere in the chest. No pneumothorax or visible effusion. Stable cardiomediastinal contours with a calcified aorta. Severe degenerative changes of the bilateral shoulders including features of rotator cuff insufficiency. Additional degenerative changes in the spine. No acute osseous abnormality or suspicious osseous lesion. IMPRESSION: 1. Satisfactory positioning of the endotracheal, transesophageal tubes. 2. Bandlike opacities in the left lung likely reflect areas of scarring and or atelectasis. 3. Increasing patchy and interstitial opacities elsewhere in the chest, could reflect developing edema or infection. 4.  Aortic Atherosclerosis (ICD10-I70.0). Electronically Signed   By: Lovena Le M.D.   On: 07/09/2020 06:26   ECHOCARDIOGRAM COMPLETE  Result Date:  07/08/2020    ECHOCARDIOGRAM REPORT   Patient Name:   JEMINA SCAHILL Date of Exam: 07/08/2020 Medical Rec #:  [id number]     Height:       64.0 in Accession #:    [id number]    Weight:       130.0 lb Date of Birth:  11-25-1931      BSA:          1.629 m Patient Age:    64 years      BP:           122/78 mmHg Patient Gender: F             HR:           112 bpm. Exam Location:  Inpatient Procedure: 2D Echo, Cardiac Doppler and Color Doppler Indications:    Atrial fibrillation  History:        Patient has no prior history of Echocardiogram examinations.                 CHF, COPD; Risk Factors:Diabetes, Hypertension and Dyslipidemia.                 GERD.  Sonographer:    Clayton Lefort RDCS (AE) Referring Phys:  [place]  1. Left ventricular ejection fraction, by estimation, is 50 to 55%. The left ventricle has low normal function. The left ventricle demonstrates global hypokinesis. There is moderate concentric left ventricular hypertrophy. Left ventricular diastolic function could not be evaluated.  2. Right ventricular systolic function is mildly reduced. The right ventricular size is not well visualized. There is moderately elevated pulmonary artery systolic pressure. The estimated right ventricular systolic pressure is 73.5 mmHg.  3. Left atrial size was mild to moderately dilated.  4. The mitral valve is degenerative. Moderate mitral valve regurgitation. No evidence of mitral stenosis. Moderate mitral annular calcification.  5. The aortic valve is tricuspid. There is severe calcifcation of the aortic valve. There is moderate thickening of the aortic valve. The non coronary cusp is immobile. Aortic valve regurgitation is mild. Mild to moderate aortic valve stenosis. Aortic valve area, by VTI measures 1.23 cm. Aortic valve mean gradient measures 9.8 mmHg. Aortic valve Vmax measures 1.98 m/s. The DI is 0.54. The AVA is underestimated due to small LVOT measurement but likely more than mild AS as the SVI is low at 26. Findings consistent with low flow low gradient Aortic stenosis.  6. The inferior vena cava is dilated in size with <50% respiratory variability, suggesting right atrial pressure of 15 mmHg.  7. Trivial pericardial effusion anterior to the right ventricle. FINDINGS  Left Ventricle: Left ventricular ejection fraction, by estimation, is 50 to 55%. The left ventricle has low normal function. The left ventricle demonstrates global hypokinesis. The left ventricular internal cavity size was normal in size. There is moderate concentric left ventricular hypertrophy. Left ventricular diastolic function could not be evaluated due to atrial fibrillation. Left ventricular diastolic function  could not be evaluated. Right Ventricle: The right ventricular size is not well visualized. Right vetricular wall thickness was not well visualized. Right ventricular systolic function is mildly reduced. There is moderately elevated pulmonary artery systolic pressure. The tricuspid regurgitant velocity is 3.12 m/s, and with an assumed right atrial pressure of 15 mmHg, the estimated right ventricular systolic pressure is 32.9 mmHg. Left Atrium: Left atrial size was mild to moderately dilated. Right Atrium: Right atrial size was normal in size. Pericardium: Trivial pericardial effusion is  present. The pericardial effusion is anterior to the right ventricle. Presence of pericardial fat pad. Mitral Valve: The mitral valve is degenerative in appearance. There is mild thickening of the anterior mitral valve leaflet(s). There is mild calcification of the anterior mitral valve leaflet(s). Moderate mitral annular calcification. Moderate mitral valve regurgitation. No evidence of mitral valve stenosis. Tricuspid Valve: The tricuspid valve is normal in structure. Tricuspid valve regurgitation is mild . No evidence of tricuspid stenosis. Aortic Valve: The aortic valve is tricuspid. There is severe calcifcation of the aortic valve. There is moderate thickening of the aortic valve. Aortic valve regurgitation is mild. Mild to moderate aortic stenosis is present. Aortic valve mean gradient measures 9.8 mmHg. Aortic valve peak gradient measures 15.6 mmHg. Aortic valve area, by VTI measures 1.23 cm. Pulmonic Valve: The pulmonic valve was normal in structure. Pulmonic valve regurgitation is not visualized. No evidence of pulmonic stenosis. Aorta: The aortic root is normal in size and structure. Venous: The inferior vena cava is dilated in size with less than 50% respiratory variability, suggesting right atrial pressure of 15 mmHg. IAS/Shunts: No atrial level shunt detected by color flow Doppler.  LEFT VENTRICLE PLAX 2D LVIDd:          2.90 cm LVIDs:         2.40 cm LV PW:         1.40 cm LV IVS:        1.60 cm LVOT diam:     1.70 cm LV SV:         42 LV SV Index:   26 LVOT Area:     2.27 cm  LV Volumes (MOD) LV vol d, MOD A2C: 43.7 ml LV vol d, MOD A4C: 59.4 ml LV vol s, MOD A2C: 27.1 ml LV vol s, MOD A4C: 35.5 ml LV SV MOD A2C:     16.6 ml LV SV MOD A4C:     59.4 ml LV SV MOD BP:      21.4 ml RIGHT VENTRICLE            IVC RV Basal diam:  2.70 cm    IVC diam: 2.10 cm RV S prime:     5.87 cm/s TAPSE (M-mode): 0.9 cm LEFT ATRIUM             Index       RIGHT ATRIUM           Index LA diam:        4.30 cm 2.64 cm/m  RA Area:     14.90 cm LA Vol (A2C):   52.1 ml 31.98 ml/m RA Volume:   33.30 ml  20.44 ml/m LA Vol (A4C):   74.2 ml 45.55 ml/m LA Biplane Vol: 66.6 ml 40.88 ml/m  AORTIC VALVE AV Area (Vmax):    1.10 cm AV Area (Vmean):   1.07 cm AV Area (VTI):     1.23 cm AV Vmax:           197.80 cm/s AV Vmean:          149.200 cm/s AV VTI:            0.341 m AV Peak Grad:      15.6 mmHg AV Mean Grad:      9.8 mmHg LVOT Vmax:         95.56 cm/s LVOT Vmean:        70.260 cm/s LVOT VTI:          0.185 m LVOT/AV  VTI ratio: 0.54  AORTA Ao Root diam: 3.00 cm MR Peak grad: 102.4 mmHg  TRICUSPID VALVE MR Mean grad: 71.0 mmHg   TR Peak grad:   38.9 mmHg MR Vmax:      506.00 cm/s TR Vmax:        312.00 cm/s MR Vmean:     399.0 cm/s                           SHUNTS                           Systemic VTI:  0.18 m                           Systemic Diam: 1.70 cm Fransico Him MD Electronically signed by Fransico Him MD Signature Date/Time: 07/08/2020/10:46:00 AM    Final    VAS [place] LOWER EXTREMITY VENOUS (DVT)  Result Date: 07/09/2020  Lower Venous DVT Study Indications: Hypoxia.  Limitations: Patient unable to reposition. and poor ultrasound/tissue interface. Comparison Study: No prior studies. Performing Technologist: Darlin Coco RDMS  Examination Guidelines: A complete evaluation includes B-mode imaging, spectral Doppler, color Doppler, and power  Doppler as needed of all accessible portions of each vessel. Bilateral testing is considered an integral part of a complete examination. Limited examinations for reoccurring indications may be performed as noted. The reflux portion of the exam is performed with the patient in reverse Trendelenburg.  +---------+---------------+---------+-----------+----------+--------------+ RIGHT    CompressibilityPhasicitySpontaneityPropertiesThrombus Aging +---------+---------------+---------+-----------+----------+--------------+ CFV      Full           Yes      Yes                                 +---------+---------------+---------+-----------+----------+--------------+ SFJ      Full                                                        +---------+---------------+---------+-----------+----------+--------------+ FV Prox  Full                                                        +---------+---------------+---------+-----------+----------+--------------+ FV Mid   Full                                                        +---------+---------------+---------+-----------+----------+--------------+ FV DistalFull                                                        +---------+---------------+---------+-----------+----------+--------------+ PFV      Full                                                        +---------+---------------+---------+-----------+----------+--------------+  POP      Full           Yes      Yes                                 +---------+---------------+---------+-----------+----------+--------------+ PTV      Full                                                        +---------+---------------+---------+-----------+----------+--------------+ PERO                                                  Not visualized +---------+---------------+---------+-----------+----------+--------------+    +---------+---------------+---------+-----------+----------+--------------+ LEFT     CompressibilityPhasicitySpontaneityPropertiesThrombus Aging +---------+---------------+---------+-----------+----------+--------------+ CFV      Full           Yes      Yes                                 +---------+---------------+---------+-----------+----------+--------------+ SFJ      Full                                                        +---------+---------------+---------+-----------+----------+--------------+ FV Prox  Full                                                        +---------+---------------+---------+-----------+----------+--------------+ FV Mid   Full                                                        +---------+---------------+---------+-----------+----------+--------------+ FV DistalFull                                                        +---------+---------------+---------+-----------+----------+--------------+ PFV      Full                                                        +---------+---------------+---------+-----------+----------+--------------+ POP                     Yes      Yes                                 +---------+---------------+---------+-----------+----------+--------------+  PTV      Full                                                        +---------+---------------+---------+-----------+----------+--------------+ PERO                                                  Not visualized +---------+---------------+---------+-----------+----------+--------------+     Summary: RIGHT: - There is no evidence of deep vein thrombosis in the lower extremity. However, portions of this examination were limited- see technologist comments above.  - No cystic structure found in the popliteal fossa.  LEFT: - There is no evidence of deep vein thrombosis in the lower extremity. However, portions of this examination were  limited- see technologist comments above.  - No cystic structure found in the popliteal fossa.  *See table(s) above for measurements and observations. Electronically signed by Deitra Mayo MD on 07/09/2020 at 2:22:53 PM.    Final    DG ESOPHAGUS W SINGLE CM (SOL OR THIN BA)  Result Date: 07/08/2020 CLINICAL DATA:  Difficulty swallowing.  Hypoxia. EXAM: ESOPHAGUS/BARIUM SWALLOW/TABLET STUDY TECHNIQUE: Initial scout AP supine abdominal image obtained to insure adequate colon cleansing. Barium was introduced into the colon in a retrograde fashion and refluxed from the rectum to the cecum. Spot images of the colon followed by overhead radiographs were obtained. FLUOROSCOPY TIME:  Fluoroscopy Time:  2 minutes, 6 seconds Radiation Exposure Index (if provided by the fluoroscopic device): 17.0 mGy Number of Acquired Spot Images: 0 COMPARISON:  Chest radiograph 07/16/2020 FINDINGS: The patient is infirm and today's examination was performed using thin barium with the patient in the LPO position. Right clavicular deformity compatible with healed fracture. Atherosclerotic calcification of the aortic arch noted. Primary peristaltic waves in the esophagus were intact on 4/4 swallows although there was secondary and tertiary contraction in the distal esophagus on most swallows. Mild distal esophageal fold thickening may reflect esophagitis. There is smooth narrowing of the distal esophagus just above the GE junction. I was only able to distend this area of smooth narrowing up to about 0.7 cm, for example on image 15 of series 3. Accordingly, I am suspicious for a smooth stricture. A 13 mm barium tablet initially impacted in the mid to distal esophagus despite the lack of any narrowing/stricture in this vicinity, but later extended to the distal esophagus beyond which I was not able to further advance the tablet with swallows of water. IMPRESSION: 1. Smooth narrowing of the distal esophagus just above the GE junction. I  was only able to distend this area up to about 0.7 cm. A 13 mm barium tablet initially impacted in this vicinity. Although I not see irregularity to suggest tumor, endoscopy to further evaluate this stricture and for potential therapy should be considered when the patient is suitably stable. 2. Mild distal esophageal fold thickening may reflect esophagitis. Electronically Signed   By: Van Clines M.D.   On: 07/08/2020 13:58     Assessment and Plan:   Atrial fibrillation with RVR  - history of PAF per 2018 note, no anticoagulation  - EKG with Afib with ventricular rate 90-100s - suspect in response to sepsis with PNA  and UTI - 5 mg IV cardizem given yesterday, cardizem drip ordered for this afternoon, has not started yet - TSH WNL - Mg and K WNL - telemetry with  This patients CHA2DS2-VASc Score and unadjusted Ischemic Stroke Rate (% per year) is equal to 7.2 % stroke rate/year from a score of 5 (HTN, 2age, female, DM, 2CVA, CHF) - per the family and Care Everywhere, pt has a history of frequent falls with head injuries, history of stroke, and hx of ICH requiring surgery  - difficult situation - long term anticoagulation will need to be a discussion with the patient (if delirium resolves) and family - will try to rate control with cardizem - would be a poor candidate for amiodarone given respiratory status. - thankfully she is on the hypertensive side, allowing for rate controlling agents   Moderate MR Mild to moderate AS Elevated PA pressure - echo with preserved EF, but valvular disease as above - symptom management in acute illness - home medications list both 40 mg lasix daily and 10 mg torsemide daily PRN - will need to sort this out in her discharge med reconciliation - should not need two different diuretics   Hypertension - home medications include 3.125 mg coreg BID - holding this for now given cardizem drip    Acute on chronic kidney disease stage III - sCr trending  down - 1.61 from peak of 2.20 with hyperkalemia - per primary   Elevated troponin  - hs troponin 61 --> 59 - suspect demand ischemia - no plans for ischemic evaluation during her acute illness, also partial code   DM - A1c 5.3% - per primary    Confirm that the CHADS2-VASc score is correct.   If not, click here to update score.   Then [facility] your note. :[id number]} CHA2DS2-VASc Score = 8  This indicates a 10.8% annual risk of stroke. The patient's score is based upon: CHF History: Yes HTN History: Yes Diabetes History: Yes Stroke History: Yes Vascular Disease History: No Age Score: 2 Gender Score: 1         For questions or updates, please contact [place] Please consult [email or web address] for contact info under    Signed, Ledora Bottcher, [place]  07/11/2020 2:09 PM   Attending Note:   The patient was seen and examined.  Agree with assessment and plan as noted above.  Changes made to the above note as needed.  Patient seen and independently examined with  Doreene Adas, PA .   We discussed all aspects of the encounter. I agree with the assessment and plan as stated above.  1.   Atrial fib:   HR is better controlled on the dilt drip.   She is on heparin currently but unfortunately, she has a long history of frequent falls and is not a candidate for chronic oral anticoagulation .  She opens her eyes but does not acknowledge my presence.  She currently has agonal respirations.  She apparently was on BiPAP earlier this morning.  I think the issue of her atrial fibrillation is not really the key issue here.  She has sepsis syndrome, pneumonia and respiratory distress.  The family said that they would want her to be intubated but I think that she needs to be seen by palliative care.  Her prognosis is quite poor.     I have spent a total of 40 minutes with patient reviewing hospital  notes , telemetry, EKGs, labs and examining patient as well as establishing  an  assessment and plan that was discussed with the patient. > 50% of time was spent in direct patient care.  [facility] will sign off.   Medication Recommendations:  Supportive care per [facility] team  Other recommendations (labs, testing, etc):   Follow up as an outpatient:  With her primary md    Ramond Dial., MD, [facility] 07/11/2020, 2:40 PM [address]  [place] Pager [phone]

## 2020-07-11 NOTE — Progress Notes (Signed)
ANTICOAGULATION CONSULT NOTE - Follow Up  Pharmacy Consult for heparin Indication: atrial fibrillation  Allergies  Allergen Reactions  . Codeine Nausea And Vomiting  . Sulfa Antibiotics Nausea And Vomiting    Patient Measurements: Height: 5\' 4"  (162.6 cm) Weight: 64.8 kg (142 lb 13.7 oz) IBW/kg (Calculated) : 54.7 kg Heparin Dosing Weight: 59 kg  Vital Signs: Temp: 98.3 F (36.8 C) (01/13 0800) Temp Source: Oral (01/13 0800) BP: 153/113 (01/13 1015) Pulse Rate: 146 (01/13 1015)  Labs: Recent Labs    07/08/20 1828 07/08/20 2023 07/08/20 2028 07/09/20 0048 07/09/20 0920 07/10/20 0037 07/11/20 0556  HGB 9.9*   < >  --  10.0*  --  10.3* 11.0*  HCT 32.8*   < >  --  33.4*  --  35.8* 36.6  PLT 269  --   --  252  --  256 314  HEPARINUNFRC  --   --   --  0.45  --  0.52 0.51  CREATININE 2.23*  --   --   --  1.89* 1.83* 1.61*  TROPONINIHS 61*  --  59*  --   --   --   --    < > = values in this interval not displayed.    Estimated Creatinine Clearance: 21.7 mL/min (A) (by C-G formula based on SCr of 1.61 mg/dL (H)).  Assessment: 87 yo female admitted with sepsis due to pneumonia and found to be in Afib w/ RVR. CHADSVASc = 6. PTA the patient was not on anticoagulation. Pharmacy is consulted to dose heparin.  Heparin level this morning is therapeutic at 0.51 while running at 900 units/hr. CBC is stable and WNL. Per the RN, there were no issues with the infusion and the patient is without signs or symptoms of bleeding.   Goal of Therapy:  Heparin level 0.3-0.7 units/ml Monitor platelets by anticoagulation protocol: Yes   Plan:  Continue heparin IV at 900 units/hr Monitor a daily heparin level and CBC Monitor for signs and symptoms of bleeding Follow up long term anticoagulation plans   Shauna Hugh, PharmD, [place]  PGY-1 Pharmacy Resident 07/11/2020 1:02 PM  Please check [email or web address] for unit-specific pharmacy phone numbers.

## 2020-07-11 NOTE — Progress Notes (Deleted)
Dysphagia diet

## 2020-07-11 NOTE — Progress Notes (Signed)
  Speech Language Pathology Treatment: Dysphagia  Patient Details Name: Hannah [name] MRN: [id number] DOB: 04-Feb-1932 Today's Date: 07/11/2020 Time: 5176-1607 SLP Time Calculation (min) (ACUTE ONLY): 17 min  Assessment / Plan / Recommendation Clinical Impression  Pt subjectively describes better ability to swallow purees today compared to previous date, saying that they are "going down." She needs Mod cues to initiate swallowing after getting boluses into her mouth, but does swallow when cued to do so. Cup sips of nectar thick liquids elicit coughing, which RN also reported observing earlier this morning. When limited to smaller volumes via spoon, pt had no further overt s/s of aspiration. Continue to suspect an acute component secondary to mentation and recent intubation, as well as what may be a more prominent underlying esophageal issue. Would continue current diet, offering nectar thick liquids via spoon though, and with full supervision. Can hold POs if further coughing is elicited during meals.    HPI HPI: 87 year old female from nursing home coming in with sepsis due to multifocal pneumonia and acute hypoxic respiratory failure. Clinical swallow evaluation was completed 1/09 with concerns for esophageal dysphagia, no real concerns for oropharyngeal dysphagia; full liquids recommended pending GI consult. Evening of 1/10, she had desaturation to the point of near arrest.  Code blue called and pt intubated by anesthesia before being transferred to [facility] ICU. Per Dr. Eugenia Pancoast note 1/12:  Pt "had a smooth narrowing of her distal esophagus on Barium esophagram 2 days ago.  Ideally I would recommend EGD for further evaluation however she is at quite [place] for any invasive procedures: chronically on 3LNC, she has pneumonia now, she just suffered a respiratory arrest and her Baseline dementia obviously complicates the picture as well."      SLP Plan  Continue with current plan of care        Recommendations  Diet recommendations: Dysphagia 1 (puree);Nectar-thick liquid Liquids provided via: Teaspoon Medication Administration: Crushed with puree Supervision: Staff to assist with self feeding;Full supervision/cueing for compensatory strategies Compensations: Slow rate;Small sips/bites Postural Changes and/or Swallow Maneuvers: Seated upright 90 degrees;Upright 30-60 min after meal                Oral Care Recommendations: Oral care BID Follow up Recommendations: Skilled Nursing facility SLP Visit Diagnosis: Dysphagia, unspecified (R13.10) Plan: Continue with current plan of care       GO                Osie Bond., M.A. [place] Acute Rehabilitation Services Pager [phone] Office [phone]  07/11/2020, 10:55 AM

## 2020-07-11 NOTE — Progress Notes (Signed)
Physical Therapy Treatment Patient Details Name: Hannah [name] MRN: [id number] DOB: 07-Mar-1932 Today's Date: 07/11/2020    History of Present Illness Hannah [name] is a 87 y.o. female with medical history significant of DM2, COPD on 3L at baseline, GERD, HLD, HTN, dementia presenting to ED with 5d hx of SOB, cought, and fever. Recent PNA dx, started on antiobiotics.Evening of 1/10, she had desaturation to the point of near arrest.  Code blue called and pt intubated by anesthesia before being transferred to [facility] ICU.    PT Comments    Pt admitted with above diagnosis. Pt was able to sit EOB for a few minutes with HR incr and SaO2 decr (pleth not good).  Pt attempted to stand with +2 mod assist but could not sustain but did stand enough to scoot to [facility]. Pt confused which also limits progression. Will continue to progress pt as able.  Pt currently with functional limitations due to balance and endurance deficits. Pt will benefit from skilled PT to increase their independence and safety with mobility to allow discharge to the venue listed below.    Follow Up Recommendations  SNF;Supervision/Assistance - 24 hour     Equipment Recommendations  None recommended by PT (suspect has DME from facility)    Recommendations for Other Services       Precautions / Restrictions Precautions Precautions: Fall Precaution Comments: poor pleth reading, watch SpO2, 5LO2 Restrictions Weight Bearing Restrictions: No    Mobility  Bed Mobility Overal bed mobility: Needs Assistance Bed Mobility: Rolling;Supine to Sit;Sit to Supine Rolling: Min guard   Supine to sit: Min guard Sit to supine: Min assist   General bed mobility comments: pt able to roll L/R with use of bed rail, min guard assist for trunk elevation to EOB with pt initiated LEs to EOB, Pt did need minA for LE management back to bed and to scoot to [facility]  Transfers Overall transfer level: Needs assistance Equipment used: 2 person hand held  assist Transfers: Sit to/from Stand Sit to Stand: Mod assist;+2 physical assistance;From elevated surface         General transfer comment: modA to power up and steady, pt with significant SOB, SpO2 at 78% (poor pleth reading), pts HR increased from 130-145bpm throughout with nurse giving meds initially before assistintg pt to EOB. Able to scoot to [facility] as pt not standing fully upright.  Ambulation/Gait             General Gait Details: not tested due to SpO2 and HR   Stairs             Wheelchair Mobility    Modified Rankin (Stroke Patients Only)       Balance Overall balance assessment: Needs assistance Sitting-balance support: Bilateral upper extremity supported;Feet supported Sitting balance-Leahy Scale: Fair Sitting balance - Comments: Min guard assist to sit EOB x 10 min                                    Cognition Arousal/Alertness: Awake/alert Behavior During Therapy: WFL for tasks assessed/performed Overall Cognitive Status: History of cognitive impairments - at baseline                                 General Comments: per daughter pt with dementia. pt able to follow all simple commands, Pt resides in Memory Care  unit at [facility]. Used wheelchair and was Modif I with transfers and assist with ADLs per daughter.      Exercises General Exercises - Lower Extremity Long Arc Quad: AROM;Both;10 reps;Seated    General Comments        Pertinent Vitals/Pain Pain Assessment: Faces Faces Pain Scale: No hurt    Home Living                      Prior Function            PT Goals (current goals can now be found in the care plan section) Acute Rehab PT Goals Patient Stated Goal: didn't state Progress towards PT goals: Progressing toward goals    Frequency    Min 2X/week      PT Plan Current plan remains appropriate    Co-evaluation              AM-PAC PT "6 Clicks" Mobility   Outcome  Measure  Help needed turning from your back to your side while in a flat bed without using bedrails?: A Little Help needed moving from lying on your back to sitting on the side of a flat bed without using bedrails?: A Little Help needed moving to and from a bed to a chair (including a wheelchair)?: A Lot Help needed standing up from a chair using your arms (e.g., wheelchair or bedside chair)?: A Lot Help needed to walk in hospital room?: A Lot Help needed climbing 3-5 steps with a railing? : Total 6 Click Score: 13    End of Session Equipment Utilized During Treatment: Oxygen Activity Tolerance: Treatment limited secondary to medical complications (Comment);Patient limited by fatigue (limited by SOB) Patient left: in bed;with call bell/phone within reach;with family/visitor present;with restraints reapplied Nurse Communication: Mobility status (SPo2 and HR) PT Visit Diagnosis: Unsteadiness on feet (R26.81);Muscle weakness (generalized) (M62.81);Difficulty in walking, not elsewhere classified (R26.2)     Time: 1012-1029 PT Time Calculation (min) (ACUTE ONLY): 17 min  Charges:  $Therapeutic Activity: 8-22 mins                     Kiela Shisler W,PT Acute Rehabilitation Services Pager:  [phone]  Office:  [place] 07/11/2020, 12:53 PM

## 2020-07-11 NOTE — Progress Notes (Signed)
OT Cancellation Note  Patient Details Name: STORY CONTI MRN: [id number] DOB: 1932-04-11   Cancelled Treatment:    Reason Eval/Treat Not Completed: Medical issues which prohibited therapy.  Spoke with nurse, HR elevated and nursing asking to try back after respiratory sees the patient. OT to continue efforts as appropriate.  PT was able to stand the patient this morning.    Kalijah Westfall D Almedia Cordell 07/11/2020, 10:57 AM

## 2020-07-11 NOTE — Progress Notes (Addendum)
PROGRESS NOTE    Hannah [name]  IDP:[id number] DOB: 1931-07-10 DOA: 07/25/2020 PCP: Street, [name], MD   Chief Complaint  Patient presents with  . Shortness of Breath   Brief Narrative: 87 y.o. female with medical history significant of DM2, COPD on 3L at baseline, GERD, HLD, HTN, dementia, wheelchair-bound who was enrolled in hospice of [name] prior to admission who was recently diagnosed with pneumonia and was started on antibiotics on day of admission brought to the ED  07/13/2020 with hypoxia 79% RA, and was admitted for septic shock due to CAP, new onset of A.fib, and had desaturation event to the point of near arrest on 1/30 CODE BLUE called intubated and transferred to ICU. Significant events: 1/8 admitted 1/9 SLP eval, mod aspiration risk 1/9 GI consult for dysphagia > barium esophogram ordered before deciding on next steps (? EGD, dilation for stricture etc). 1/10 PT eval > SNF recommended 1/10-intubated 1/11-extubated to [facility] 1/13- tx to [facility], in resp distress needing bipap  Subjective: Seen this morning patient is confused able to tell me her name Later patient noted to be in respiratory distress, put on BiPAP remained in A. fib with RVR and Cardizem drip started. Her daughter was at the bedside and discussed extensively- Pulmonary also notified about worsening resp distress  Assessment & Plan:  Acute on chronic hypoxic respiratory failure, baseline on 3 L, multifactorial with weakness, dysphagia,with baseline COPD: Extubated 1/11, on nasal cannula.  On cefepime/Zyvox, Solu-Medrol.  Last chest x-ray without acute infiltrates.  Patient is back on BiPAP this afternoon.  Prognosis guarded at risk of intubation daughter at the bedside updated again.  New onset A. fib with RVR/history of systolic CHF with EF 50 to 55%/hyperlipidemia: On heparin GTT, asa Lopressor as needed.  Poorly controlled HR.Home Coreg Demadex Lasix on hold.  Will consult cardiology.  With ongoing RVR and  respiratory distress placed on Cardizem drip, as she did respond with Cardizem bolus in the ICU yesterday, did not respond with Lopressor IV.  Acute metabolic encephalopathy likely multifactorial with failure to thrive, delirium hospital-acquired in the setting of dementia.  Continue supportive measures  Sepsis due to VRE UTI on Zyvox x7 days starting 1/12  Dysphagia  with narrowing of distal esophagus just above GE junction in barium swallow, history of esophageal strictures needing dilation in the past.  Seen by GI and signed off.  Speech following.  AKI on CKD??  Baseline/with metabolic acidosis: Creatinine peaked at 2.2, downtrending.   Recent Labs  Lab 07/08/20 0137 07/08/20 1828 07/09/20 0920 07/10/20 0037 07/11/20 0556  BUN 63* 67* 68* 68* 63*  CREATININE 2.22* 2.23* 1.89* 1.83* 1.61*   Hyperkalemia status post Kayexalate and Lokelma.  Potassium stable Recent Labs  Lab 07/08/20 1828 07/08/20 2023 07/09/20 0920 07/10/20 0037 07/11/20 0556  K 5.8* 5.6* 5.0 5.3* 4.5   History of diabetes mellitus hold home metformin,  A1c stable 5.3. Recent Labs  Lab 07/10/20 1936 07/10/20 2315 07/11/20 0406 07/11/20 0847 07/11/20 1236  GLUCAP 118* 155* 83 93 176*   COPD with chronic hypoxia at home on 3 to nasal cannula.  Currently needing BiPAP continue steroids antibiotic and inhalers  Hypothyroidism-continue Synthroid  Lower extremity swelling 2+ negative for DVT  GOC: Family wants intubation.  Patient was in hospice care prior to getting admitted.  Overall prognosis is guarded to poor.  Will benefit with palliative care consultation.  I discussed with patient daughter power of attorney at the bedside she states her daughter how  is a nurse stated that she wanted her to be intubated if needed.  Palliative care consulted.  Daughter is not decided. I did explain her about the guarded prognosis about multiple medical issues that she is having.   Nutrition: Diet Order             DIET - DYS 1 Room service appropriate? Yes; Fluid consistency: Nectar Thick  Diet effective now                 Body mass index is 24.52 kg/m.  DVT prophylaxis: heparin Code Status:   Code Status: Partial Code  Family Communication: plan of care discussed with patient at bedside.  Status is: Inpatient  Remains inpatient appropriate because:IV treatments appropriate due to intensity of illness or inability to take PO and Inpatient level of care appropriate due to severity of illness   Dispo: The patient is from: hospice              Anticipated d/c is to: TBD              Anticipated d/c date is: 3 days              Patient currently is not medically stable to d/c.         Consultants:see note  Procedures:see note  Culture/Microbiology    Component Value Date/Time   SDES IN/OUT CATH URINE 07/19/2020 1623   SPECREQUEST NONE 06/30/2020 1623   CULT (A) 07/28/2020 1623    >=100,000 COLONIES/mL VANCOMYCIN RESISTANT ENTEROCOCCUS >=100,000 COLONIES/mL GRANULICATELLA ADIACENS Standardized susceptibility testing for this organism is not available. Performed at [facility], [place] [address], [place],  [address]    REPTSTATUS 07/10/2020 FINAL 07/16/2020 1623    Other culture-see note  Medications: Scheduled Meds: . chlorhexidine      . chlorhexidine gluconate (MEDLINE KIT)  15 mL Mouth Rinse BID  . Chlorhexidine Gluconate Cloth  6 each Topical Daily  . docusate  100 mg Oral BID  . insulin aspart  0-9 Units Subcutaneous Q4H  . ipratropium  0.5 mg Nebulization Q6H  . levothyroxine  25 mcg Oral QAC breakfast  . methylPREDNISolone (SOLU-MEDROL) injection  20 mg Intravenous Daily  . mupirocin ointment  1 application Nasal BID  . pantoprazole (PROTONIX) IV  40 mg Intravenous Q12H  . polyethylene glycol  17 g Oral Daily  . Resource ThickenUp Clear   Oral Once   Continuous Infusions: . ceFEPime (MAXIPIME) IV Stopped (07/10/20 2244)  . diltiazem (CARDIZEM)  infusion 5 mg/hr (07/11/20 1308)  . heparin 900 Units/hr (07/11/20 1155)  . linezolid (ZYVOX) IV 300 mL/hr at 07/11/20 1000    Antimicrobials: Anti-infectives (From admission, onward)   Start     Dose/Rate Route Frequency Ordered Stop   07/10/20 1015  linezolid (ZYVOX) IVPB 600 mg        600 mg 300 mL/hr over 60 Minutes Intravenous Every 12 hours 07/10/20 0923 07/17/20 0959   07/09/20 0345  vancomycin (VANCOREADY) IVPB 750 mg/150 mL        750 mg 150 mL/hr over 60 Minutes Intravenous  Once 07/09/20 0250 07/09/20 0506   07/07/20 2300  ceFEPIme (MAXIPIME) 2 g in sodium chloride 0.9 % 100 mL IVPB        2 g 200 mL/hr over 30 Minutes Intravenous Every 24 hours 07/14/2020 2331 07/11/20 2359   07/19/2020 2331  vancomycin variable dose per unstable renal function (pharmacist dosing)  Status:  Discontinued  Does not apply See admin instructions 07/04/2020 2331 07/10/20 0923   07/03/2020 2330  vancomycin (VANCOREADY) IVPB 1250 mg/250 mL        1,250 mg 166.7 mL/hr over 90 Minutes Intravenous  Once 07/23/2020 2325 07/07/20 0317   07/15/2020 2330  ceFEPIme (MAXIPIME) 2 g in sodium chloride 0.9 % 100 mL IVPB        2 g 200 mL/hr over 30 Minutes Intravenous  Once 07/19/2020 2325 07/07/20 0024   07/16/2020 1600  cefTRIAXone (ROCEPHIN) 2 g in sodium chloride 0.9 % 100 mL IVPB  Status:  Discontinued        2 g 200 mL/hr over 30 Minutes Intravenous Every 24 hours 07/10/2020 1557 07/19/2020 2325   07/03/2020 1600  azithromycin (ZITHROMAX) 500 mg in sodium chloride 0.9 % 250 mL IVPB  Status:  Discontinued        500 mg 250 mL/hr over 60 Minutes Intravenous Every 24 hours 07/26/2020 1557 07/09/20 0807     Objective: Vitals: Today's Vitals   07/11/20 0800 07/11/20 0845 07/11/20 1015 07/11/20 1120  BP: (!) 164/89 (!) 164/89 (!) 153/113   Pulse: (!) 139 (!) 141 (!) 146   Resp: (!) 27 (!) 23 (!) 25   Temp: 98.3 F (36.8 C)     TempSrc: Oral     SpO2: 93% 97% 91% 94%  Weight:      Height:      PainSc: 0-No pain        Intake/Output Summary (Last 24 hours) at 07/11/2020 1406 Last data filed at 07/11/2020 1000 Gross per 24 hour  Intake 801.92 ml  Output 350 ml  Net 451.92 ml   Filed Weights   07/02/2020 2318 07/08/20 1830 07/09/20 0600  Weight: 59 kg 59 kg 64.8 kg   Weight change:   Intake/Output from previous day: 01/12 0701 - 01/13 0700 In: 645.1 [I.V.:236; IV Piggyback:409.1] Out: 1050 [Urine:1050] Intake/Output this shift: Total I/O In: 214.4 [I.V.:27; IV Piggyback:187.4] Out: -   Examination: General exam: Somnolent, oriented x1 ,NAD, weak appearing. HEENT:Oral mucosa moist, Ear/Nose WNL grossly,dentition normal. Respiratory system: bilaterally mild wheezing diminished, using accessory MUSCLES Cardiovascular system: S1 & S2 +, regular, No JVD. Gastrointestinal system: Abdomen soft, NT,ND, BS+. Nervous System:Alert, awake, moving extremities and grossly nonfocal Extremities: MILD edema, distal peripheral pulses palpable.  Skin: No rashes,no icterus. MSK: Normal muscle bulk,tone, power  Data Reviewed: I have personally reviewed following labs and imaging studies CBC: Recent Labs  Lab 07/07/20 0510 07/08/20 0137 07/08/20 1828 07/08/20 2023 07/09/20 0048 07/10/20 0037 07/11/20 0556  WBC 13.4* 16.1* 13.7*  --  11.3* 9.5 14.1*  NEUTROABS 11.4*  --   --   --   --   --   --   HGB 10.0* 10.0* 9.9* 10.5* 10.0* 10.3* 11.0*  HCT 34.3* 33.7* 32.8* 31.0* 33.4* 35.8* 36.6  MCV 94.5 92.8 92.1  --  89.5 92.5 88.2  PLT 221 256 269  --  252 256 314   Basic Metabolic Panel: Recent Labs  Lab 07/07/20 0510 07/08/20 0137 07/08/20 1828 07/08/20 2023 07/09/20 0048 07/09/20 0920 07/10/20 0037 07/11/20 0556  NA 132* 128* 130* 129*  --  130* 133* 134*  K 6.3* 6.0* 5.8* 5.6*  --  5.0 5.3* 4.5  CL 97* 95* 98  --   --  99 101 102  CO2 23 20* 17*  --   --  20* 20* 19*  GLUCOSE 127* 149* 231*  --   --  121* 149*  84  BUN 49* 63* 67*  --   --  68* 68* 63*  CREATININE 2.18* 2.22* 2.23*  --    --  1.89* 1.83* 1.61*  CALCIUM 8.7* 8.3* 7.9*  --   --  8.2* 8.4* 8.7*  MG 2.1  --   --   --  2.1 2.1 2.2  --   PHOS 5.9*  --   --   --  4.0  --  4.2  --    GFR: Estimated Creatinine Clearance: 21.7 mL/min (A) (by C-G formula based on SCr of 1.61 mg/dL (H)). Liver Function Tests: Recent Labs  Lab 07/02/2020 1555 07/07/20 0510 07/08/20 1828  AST 85* 86* 122*  ALT 50* 61* 100*  ALKPHOS 131* 150* 168*  BILITOT 0.6 0.8 0.5  PROT 6.6 6.2* 5.8*  ALBUMIN 3.2* 2.8* 2.7*   No results for input(s): LIPASE, AMYLASE in the last 168 hours. No results for input(s): AMMONIA in the last 168 hours. Coagulation Profile: Recent Labs  Lab 07/27/2020 1555  INR 1.1   Cardiac Enzymes: Recent Labs  Lab 07/07/20 0004  CKTOTAL 17*   BNP (last 3 results) No results for input(s): PROBNP in the last 8760 hours. HbA1C: No results for input(s): HGBA1C in the last 72 hours. CBG: Recent Labs  Lab 07/10/20 1936 07/10/20 2315 07/11/20 0406 07/11/20 0847 07/11/20 1236  GLUCAP 118* 155* 83 93 176*   Lipid Profile: No results for input(s): CHOL, HDL, LDLCALC, TRIG, CHOLHDL, LDLDIRECT in the last 72 hours. Thyroid Function Tests: No results for input(s): TSH, T4TOTAL, FREET4, T3FREE, THYROIDAB in the last 72 hours. Anemia Panel: No results for input(s): VITAMINB12, FOLATE, FERRITIN, TIBC, IRON, RETICCTPCT in the last 72 hours. Sepsis Labs: Recent Labs  Lab 07/27/2020 0003 07/11/2020 1555 07/07/20 0004 07/07/20 0155 07/07/20 0510 07/08/20 0137  PROCALCITON  --   --  0.43  --  0.37 0.32  LATICACIDVEN 1.3 1.2  --  1.2  --   --     Recent Results (from the past 240 hour(s))  Resp Panel by RT-PCR (Flu A&B, Covid) Nasopharyngeal Swab     Status: None   Collection Time: 07/17/2020  3:07 PM   Specimen: Nasopharyngeal Swab; Nasopharyngeal(NP) swabs in vial transport medium  Result Value Ref Range Status   SARS Coronavirus 2 by RT PCR NEGATIVE NEGATIVE Final    Comment: (NOTE) SARS-CoV-2 target  nucleic acids are NOT DETECTED.  The SARS-CoV-2 RNA is generally detectable in upper respiratory specimens during the acute phase of infection. The lowest concentration of SARS-CoV-2 viral copies this assay can detect is 138 copies/mL. A negative result does not preclude SARS-Cov-2 infection and should not be used as the sole basis for treatment or other patient management decisions. A negative result may occur with  improper specimen collection/handling, submission of specimen other than nasopharyngeal swab, presence of viral mutation(s) within the areas targeted by this assay, and inadequate number of viral copies(<138 copies/mL). A negative result must be combined with clinical observations, patient history, and epidemiological information. The expected result is Negative.  Fact Sheet for Patients:  EntrepreneurPulse.com.au  Fact Sheet for Healthcare Providers:  IncredibleEmployment.be  This test is no t yet approved or cleared by the [place] FDA and  has been authorized for detection and/or diagnosis of SARS-CoV-2 by FDA under an Emergency Use Authorization (EUA). This EUA will remain  in effect (meaning this test can be used) for the duration of the COVID-19 declaration under Section 564(b)(1) of the Act, 21 U.S.C.section  360bbb-3(b)(1), unless the authorization is terminated  or revoked sooner.       Influenza A by PCR NEGATIVE NEGATIVE Final   Influenza B by PCR NEGATIVE NEGATIVE Final    Comment: (NOTE) The Xpert Xpress SARS-CoV-2/FLU/RSV plus assay is intended as an aid in the diagnosis of influenza from Nasopharyngeal swab specimens and should not be used as a sole basis for treatment. Nasal washings and aspirates are unacceptable for Xpert Xpress SARS-CoV-2/FLU/RSV testing.  Fact Sheet for Patients: EntrepreneurPulse.com.au  Fact Sheet for Healthcare  Providers: IncredibleEmployment.be  This test is not yet approved or cleared by the [place] FDA and has been authorized for detection and/or diagnosis of SARS-CoV-2 by FDA under an Emergency Use Authorization (EUA). This EUA will remain in effect (meaning this test can be used) for the duration of the COVID-19 declaration under Section 564(b)(1) of the Act, 21 U.S.C. section 360bbb-3(b)(1), unless the authorization is terminated or revoked.  Performed at [facility], [place] [address] [place], [place] [address]   Blood culture (routine single)     Status: None   Collection Time: 07/27/2020  3:55 PM   Specimen: BLOOD RIGHT FOREARM  Result Value Ref Range Status   Specimen Description BLOOD RIGHT FOREARM  Final   Special Requests   Final    BOTTLES DRAWN AEROBIC AND ANAEROBIC Blood Culture results may not be optimal due to an excessive volume of blood received in culture bottles   Culture   Final    NO GROWTH 5 DAYS Performed at [facility], [place] [address] [place], [place] [address]    Report Status 07/11/2020 FINAL  Final  Culture, blood (single)     Status: None   Collection Time: 07/03/2020  3:59 PM   Specimen: BLOOD  Result Value Ref Range Status   Specimen Description BLOOD LEFT ANTECUBITAL  Final   Special Requests   Final    BOTTLES DRAWN AEROBIC AND ANAEROBIC Blood Culture adequate volume   Culture   Final    NO GROWTH 5 DAYS Performed at [facility], [place] [address], [place], [place] [address]    Report Status 07/11/2020 FINAL  Final  Urine culture     Status: Abnormal   Collection Time: 07/16/2020  4:23 PM   Specimen: In/Out Cath Urine  Result Value Ref Range Status   Specimen Description IN/OUT CATH URINE  Final   Special Requests NONE  Final   Culture (A)  Final    >=100,000 COLONIES/mL VANCOMYCIN RESISTANT ENTEROCOCCUS >=100,000 COLONIES/mL GRANULICATELLA ADIACENS Standardized susceptibility testing for this organism is  not available. Performed at [facility], [place] [address] [place], [place] [address]    Report Status 07/10/2020 FINAL  Final   Organism ID, Bacteria VANCOMYCIN RESISTANT ENTEROCOCCUS (A)  Final      Susceptibility   Vancomycin resistant enterococcus - MIC*    AMPICILLIN >=32 RESISTANT Resistant     NITROFURANTOIN 32 SENSITIVE Sensitive     VANCOMYCIN >=32 RESISTANT Resistant     LINEZOLID 2 SENSITIVE Sensitive     * >=100,000 COLONIES/mL VANCOMYCIN RESISTANT ENTEROCOCCUS  MRSA PCR Screening     Status: Abnormal   Collection Time: 07/09/20  4:16 AM   Specimen: Nasal Mucosa; Nasopharyngeal  Result Value Ref Range Status   MRSA by PCR POSITIVE (A) NEGATIVE Final    Comment:        The GeneXpert MRSA Assay (FDA approved for NASAL specimens only), is one component of a  comprehensive MRSA colonization surveillance program. It is not intended to diagnose MRSA infection nor to guide or monitor treatment for MRSA infections. RESULT CALLED TO, READ BACK BY AND VERIFIED WITH: A CAIN RN 07/09/20 6073 JDW Performed at [facility], [place] [address], [place], [place] [address]      Radiology Studies: No results found.   LOS: 5 days   Antonieta Pert, MD [facility]  07/11/2020, 2:06 PM

## 2020-07-11 NOTE — Progress Notes (Deleted)
Dressing in place.

## 2020-07-11 NOTE — TOC Progression Note (Signed)
Transition of Care [facility]) - Progression Note    Patient Details  Name: Hannah [name] MRN: [id number] Date of Birth: May 05, 1932  Transition of Care [facility]) CM/SW [place], [place] Phone Number: 07/11/2020, 5:08 PM  Clinical Narrative:     CSW spoke with Rex patients son and he has decided that thim and his sister wants patient to come home when medically ready. Patients son wants to have home health services set up for patient. Rex said patient will have 24/7 supervision by either him or his sister. CSW let patients son know that casemanager will be back in touch to help set patient up with home health services.  CSW will continue to follow.    Expected Discharge Plan: [place] Barriers to Discharge: Continued Medical Work up  Expected Discharge Plan and Services Expected Discharge Plan: [place] In-house Referral: Clinical Social Work     Living arrangements for the past 2 months: [place]                                       Social Determinants of Health (SDOH) Interventions    Readmission Risk Interventions No flowsheet data found.

## 2020-07-11 NOTE — NC FL2 (Signed)
[place] MEDICAID FL2 LEVEL OF CARE SCREENING TOOL     IDENTIFICATION  Patient Name: Hannah [name] Birthdate: 1932/04/22 Sex: female Admission Date (Current Location): 07/20/2020  [facility] and [place] Number:  [employer] and Address:  [facility], [place] [address] [place],  [address]      Provider Number: [id number]  Attending Physician Name and Address:  Antonieta Pert, MD  Relative Name and Phone Number:  Rex [phone]    Current Level of Care: Hospital Recommended Level of Care: [place] Prior Approval Number:    Date Approved/Denied:   PASRR Number: [id number]  Discharge Plan: SNF    Current Diagnoses: Patient Active Problem List   Diagnosis Date Noted  . Urinary tract infection without hematuria   . COPD (chronic obstructive pulmonary disease) ([place]) 07/07/2020  . Multifocal pneumonia 07/07/2020  . Acute on chronic respiratory failure with hypoxia and hypercapnia ([place]) 07/07/2020  . Atrial fibrillation with RVR ([place]) 07/07/2020  . GERD (gastroesophageal reflux disease) 07/07/2020  . DM2 (diabetes mellitus, type 2) ([place]) 07/07/2020  . Essential hypertension 07/07/2020  . CKD (chronic kidney disease), stage III ([place]) 07/07/2020  . Type 2 diabetes mellitus with hyperlipidemia ([place]) 07/07/2020  . Dysphagia   . Acute respiratory failure with hypoxia ([place])   . Septic shock ([place]) 07/13/2020    Orientation RESPIRATION BLADDER Height & Weight     Self,Time,Situation,Place  O2 (Nasal Cannula 4 liters) Incontinent,External catheter (External Urinary Catheter) Weight: 142 lb 13.7 oz (64.8 kg) Height:  _0  (162.6 cm)  BEHAVIORAL SYMPTOMS/MOOD NEUROLOGICAL BOWEL NUTRITION STATUS      Incontinent Diet (please see discharge summary)  AMBULATORY STATUS COMMUNICATION OF NEEDS Skin   Limited Assist Verbally Other (Comment) (Ecchymosis arm bilateral)                       Personal Care Assistance Level of  Assistance  Bathing,Feeding,Dressing Bathing Assistance: Limited assistance Feeding assistance: Maximum assistance Dressing Assistance: Limited assistance     Functional Limitations Info  Sight,Hearing,Speech Sight Info: Adequate Hearing Info: Impaired Speech Info: Adequate    SPECIAL CARE FACTORS FREQUENCY  PT (By licensed PT),OT (By licensed OT)     PT Frequency: 5x min weekly OT Frequency: 5x min weekly            Contractures Contractures Info: Not present    Additional Factors Info  Code Status,Allergies,Insulin Sliding Scale Code Status Info: Full Allergies Info: Codeine,Sulfa Antibiotics   Insulin Sliding Scale Info: insulin aspart (novoLOG) injection 0-9 Units every 4 hours       Current Medications (07/11/2020):  This is the current hospital active medication list Current Facility-Administered Medications  Medication Dose Route Frequency Provider Last Rate Last Admin  . acetaminophen (TYLENOL) tablet 650 mg  650 mg Oral Q6H PRN Toy Baker, MD   650 mg at 07/11/20 4268   Or  . acetaminophen (TYLENOL) suppository 650 mg  650 mg Rectal Q6H PRN Doutova, Anastassia, MD      . albuterol (PROVENTIL) (2.5 MG/3ML) 0.083% nebulizer solution 2.5 mg  2.5 mg Nebulization Q2H PRN Doutova, Anastassia, MD   2.5 mg at 07/11/20 1103  . ceFEPIme (MAXIPIME) 2 g in sodium chloride 0.9 % 100 mL IVPB  2 g Intravenous Q24H Audria Nine, DO   Stopped at 07/10/20 2244  . chlorhexidine (PERIDEX) 0.12 % solution           . chlorhexidine gluconate (MEDLINE KIT) (PERIDEX) 0.12 %  solution 15 mL  15 mL Mouth Rinse BID Audria Nine, DO   15 mL at 07/11/20 0912  . Chlorhexidine Gluconate Cloth 2 % PADS 6 each  6 each Topical Daily Audria Nine, DO   6 each at 07/10/20 2350  . diltiazem (CARDIZEM) 125 mg in dextrose 5% 125 mL (1 mg/mL) infusion  5-15 mg/hr Intravenous Continuous Kc, Ramesh, MD 10 mL/hr at 07/11/20 1500 10 mg/hr at 07/11/20 1500  . docusate (COLACE) 50  MG/5ML liquid 100 mg  100 mg Oral BID Darlina Sicilian, RPH   100 mg at 07/10/20 2100  . haloperidol lactate (HALDOL) injection 2 mg  2 mg Intravenous Q6H PRN Audria Nine, DO   2 mg at 07/11/20 0012  . heparin ADULT infusion 100 units/mL (25000 units/22m)  900 Units/hr Intravenous Continuous BLaren Everts RPH 9 mL/hr at 07/11/20 1500 900 Units/hr at 07/11/20 1500  . insulin aspart (novoLOG) injection 0-9 Units  0-9 Units Subcutaneous Q4H DToy Baker MD   2 Units at 07/11/20 1610  . ipratropium (ATROVENT) nebulizer solution 0.5 mg  0.5 mg Nebulization Q6H Doutova, Anastassia, MD   0.5 mg at 07/11/20 1355  . levothyroxine (SYNTHROID) tablet 25 mcg  25 mcg Oral QAC breakfast GDarlina Sicilian RPH   25 mcg at 07/11/20 02992 . linezolid (ZYVOX) IVPB 600 mg  600 mg Intravenous Q12H MAudria Nine DO   Stopped at 07/11/20 1022  . methylPREDNISolone sodium succinate (SOLU-MEDROL) 40 mg/mL injection 20 mg  20 mg Intravenous Daily Desai, Rahul P, PA-C   20 mg at 07/11/20 0913  . metoprolol tartrate (LOPRESSOR) injection 2.5 mg  2.5 mg Intravenous Q4H PRN MAudria Nine DO   2.5 mg at 07/11/20 1015  . mupirocin ointment (BACTROBAN) 2 % 1 application  1 application Nasal BID MAudria Nine DO   1 application at [id number] . pantoprazole (PROTONIX) injection 40 mg  40 mg Intravenous Q12H LEllouise [place]  40 mg at 07/11/20 0913  . polyethylene glycol (MIRALAX / GLYCOLAX) packet 17 g  17 g Oral Daily GDarlina Sicilian RPH      . Racepinephrine HCl 2.25 % nebulizer solution 0.5 mL  0.5 mL Nebulization Q1H PRN MAudria Nine DO   0.5 mL at 07/10/20 1359  . Resource ThickenUp Clear   Oral Once MAudria Nine DO         Discharge Medications: Please see discharge summary for a list of discharge medications.  Relevant Imaging Results:  Relevant Lab Results:   Additional Information SSN [id number] ATrula Ore LCSWA

## 2020-07-12 ENCOUNTER — Inpatient Hospital Stay (HOSPITAL_COMMUNITY)

## 2020-07-12 DIAGNOSIS — J9601 Acute respiratory failure with hypoxia: Secondary | ICD-10-CM | POA: Diagnosis not present

## 2020-07-12 DIAGNOSIS — J9622 Acute and chronic respiratory failure with hypercapnia: Secondary | ICD-10-CM

## 2020-07-12 DIAGNOSIS — J9621 Acute and chronic respiratory failure with hypoxia: Secondary | ICD-10-CM

## 2020-07-12 DIAGNOSIS — N3000 Acute cystitis without hematuria: Secondary | ICD-10-CM

## 2020-07-12 LAB — CBC
HCT: 34.9 % — ABNORMAL LOW (ref 36.0–46.0)
Hemoglobin: 10.7 g/dL — ABNORMAL LOW (ref 12.0–15.0)
MCH: 27.5 pg (ref 26.0–34.0)
MCHC: 30.7 g/dL (ref 30.0–36.0)
MCV: 89.7 fL (ref 80.0–100.0)
Platelets: 256 10*3/uL (ref 150–400)
RBC: 3.89 MIL/uL (ref 3.87–5.11)
RDW: 17.2 % — ABNORMAL HIGH (ref 11.5–15.5)
WBC: 13.1 10*3/uL — ABNORMAL HIGH (ref 4.0–10.5)
nRBC: 0.3 % — ABNORMAL HIGH (ref 0.0–0.2)

## 2020-07-12 LAB — BASIC METABOLIC PANEL
Anion gap: 14 (ref 5–15)
BUN: 57 mg/dL — ABNORMAL HIGH (ref 8–23)
CO2: 18 mmol/L — ABNORMAL LOW (ref 22–32)
Calcium: 8.7 mg/dL — ABNORMAL LOW (ref 8.9–10.3)
Chloride: 103 mmol/L (ref 98–111)
Creatinine, Ser: 1.47 mg/dL — ABNORMAL HIGH (ref 0.44–1.00)
GFR, Estimated: 35 mL/min — ABNORMAL LOW (ref >60)
Glucose, Bld: 160 mg/dL — ABNORMAL HIGH (ref 70–99)
Potassium: 4.6 mmol/L (ref 3.5–5.1)
Sodium: 135 mmol/L (ref 135–145)

## 2020-07-12 LAB — GLUCOSE, CAPILLARY
Glucose-Capillary: 115 mg/dL — ABNORMAL HIGH (ref 70–99)
Glucose-Capillary: 117 mg/dL — ABNORMAL HIGH (ref 70–99)
Glucose-Capillary: 128 mg/dL — ABNORMAL HIGH (ref 70–99)
Glucose-Capillary: 168 mg/dL — ABNORMAL HIGH (ref 70–99)
Glucose-Capillary: 174 mg/dL — ABNORMAL HIGH (ref 70–99)
Glucose-Capillary: 199 mg/dL — ABNORMAL HIGH (ref 70–99)
Glucose-Capillary: 96 mg/dL (ref 70–99)

## 2020-07-12 LAB — MAGNESIUM: Magnesium: 2 mg/dL (ref 1.7–2.4)

## 2020-07-12 LAB — PHOSPHORUS: Phosphorus: 2.4 mg/dL — ABNORMAL LOW (ref 2.5–4.6)

## 2020-07-12 LAB — HEPARIN LEVEL (UNFRACTIONATED): Heparin Unfractionated: 0.41 IU/mL (ref 0.30–0.70)

## 2020-07-12 MED ORDER — APIXABAN 2.5 MG PO TABS: 2.5000 mg | ORAL_TABLET | Freq: Two times a day (BID) | ORAL | Status: DC

## 2020-07-12 MED ORDER — FUROSEMIDE 10 MG/ML IJ SOLN
40.0000 mg | Freq: Four times a day (QID) | INTRAMUSCULAR | Status: AC
  Administered 2020-07-12 (×2): 40 mg via INTRAVENOUS
  Filled 2020-07-12 (×2): qty 4

## 2020-07-12 MED ORDER — HEPARIN (PORCINE) 25000 UT/250ML-% IV SOLN
900.0000 [IU]/h | INTRAVENOUS | Status: DC
  Administered 2020-07-12: 900 [IU]/h via INTRAVENOUS
  Filled 2020-07-12 (×3): qty 250

## 2020-07-12 MED ORDER — OSMOLITE 1.2 CAL PO LIQD
1000.0000 mL | ORAL | Status: DC
  Administered 2020-07-12 – 2020-07-16 (×5): 1000 mL
  Filled 2020-07-12 (×9): qty 1000

## 2020-07-12 NOTE — Progress Notes (Signed)
PROGRESS NOTE    Hannah [name]  ZES:[id number] DOB: 04-23-1932 DOA: 07/10/2020 PCP: Street, [name], MD   Chief Complaint  Patient presents with  . Shortness of Breath   Brief Narrative: 87 y.o. female with medical history significant of DM2, COPD on 3L at baseline, GERD, HLD, HTN, dementia, wheelchair-bound who was enrolled in hospice of [name] prior to admission who was recently diagnosed with pneumonia and was started on antibiotics on day of admission brought to the ED  06/29/2020 with hypoxia 79% RA, and was admitted for septic shock due to CAP, new onset of A.fib, and had desaturation event to the point of near arrest on 1/30 CODE BLUE called intubated and transferred to ICU. Significant events: 1/8 admitted 1/9 SLP eval, mod aspiration risk 1/9 GI consult for dysphagia > barium esophogram ordered before deciding on next steps (? EGD, dilation for stricture etc). 1/10 PT eval > SNF recommended 1/10-intubated 1/11-extubated to [facility] 1/13- tx to [facility], in resp distress needing bipap, persistent A. fib placed on Cardizem drip, seen by cardiology  Subjective: Overnight on BiPAP, transitioned to nasal cannula.  Afebrile.   WBC slightly downtrending 13.1 Creatinine improving 1.4 Confused, frail, ill looking Hannah [name] to whisper her name to me  Assessment & Plan:  Acute on chronic hypoxic respiratory failure, baseline on 3 L, multifactorial with weakness, dysphagia,with baseline COPD: Extubated 1/11, was placed on BiPAP due to respiratory status 11/13, chest x-ray with bilateral pleural effusions rt>lt left with passive atelectasis slightly increased from 2 days prior, pccm dosing lasix iv. Cont Zyvox for VRE UTI.  Continue pulmonary support, pulmonary care as per PCCM on board.  BiPAP as needed, continue supplemental oxygen.    Acute metabolic encephalopathy likely multifactorial with failure to thrive, delirium hospital-acquired in the setting of dementia: weak, frail, cont  supportive  measures.  Unsafe for p.o. intake, and plan for cortrak tube feeding if family wants to proceed  New onset A. fib with RVR/history of systolic CHF with EF 50 to 55%/hyperlipidemia: Rate controlled with Cardizem drip.  Appreciate cardiology input. On heparin GTT, asa.  Poor candidate for long-term anticoagulation due to fall risk/tendency.  Sepsis due to VRE UTI on Zyvox x7 days starting 1/12.  Leukocytosis downtrending, afebrile.  Dysphagia  with narrowing of distal esophagus just above GE junction in barium swallow, history of esophageal strictures needing dilation in the past.  Seen by GI and signed off.  Speech following.will likely need CORTRAK for feed, if family wants to proceed.  AKI on CKD??  Baseline/with metabolic acidosis: Creatinine peaked at 2.2, creatinine slowly improving.  Monitor closely.  Recent Labs  Lab 07/08/20 1828 07/09/20 0920 07/10/20 0037 07/11/20 0556 07/12/20 0140  BUN 67* 68* 68* 63* 57*  CREATININE 2.23* 1.89* 1.83* 1.61* 1.47*   Hyperkalemia status post Kayexalate and Lokelma.  Potassium level stable Recent Labs  Lab 07/08/20 2023 07/09/20 0920 07/10/20 0037 07/11/20 0556 07/12/20 0140  K 5.6* 5.0 5.3* 4.5 4.6   History of diabetes mellitus hold home metformin,  A1c stable 5.3.  Blood sugars well controlled on sliding scale Recent Labs  Lab 07/11/20 1530 07/11/20 1919 07/12/20 0027 07/12/20 0345 07/12/20 0641  GLUCAP 160* 99 115* 128* 117*   COPD with chronic hypoxia at home on 3 to nasal cannula.  Currently needing BiPAP intermittently and also on Solu-Medrol continue per pulmonary.  Continue.    Hypothyroidism-continue Synthroid  Lower extremity swelling 2+ negative for DVT  GOC: Patient will be able to hospice candidate  but family wants to proceed with full ischemic treatment including intubation.  Palliative care meeting.  Pulmonary following closely.   Nutrition: Diet Order            DIET - DYS 1 Room service appropriate? Yes;  Fluid consistency: Nectar Thick  Diet effective now                 Body mass index is 24.52 kg/m.  DVT prophylaxis: heparin Code Status:   Code Status: Partial Code  Family Communication: plan of care discussed with patient/S DAUGTER 1/13. No family at bedside today.  Status is: Inpatient  Remains inpatient appropriate because:IV treatments appropriate due to intensity of illness or inability to take PO and Inpatient level of care appropriate due to severity of illness  Dispo: The patient is from: hospice              Anticipated d/c is to: TBD              Anticipated d/c date is: 3 days              Patient currently is not medically stable to d/c.  Consultants:see note  Procedures:see note  Culture/Microbiology    Component Value Date/Time   SDES IN/OUT CATH URINE 07/02/2020 1623   SPECREQUEST NONE 07/14/2020 1623   CULT (A) 07/05/2020 1623    >=100,000 COLONIES/mL VANCOMYCIN RESISTANT ENTEROCOCCUS >=100,000 COLONIES/mL GRANULICATELLA ADIACENS Standardized susceptibility testing for this organism is not available. Performed at [facility], [place] [address] [place], [place] [address]    REPTSTATUS 07/10/2020 FINAL 07/21/2020 1623    Other culture-see note  Medications: Scheduled Meds: . chlorhexidine gluconate (MEDLINE KIT)  15 mL Mouth Rinse BID  . Chlorhexidine Gluconate Cloth  6 each Topical Daily  . docusate  100 mg Oral BID  . insulin aspart  0-9 Units Subcutaneous Q4H  . ipratropium  0.5 mg Nebulization Q6H  . levothyroxine  25 mcg Oral QAC breakfast  . methylPREDNISolone (SOLU-MEDROL) injection  20 mg Intravenous Daily  . mupirocin ointment  1 application Nasal BID  . pantoprazole (PROTONIX) IV  40 mg Intravenous Q12H  . polyethylene glycol  17 g Oral Daily  . Resource ThickenUp Clear   Oral Once   Continuous Infusions: . diltiazem (CARDIZEM) infusion 5 mg/hr (07/11/20 2358)  . heparin 900 Units/hr (07/12/20 0343)  . linezolid (ZYVOX) IV 600  mg (07/11/20 2355)    Antimicrobials: Anti-infectives (From admission, onward)   Start     Dose/Rate Route Frequency Ordered Stop   07/10/20 1015  linezolid (ZYVOX) IVPB 600 mg        600 mg 300 mL/hr over 60 Minutes Intravenous Every 12 hours 07/10/20 0923 07/17/20 0959   07/09/20 0345  vancomycin (VANCOREADY) IVPB 750 mg/150 mL        750 mg 150 mL/hr over 60 Minutes Intravenous  Once 07/09/20 0250 07/09/20 0506   07/07/20 2300  ceFEPIme (MAXIPIME) 2 g in sodium chloride 0.9 % 100 mL IVPB        2 g 200 mL/hr over 30 Minutes Intravenous Every 24 hours 07/15/2020 2331 07/12/20 0633   07/08/2020 2331  vancomycin variable dose per unstable renal function (pharmacist dosing)  Status:  Discontinued         Does not apply See admin instructions 07/27/2020 2331 07/10/20 0923   07/14/2020 2330  vancomycin (VANCOREADY) IVPB 1250 mg/250 mL        1,250 mg 166.7 mL/hr over 90 Minutes  Intravenous  Once 07/20/2020 2325 07/07/20 0317   07/24/2020 2330  ceFEPIme (MAXIPIME) 2 g in sodium chloride 0.9 % 100 mL IVPB        2 g 200 mL/hr over 30 Minutes Intravenous  Once 06/30/2020 2325 07/07/20 0024   07/19/2020 1600  cefTRIAXone (ROCEPHIN) 2 g in sodium chloride 0.9 % 100 mL IVPB  Status:  Discontinued        2 g 200 mL/hr over 30 Minutes Intravenous Every 24 hours 07/09/2020 1557 07/04/2020 2325   07/04/2020 1600  azithromycin (ZITHROMAX) 500 mg in sodium chloride 0.9 % 250 mL IVPB  Status:  Discontinued        500 mg 250 mL/hr over 60 Minutes Intravenous Every 24 hours 07/10/2020 1557 07/09/20 0807     Objective: Vitals: Today's Vitals   07/12/20 0615 07/12/20 0630 07/12/20 0645 07/12/20 0700  BP:    (!) 150/74  Pulse: 100 88 85 (!) 104  Resp: (!) 21 19 (!) 24 (!) 21  Temp:      TempSrc:      SpO2: 100% 100% 100% 100%  Weight:      Height:      PainSc:        Intake/Output Summary (Last 24 hours) at 07/12/2020 0715 Last data filed at 07/12/2020 0500 Gross per 24 hour  Intake 546.82 ml  Output 1200 ml   Net -653.18 ml   Filed Weights   07/03/2020 2318 07/08/20 1830 07/09/20 0600  Weight: 59 kg 59 kg 64.8 kg   Weight change:   Intake/Output from previous day: 01/13 0701 - 01/14 0700 In: 546.8 [I.V.:246.8; IV Piggyback:300] Out: 1200 [Urine:1200] Intake/Output this shift: No intake/output data recorded.  Examination: General exam: Cachectic, somnolent frail  HEENT:Oral mucosa moist,Ear/Nose WNL grossly, dentition normal. Respiratory system: bilaterally diminished Cardiovascular system: S1 & S2 +, No JVD,. Gastrointestinal system: Abdomen soft, NT,ND, BS+ Nervous System: Somnolent , mits on Extremities: No edema, distal peripheral pulses palpable.  Skin: No rashes,no icterus. MSK: Normal muscle bulk,tone, power  Data Reviewed: I have personally reviewed following labs and imaging studies CBC: Recent Labs  Lab 07/07/20 0510 07/08/20 0137 07/08/20 1828 07/08/20 2023 07/09/20 0048 07/10/20 0037 07/11/20 0556 07/12/20 0140  WBC 13.4*   < > 13.7*  --  11.3* 9.5 14.1* 13.1*  NEUTROABS 11.4*  --   --   --   --   --   --   --   HGB 10.0*   < > 9.9* 10.5* 10.0* 10.3* 11.0* 10.7*  HCT 34.3*   < > 32.8* 31.0* 33.4* 35.8* 36.6 34.9*  MCV 94.5   < > 92.1  --  89.5 92.5 88.2 89.7  PLT 221   < > 269  --  252 256 314 256   < > = values in this interval not displayed.   Basic Metabolic Panel: Recent Labs  Lab 07/07/20 0510 07/08/20 0137 07/08/20 1828 07/08/20 2023 07/09/20 0048 07/09/20 0920 07/10/20 0037 07/11/20 0556 07/12/20 0140  NA 132*   < > 130* 129*  --  130* 133* 134* 135  K 6.3*   < > 5.8* 5.6*  --  5.0 5.3* 4.5 4.6  CL 97*   < > 98  --   --  99 101 102 103  CO2 23   < > 17*  --   --  20* 20* 19* 18*  GLUCOSE 127*   < > 231*  --   --  121* 149* 84 160*  BUN 49*   < > 67*  --   --  68* 68* 63* 57*  CREATININE 2.18*   < > 2.23*  --   --  1.89* 1.83* 1.61* 1.47*  CALCIUM 8.7*   < > 7.9*  --   --  8.2* 8.4* 8.7* 8.7*  MG 2.1  --   --   --  2.1 2.1 2.2  --   --    PHOS 5.9*  --   --   --  4.0  --  4.2  --   --    < > = values in this interval not displayed.   GFR: Estimated Creatinine Clearance: 23.7 mL/min (A) (by C-G formula based on SCr of 1.47 mg/dL (H)). Liver Function Tests: Recent Labs  Lab 07/27/2020 1555 07/07/20 0510 07/08/20 1828  AST 85* 86* 122*  ALT 50* 61* 100*  ALKPHOS 131* 150* 168*  BILITOT 0.6 0.8 0.5  PROT 6.6 6.2* 5.8*  ALBUMIN 3.2* 2.8* 2.7*   No results for input(s): LIPASE, AMYLASE in the last 168 hours. No results for input(s): AMMONIA in the last 168 hours. Coagulation Profile: Recent Labs  Lab 07/09/2020 1555  INR 1.1   Cardiac Enzymes: Recent Labs  Lab 07/07/20 0004  CKTOTAL 17*   BNP (last 3 results) No results for input(s): PROBNP in the last 8760 hours. HbA1C: No results for input(s): HGBA1C in the last 72 hours. CBG: Recent Labs  Lab 07/11/20 1530 07/11/20 1919 07/12/20 0027 07/12/20 0345 07/12/20 0641  GLUCAP 160* 99 115* 128* 117*   Lipid Profile: No results for input(s): CHOL, HDL, LDLCALC, TRIG, CHOLHDL, LDLDIRECT in the last 72 hours. Thyroid Function Tests: No results for input(s): TSH, T4TOTAL, FREET4, T3FREE, THYROIDAB in the last 72 hours. Anemia Panel: No results for input(s): VITAMINB12, FOLATE, FERRITIN, TIBC, IRON, RETICCTPCT in the last 72 hours. Sepsis Labs: Recent Labs  Lab 07/05/2020 0003 07/25/2020 1555 07/07/20 0004 07/07/20 0155 07/07/20 0510 07/08/20 0137  PROCALCITON  --   --  0.43  --  0.37 0.32  LATICACIDVEN 1.3 1.2  --  1.2  --   --     Recent Results (from the past 240 hour(s))  Resp Panel by RT-PCR (Flu A&B, Covid) Nasopharyngeal Swab     Status: None   Collection Time: 07/13/2020  3:07 PM   Specimen: Nasopharyngeal Swab; Nasopharyngeal(NP) swabs in vial transport medium  Result Value Ref Range Status   SARS Coronavirus 2 by RT PCR NEGATIVE NEGATIVE Final    Comment: (NOTE) SARS-CoV-2 target nucleic acids are NOT DETECTED.  The SARS-CoV-2 RNA is  generally detectable in upper respiratory specimens during the acute phase of infection. The lowest concentration of SARS-CoV-2 viral copies this assay can detect is 138 copies/mL. A negative result does not preclude SARS-Cov-2 infection and should not be used as the sole basis for treatment or other patient management decisions. A negative result may occur with  improper specimen collection/handling, submission of specimen other than nasopharyngeal swab, presence of viral mutation(s) within the areas targeted by this assay, and inadequate number of viral copies(<138 copies/mL). A negative result must be combined with clinical observations, patient history, and epidemiological information. The expected result is Negative.  Fact Sheet for Patients:  EntrepreneurPulse.com.au  Fact Sheet for Healthcare Providers:  IncredibleEmployment.be  This test is no t yet approved or cleared by the [place] FDA and  has been authorized for detection and/or diagnosis of SARS-CoV-2 by FDA under an Emergency Use Authorization (EUA). This  EUA will remain  in effect (meaning this test can be used) for the duration of the COVID-19 declaration under Section 564(b)(1) of the Act, 21 U.S.C.section 360bbb-3(b)(1), unless the authorization is terminated  or revoked sooner.       Influenza A by PCR NEGATIVE NEGATIVE Final   Influenza B by PCR NEGATIVE NEGATIVE Final    Comment: (NOTE) The Xpert Xpress SARS-CoV-2/FLU/RSV plus assay is intended as an aid in the diagnosis of influenza from Nasopharyngeal swab specimens and should not be used as a sole basis for treatment. Nasal washings and aspirates are unacceptable for Xpert Xpress SARS-CoV-2/FLU/RSV testing.  Fact Sheet for Patients: EntrepreneurPulse.com.au  Fact Sheet for Healthcare Providers: IncredibleEmployment.be  This test is not yet approved or cleared by the [place] FDA and has been authorized for detection and/or diagnosis of SARS-CoV-2 by FDA under an Emergency Use Authorization (EUA). This EUA will remain in effect (meaning this test can be used) for the duration of the COVID-19 declaration under Section 564(b)(1) of the Act, 21 U.S.C. section 360bbb-3(b)(1), unless the authorization is terminated or revoked.  Performed at [facility], [place] [address], [place], [place] [address]   Blood culture (routine single)     Status: None   Collection Time: 07/21/2020  3:55 PM   Specimen: BLOOD RIGHT FOREARM  Result Value Ref Range Status   Specimen Description BLOOD RIGHT FOREARM  Final   Special Requests   Final    BOTTLES DRAWN AEROBIC AND ANAEROBIC Blood Culture results may not be optimal due to an excessive volume of blood received in culture bottles   Culture   Final    NO GROWTH 5 DAYS Performed at [facility], [place] [address], [place], [place] [address]    Report Status 07/11/2020 FINAL  Final  Culture, blood (single)     Status: None   Collection Time: 07/10/2020  3:59 PM   Specimen: BLOOD  Result Value Ref Range Status   Specimen Description BLOOD LEFT ANTECUBITAL  Final   Special Requests   Final    BOTTLES DRAWN AEROBIC AND ANAEROBIC Blood Culture adequate volume   Culture   Final    NO GROWTH 5 DAYS Performed at [facility], [place] [address], [place], [place] [address]    Report Status 07/11/2020 FINAL  Final  Urine culture     Status: Abnormal   Collection Time: 07/29/2020  4:23 PM   Specimen: In/Out Cath Urine  Result Value Ref Range Status   Specimen Description IN/OUT CATH URINE  Final   Special Requests NONE  Final   Culture (A)  Final    >=100,000 COLONIES/mL VANCOMYCIN RESISTANT ENTEROCOCCUS >=100,000 COLONIES/mL GRANULICATELLA ADIACENS Standardized susceptibility testing for this organism is not available. Performed at [facility], [place] [address] [place], [place] [address]    Report Status  07/10/2020 FINAL  Final   Organism ID, Bacteria VANCOMYCIN RESISTANT ENTEROCOCCUS (A)  Final      Susceptibility   Vancomycin resistant enterococcus - MIC*    AMPICILLIN >=32 RESISTANT Resistant     NITROFURANTOIN 32 SENSITIVE Sensitive     VANCOMYCIN >=32 RESISTANT Resistant     LINEZOLID 2 SENSITIVE Sensitive     * >=100,000 COLONIES/mL VANCOMYCIN RESISTANT ENTEROCOCCUS  MRSA PCR Screening     Status: Abnormal   Collection Time: 07/09/20  4:16 AM   Specimen: Nasal Mucosa; Nasopharyngeal  Result Value Ref Range Status   MRSA by PCR POSITIVE (A) NEGATIVE  Final    Comment:        The GeneXpert MRSA Assay (FDA approved for NASAL specimens only), is one component of a comprehensive MRSA colonization surveillance program. It is not intended to diagnose MRSA infection nor to guide or monitor treatment for MRSA infections. RESULT CALLED TO, READ BACK BY AND VERIFIED WITH: A CAIN RN 07/09/20 0940 JDW Performed at [facility], [place] [address], [place], [place] [address]      Radiology Studies: DG CHEST PORT 1 VIEW  Result Date: 07/11/2020 CLINICAL DATA:  Acute hypoxic respiratory failure.  Extubation. EXAM: PORTABLE CHEST 1 VIEW COMPARISON:  07/09/2020 and earlier. FINDINGS: Interval extubation. Cardiac silhouette upper normal in size, unchanged. Pulmonary venous hypertension without overt edema. BILATERAL pleural effusions, RIGHT greater than LEFT, and associated passive atelectasis in the lower lobes, slightly increased since the examination 2 days ago. Stable linear atelectasis in the LEFT mid lung. IMPRESSION: 1. BILATERAL pleural effusions, RIGHT greater than LEFT, and associated passive atelectasis involving the lower lobes, slightly increased since the examination 2 days ago. 2. Developing pulmonary venous hypertension without overt edema. Electronically Signed   By: Evangeline Dakin M.D.   On: 07/11/2020 15:56     LOS: 6 days   Antonieta Pert, MD [facility]  07/12/2020, 7:15 AM

## 2020-07-12 NOTE — Progress Notes (Addendum)
Modified Barium Swallow Progress Note  Patient Details  Name: LESSLY STIGLER MRN: [id number] Date of Birth: July 13, 1931  Today's Date: 07/12/2020  Modified Barium Swallow completed.  Full report located under Chart Review in the Imaging Section.  Brief recommendations include the following:  Clinical Impression  Pt presents with deteriorating swallow function marked by silent aspiration of nectar and honey-thick liquids just before onset of the pharyngeal swallow.  Laryngeal vestibule closure is complete, but pt demonstrates premature loss of material over the base of tongue and into the pharynx, leading to aspiration before the airway can close.  Boluses were given in teaspoon quantities, and aspiration was either silent or a cough occurred well after the aspiration event. Complicating matters is the retention of barium throughout the esophagus - after the study was completed, fluoroscopy was turned on one-minute and two-minutes post-study and esophageal column remained full of barium.  Pt is at high risk for aspirating duirng PO intake as well as after PO intake due to esophageal retention and backflow.  Prognosis for improvement is uncertain at this time.  RN present for study and we discussed results.   Addendum:  After study, visited pt's room and spoke with son, Rex, at length re: results.  We reviewed imaging from [facility] and discussed complicated issues - aspirating while eating/drinking due to delayed response time and poor sensation, compounded by esophageal retention and likely backflow and subsequent aspiration.  I conveyed that her swallowing issues are significant. Rex would still like to pursue NG for nutrition.  I told him I would f/u on Monday to see if pt demonstrates any improvement. He agreed with plan.   Swallow Evaluation Recommendations       SLP Diet Recommendations: NPO               Compensations: Slow rate;Small sips/bites       Oral Care Recommendations: Oral care  QID   Other Recommendations: Have oral suction available   Sandee Bernath L. Tivis Ringer, [place] Office number [phone] Pager [phone]  Juan Quam Laurice 07/12/2020,1:24 PM
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
## 2020-07-12 NOTE — Procedures (Signed)
Cortrak  Person Inserting Tube:  Miu Chiong E, RD Tube Type:  Cortrak - 43 inches Tube Location:  Left nare Initial Placement:  Stomach Secured by: Bridle Technique Used to Measure Tube Placement:  Documented cm marking at nare/ corner of mouth Cortrak Secured At:  60 cm    Cortrak Tube Team Note:  Consult received to place a Cortrak feeding tube.   No x-ray is required. RN may begin using tube.   If the tube becomes dislodged please keep the tube and contact the Cortrak team at [email or web address] (password [email or web address]) for replacement.  If after hours and replacement cannot be delayed, place a NG tube and confirm placement with an abdominal x-ray.    Hannah Reddix, MS, RD, LDN RD pager number and weekend/on-call pager number located in Amion.   

## 2020-07-12 NOTE — Progress Notes (Addendum)
  Speech Language Pathology Treatment: Dysphagia  Patient Details Name: Hannah [name] MRN: [id number] DOB: 1932/01/26 Today's Date: 07/12/2020 Time: 5956-3875 SLP Time Calculation (min) (ACUTE ONLY): 28 min  Assessment / Plan / Recommendation Clinical Impression  Pt presents with worsening signs of aspiration with all PO intake.  RN reports coughing with spoon amounts of thin liquid and difficulty taking medications.  Following SLP tx for the past 2 days, pt has had increasing work of breath within the hour and has required BiPAP.  Of note, pt has known hx of esophageal dysphagia (see below for results of BaSw). Pt initially eager for PO trials with SLP.  Pt did not exhibit oral holding with puree and appeared to tolerated pudding well. Today pt exhibited the most comfort with honey thick liquid by spoon. With cup sip pt required multiple swallows (4-5) and there was wet breath sounds.  With nectar thick liquid by spoon, pt required 2 swallows and there was delayed wet cough.  Pt appeared to exhibit discomfort with POs and began declining certain consistencies and presentations (for example, cup sips) and eventually refused all further PO trials. SLP cannot make a safe diet recommendation at this time, and I suspect pt would not be able to meet her nutritional needs orally.  Palliative in room talking with family during session and left to continue conversation. If PO feeding is desired with known risk of aspiration, recommend puree diet with honey thick liquid by spoon.    SLP has scheduled MBSS for later this date to determine if a safe oral diet exists and to help in decision making.    HPI HPI: 87 year old female from nursing home coming in with sepsis due to multifocal pneumonia and acute hypoxic respiratory failure. Clinical swallow evaluation was completed 1/09 with concerns for esophageal dysphagia, no real concerns for oropharyngeal dysphagia; full liquids recommended pending GI consult.  Evening of 1/10, she had desaturation to the point of near arrest.  Code blue called and pt intubated by anesthesia before being transferred to [facility] ICU. Per Dr. Eugenia Pancoast note 1/12:  Pt "had a smooth narrowing of her distal esophagus on Barium esophagram 2 days ago.  Ideally I would recommend EGD for further evaluation however she is at quite [place] for any invasive procedures: chronically on 3LNC, she has pneumonia now, she just suffered a respiratory arrest and her Baseline dementia obviously complicates the picture as well."      SLP Plan  MBS       Recommendations  Diet recommendations: NPO;  If continued po diet is desired with known risk of aspiration, recommend Dysphagia 1 (puree);Honey-thick liquid Liquids provided via: Teaspoon Medication Administration: via alternate means; or crushed with puree if po diet is to be continued with known risk of aspiration Supervision: Full supervision/cueing for compensatory strategies;Staff to assist with self feeding;Patient able to self feed Compensations: Slow rate;Small sips/bites Postural Changes and/or Swallow Maneuvers: Seated upright 90 degrees                Oral Care Recommendations: Oral care QID Follow up Recommendations: 24 hour supervision/assistance SLP Visit Diagnosis: Dysphagia, unspecified (R13.10) Plan: [place], [place], [place] Office: [phone]  07/12/2020, 10:38 AM

## 2020-07-12 NOTE — Progress Notes (Addendum)
NAME:  Hannah [name], MRN:  [id number], DOB:  09-17-1931, LOS: 6 ADMISSION DATE:  07/15/2020, CONSULTATION DATE: 07/25/2020 REFERRING MD: Dr. Dina Rich from ED, CHIEF COMPLAINT: Shortness of breath cough and altered mental status  Brief History:  87 year old female from nursing home coming in with sepsis due to multifocal pneumonia and acute hypoxic respiratory failure.\ Had near arrest evening 1/10 and required intubation and transfer to ICU.  History of Present Illness:  Patient is encephalopathic so most of the history is taken from medical records 87 year old female with COPD, diabetes and chronic systolic and diastolic congestive heart failure who was brought into the emergency department from nursing home with a complaint of shortness of breath cough and altered mental status.  As per records patient was diagnosed with a pneumonia few days ago but unfortunately could not get antibiotics, got worse became short of breath when EMS was called her O2 sat was 79% on room air, she was placed on nonrebreather mask with improvement O2 sat 100% and she was brought to the emergency department.  Evening of 1/10, she had desaturation to the point of near arrest.  Code blue called and pt intubated by anesthesia before being transferred to [facility] ICU.  Past Medical History:  COPD Diabetes type 2 Hyperlipidemia Hypertension Chronic systolic/diastolic congestive heart failure Hyperlipidemia  Significant Hospital Events:  1/8 > admit. 1/9 SLP eval, mod aspiration risk 1/9 GI consult for dysphagia > barium esophogram ordered before deciding on next steps (? EGD, dilation for stricture etc). 1/10 PT eval > SNF recommended  Consults:  PCCM  Procedures:  ETT 1/10 > 1/11  Significant Diagnostic Tests:  1/8: X-ray chest: Consistent with multifocal pneumonia right more than left 1/10 barrium swallow > smooth narrowing of distal esophagus just above GE junction. 1/11 LE Duplex > neg  Micro Data:  1/8  influenza/Covid PCR negative 1/8 blood cultures >neg 1/8 urine culture > VRE  Antimicrobials:  Ceftriaxone 1/8 >1/10 Azithromycin 1/8 > 1/10 Cefepime 1/10 >  Vanc 1/10 > 1/12 linezolid 1/12->  Interim History / Subjective:  Remains in mild resp distress, intermittently BIPAP dependent. Family meeting planned for today.  Objective   Blood pressure (!) 162/56, pulse 98, temperature 97.6 F (36.4 C), temperature source Axillary, resp. rate (!) 26, height 5\' 4"  (1.626 m), weight 64.8 kg, SpO2 94 %.    Vent Mode: BIPAP FiO2 (%):  [50 %] 50 % Set Rate:  [10 bmp] 10 bmp   Intake/Output Summary (Last 24 hours) at 07/12/2020 0930 Last data filed at 07/12/2020 0500 Gross per 24 hour  Intake 546.82 ml  Output 1200 ml  Net -653.18 ml   Filed Weights   07/17/2020 2318 07/08/20 1830 07/09/20 0600  Weight: 59 kg 59 kg 64.8 kg    Examination: Constitutional: cachetic woman lying in bed in mild resp distress  Eyes: EOMI, pupils reactive Ears, nose, mouth, and throat: poor dentition, MM dry Cardiovascular: Irregular, ext lukewarm, chronic vascular insufficiency changes of LE Respiratory: scattered rhonci, +accessory muscle use Gastrointestinal: soft, +BS Skin: No rashes, normal turgor Neurologic: moves all 4 ext briskly to command Psychiatric: too somnolent to answer  Renal function stable Stable H/H WBC mildly up VRE in urine  Assessment & Plan:   Acute on chronic hypoxemic respiratory failure- has what seems to be combination of chronic aspiration syndrome, advanced COPD and volume overloaded state of heart.  Metabolic encephalopathy and delirium Alzheimer's dementia AKI on CKD- improved Dysphagia, esophageal stricture, mild- not EGD candidate at present  Chronic muscular deconditioning Afib/RVR- on dilt and heparin gtt VRE UTI- complicating encephalopathy picture SNF resident DM2- A1c 5.3% GERD HTN HLD  - Lasix, monitor renal indices - Linezolid x 7 days - Family  meeting today regarding GOC with palliative, I think she would be a good hospice candidate - If full scope of care desired, patient needs cotrak - If she ends up reintubated, would ask GI to scope/dilate while intubated - Continue dilt/heparin gtt while no PO access   Patient critically ill due to acute on chronic hypoxemic respiratory failure Interventions to address this today family meeting, diuresis, potential reintubation Risk of deterioration without these interventions is high  I personally spent 37 minutes providing critical care not including any separately billable procedures  Erskine Emery MD [facility] 07/12/2020 9:54 AM Personal pager: [phone] If unanswered, please page CCM On-call: [phone]

## 2020-07-12 NOTE — Evaluation (Signed)
Occupational Therapy Evaluation Patient Details Name: Hannah [name] MRN: [id number] DOB: [birth year] Today's Date: 07/12/2020    History of Present Illness Hannah [name] is a 87 y.o. female with medical history significant of DM2, COPD on 3L at baseline, GERD, HLD, HTN, dementia presenting to ED with 5d hx of SOB, cought, and fever. Recent PNA dx, started on antiobiotics.Evening of 1/10, she had desaturation to the point of near arrest.  Code blue called and pt intubated by anesthesia before being transferred to [facility] ICU.   Clinical Impression   Patient admitted with the diagnosis above.  Currently she is in an ICU unit being closely monitored.  She did follow commands and answered appropriately to yes/no questions.  She was able to sit at the edge of the bed for nearly 10 minutes.  She was able to wipe her face and clean her hands while sitting at the edge.  Patient had no complaints of pain or dizziness.  This was quite encouraging, as family would like to take her home with needed DME.  OT will continue to work towards this goal, and attempt to advance her to a supported sitting position out of the bed, and work on toilet transfers.      Follow Up Recommendations  No OT follow up    Equipment Recommendations  3 in 1 drop arm bedside commode;Tub/shower seat;Wheelchair [phone]);Wheelchair cushion [phone] gel foam cushion);Hospital bed    Recommendations for Other Services       Precautions / Restrictions Precautions Precautions: Fall Restrictions Weight Bearing Restrictions: No      Mobility Bed Mobility Overal bed mobility: Needs Assistance Bed Mobility: Supine to Sit;Sit to Supine     Supine to sit: Min guard Sit to supine: Min assist        Transfers                      Balance Overall balance assessment: Needs assistance Sitting-balance support: Single extremity supported;Feet unsupported Sitting balance-Leahy Scale: Fair                                      ADL either performed or assessed with clinical judgement   ADL Overall ADL's : Needs assistance/impaired Eating/Feeding: NPO   Grooming: Wash/dry hands;Wash/dry face;Oral care;Minimal assistance   Upper Body Bathing: Moderate assistance;Sitting   Lower Body Bathing: Total assistance   Upper Body Dressing : Minimal assistance;Sitting   Lower Body Dressing: Total assistance                 General ADL Comments: able to sit patient at EOB.  Transfers not assessed this date.     Vision Patient Visual Report: No change from baseline       Perception     Praxis      Pertinent Vitals/Pain Pain Assessment: No/denies pain Faces Pain Scale: No hurt Pain Intervention(s): Monitored during session     Hand Dominance Right   Extremity/Trunk Assessment Upper Extremity Assessment Upper Extremity Assessment: RUE deficits/detail RUE Deficits / Details: patient fell on the R arm and limited FF to shoulder. RUE Sensation: WNL RUE Coordination: WNL   Lower Extremity Assessment Lower Extremity Assessment: Defer to PT evaluation   Cervical / Trunk Assessment Cervical / Trunk Assessment: Kyphotic   Communication Communication Communication: Expressive difficulties   Cognition Arousal/Alertness: Awake/alert Behavior During Therapy: WFL for tasks assessed/performed Overall Cognitive Status: History  of cognitive impairments - at baseline                                 General Comments: Per PT Note: per daughter pt with dementia. pt able to follow all simple commands, Pt resides in [place] unit at [facility]. Used wheelchair and was Modif I with transfers and assist with ADLs per daughter.   General Comments   VS on 5 L O2    Exercises     Shoulder Instructions      Home Living Family/patient expects to be discharged to:: Private residence Living Arrangements: Children Available Help at Discharge: Family;Available 24  hours/day Type of Home: House                           Additional Comments: per Palliative note:family is making plans for her to move in with her daughter rather than return to a facility.  They feel she will be happier and have a better quality of life at home.      Prior Functioning/Environment Level of Independence: Needs assistance  Gait / Transfers Assistance Needed: per chart pt is w/c bound ADL's / Homemaking Assistance Needed: suspect assist from facility staff Communication / Swallowing Assistance Needed: NG Tube in place and ST is following          OT Problem List: Decreased strength;Decreased activity tolerance;Impaired balance (sitting and/or standing)      OT Treatment/Interventions: Self-care/ADL training;Therapeutic exercise;Balance training;Patient/family education;Therapeutic activities    OT Goals(Current goals can be found in the care plan section) Acute Rehab OT Goals OT Goal Formulation: Patient unable to participate in goal setting Time For Goal Achievement: 07/26/20 Potential to Achieve Goals: Fair ADL Goals Pt Will Perform Grooming: with set-up;sitting Pt Will Perform Upper Body Bathing: with supervision;sitting Pt Will Perform Upper Body Dressing: with supervision;sitting Pt Will Transfer to Toilet: with min assist;stand pivot transfer;bedside commode  OT Frequency: Min 2X/week   Barriers to D/C:    none noted       Co-evaluation              AM-PAC OT "6 Clicks" Daily Activity     Outcome Measure Help from another person eating meals?: Total Help from another person taking care of personal grooming?: A Little Help from another person toileting, which includes using toliet, bedpan, or urinal?: A Lot Help from another person bathing (including washing, rinsing, drying)?: A Lot Help from another person to put on and taking off regular upper body clothing?: A Little Help from another person to put on and taking off regular lower  body clothing?: Total 6 Click Score: 12   End of Session Equipment Utilized During Treatment: Oxygen  Activity Tolerance: Patient tolerated treatment well Patient left: in bed;with call bell/phone within reach;with bed alarm set;with nursing/sitter in room  OT Visit Diagnosis: Unsteadiness on feet (R26.81);Muscle weakness (generalized) (M62.81)                Time: 2725-3664 OT Time Calculation (min): 25 min Charges:  OT General Charges $OT Visit: 1 Visit OT Evaluation $OT Eval Moderate Complexity: 1 Mod OT Treatments $Self Care/Home Management : 8-22 mins  07/12/2020  Rich, OTR/L  Acute Rehabilitation Services  Office:  [place] 07/12/2020, 3:39 PM

## 2020-07-12 NOTE — Progress Notes (Signed)
ANTICOAGULATION CONSULT NOTE - Follow Up  Pharmacy Consult for heparin Indication: atrial fibrillation  Allergies  Allergen Reactions  . Codeine Nausea And Vomiting  . Sulfa Antibiotics Nausea And Vomiting    Patient Measurements: Height: 5\' 4"  (162.6 cm) Weight: 64.8 kg (142 lb 13.7 oz) IBW/kg (Calculated) : 54.7 kg Heparin Dosing Weight: 59 kg  Vital Signs: Temp: 97.6 F (36.4 C) (01/14 0315) Temp Source: Axillary (01/14 0315) BP: 150/74 (01/14 0700) Pulse Rate: 104 (01/14 0700)  Labs: Recent Labs    07/10/20 0037 07/11/20 0556 07/12/20 0140  HGB 10.3* 11.0* 10.7*  HCT 35.8* 36.6 34.9*  PLT 256 314 256  HEPARINUNFRC 0.52 0.51 0.41  CREATININE 1.83* 1.61* 1.47*    Estimated Creatinine Clearance: 23.7 mL/min (A) (by C-G formula based on SCr of 1.47 mg/dL (H)).  Assessment: 87 yo female admitted with sepsis due to pneumonia and found to be in Afib w/ RVR. CHADSVASc = 6. PTA the patient was not on anticoagulation. Pharmacy is consulted to dose heparin.  Heparin level this morning is therapeutic at 0.41 while running at 900 units/hr. CBC is stable and WNL. Per the RN, there were no issues with the infusion and the patient is without signs or symptoms of bleeding.   Goal of Therapy:  Heparin level 0.3-0.7 units/ml Monitor platelets by anticoagulation protocol: Yes   Plan:  Continue heparin IV at 900 units/hr Monitor a daily heparin level and CBC Monitor for signs and symptoms of bleeding Follow up long term anticoagulation plans   Shauna Hugh, PharmD, [place]  PGY-1 Pharmacy Resident 07/12/2020 8:03 AM  Please check [email or web address] for unit-specific pharmacy phone numbers.

## 2020-07-12 NOTE — Progress Notes (Signed)
Initial Nutrition Assessment  DOCUMENTATION CODES:   Not applicable  INTERVENTION:   Tube Feeding via Cortrak:  Osmolite 1.2 at 55 ml/hr Begin TF at 20 ml/hr; titrate by 10 ml/hr q 8 hours until goal rate of 55 ml/hr Provides 73 g of protein, 1584 kcals and 1069 mL of free water  NUTRITION DIAGNOSIS:   Inadequate oral intake related to acute illness as evidenced by NPO status.  GOAL:   Patient will meet greater than or equal to 90% of their needs  MONITOR:   TF tolerance,Diet advancement,Labs,Weight trends  REASON FOR ASSESSMENT:   Rounds    ASSESSMENT:   87 yo female admitted with acute on chronic respiratory failure with encephalopathy (Alzheimer's dementia at baseline), AKI on CKD, dysphagia with esophageal stricture. PMH HTN, GERD, HLD, DM   1/08 Admitted 1/10 Intubated 1/11 Extubated 1/14 Failed MBS  Pt is confused, requiring 1:1 sitter.  MBS today with recommendations of NPO status.   Per Palliative Care note, family would not want a long term feeding tube but are agreeable to short term NG feedings. Plan for Cortrak tube today   No BM x 5 days, on bowel regimen  Labs: reviewed Meds: lasix, ss novolog, miralax  Diet Order:   Diet Order            Diet NPO time specified Except for: Sips with Meds  Diet effective midnight           DIET - DYS 1 Room service appropriate? Yes; Fluid consistency: Nectar Thick  Diet effective now                 EDUCATION NEEDS:   Not appropriate for education at this time  Skin:  Skin Assessment: Reviewed RN Assessment  Last BM:  PTA  Height:   Ht Readings from Last 1 Encounters:  07/08/20 5\' 4"  (1.626 m)    Weight:   Wt Readings from Last 1 Encounters:  07/09/20 64.8 kg    BMI:  Body mass index is 24.52 kg/m.  Estimated Nutritional Needs:   Kcal:  1500-1700 kcals  Protein:  75-85 g  Fluid:  >/= 1.5 L   Kerman Passey MS, RDN, LDN, CNSC Registered Dietitian III Clinical Nutrition RD Pager  and On-Call Pager Number Located in [place]

## 2020-07-13 DIAGNOSIS — J9621 Acute and chronic respiratory failure with hypoxia: Secondary | ICD-10-CM | POA: Diagnosis not present

## 2020-07-13 DIAGNOSIS — J9622 Acute and chronic respiratory failure with hypercapnia: Secondary | ICD-10-CM | POA: Diagnosis not present

## 2020-07-13 LAB — CBC
HCT: 36.5 % (ref 36.0–46.0)
Hemoglobin: 10.9 g/dL — ABNORMAL LOW (ref 12.0–15.0)
MCH: 26.3 pg (ref 26.0–34.0)
MCHC: 29.9 g/dL — ABNORMAL LOW (ref 30.0–36.0)
MCV: 88 fL (ref 80.0–100.0)
Platelets: 317 10*3/uL (ref 150–400)
RBC: 4.15 MIL/uL (ref 3.87–5.11)
RDW: 16.8 % — ABNORMAL HIGH (ref 11.5–15.5)
WBC: 11.3 10*3/uL — ABNORMAL HIGH (ref 4.0–10.5)
nRBC: 0.4 % — ABNORMAL HIGH (ref 0.0–0.2)

## 2020-07-13 LAB — GLUCOSE, CAPILLARY
Glucose-Capillary: 129 mg/dL — ABNORMAL HIGH (ref 70–99)
Glucose-Capillary: 140 mg/dL — ABNORMAL HIGH (ref 70–99)
Glucose-Capillary: 155 mg/dL — ABNORMAL HIGH (ref 70–99)
Glucose-Capillary: 168 mg/dL — ABNORMAL HIGH (ref 70–99)
Glucose-Capillary: 206 mg/dL — ABNORMAL HIGH (ref 70–99)
Glucose-Capillary: 229 mg/dL — ABNORMAL HIGH (ref 70–99)

## 2020-07-13 LAB — MAGNESIUM
Magnesium: 1.7 mg/dL (ref 1.7–2.4)
Magnesium: 2.2 mg/dL (ref 1.7–2.4)

## 2020-07-13 LAB — BASIC METABOLIC PANEL
Anion gap: 13 (ref 5–15)
BUN: 42 mg/dL — ABNORMAL HIGH (ref 8–23)
CO2: 26 mmol/L (ref 22–32)
Calcium: 9 mg/dL (ref 8.9–10.3)
Chloride: 98 mmol/L (ref 98–111)
Creatinine, Ser: 1.41 mg/dL — ABNORMAL HIGH (ref 0.44–1.00)
GFR, Estimated: 36 mL/min — ABNORMAL LOW (ref >60)
Glucose, Bld: 254 mg/dL — ABNORMAL HIGH (ref 70–99)
Potassium: 3.9 mmol/L (ref 3.5–5.1)
Sodium: 137 mmol/L (ref 135–145)

## 2020-07-13 LAB — HEPARIN LEVEL (UNFRACTIONATED): Heparin Unfractionated: 0.43 IU/mL (ref 0.30–0.70)

## 2020-07-13 LAB — PHOSPHORUS
Phosphorus: 1.7 mg/dL — ABNORMAL LOW (ref 2.5–4.6)
Phosphorus: 1.7 mg/dL — ABNORMAL LOW (ref 2.5–4.6)

## 2020-07-13 MED ORDER — APIXABAN 2.5 MG PO TABS
2.5000 mg | ORAL_TABLET | Freq: Two times a day (BID) | ORAL | Status: DC
  Administered 2020-07-13 – 2020-07-18 (×11): 2.5 mg
  Filled 2020-07-13 (×11): qty 1

## 2020-07-13 MED ORDER — DOCUSATE SODIUM 50 MG/5ML PO LIQD
100.0000 mg | Freq: Two times a day (BID) | ORAL | Status: DC
  Administered 2020-07-13 – 2020-07-17 (×9): 100 mg
  Filled 2020-07-13 (×11): qty 10

## 2020-07-13 MED ORDER — LABETALOL HCL 5 MG/ML IV SOLN: 10.0000 mg | INTRAVENOUS | Status: DC | PRN

## 2020-07-13 MED ORDER — DILTIAZEM HCL 30 MG PO TABS
30.0000 mg | ORAL_TABLET | Freq: Four times a day (QID) | ORAL | Status: DC
  Administered 2020-07-13 – 2020-07-15 (×8): 30 mg
  Filled 2020-07-13 (×8): qty 1

## 2020-07-13 MED ORDER — AMLODIPINE BESYLATE 5 MG PO TABS: 5.0000 mg | ORAL_TABLET | Freq: Every day | ORAL | Status: DC

## 2020-07-13 MED ORDER — MAGNESIUM SULFATE 2 GM/50ML IV SOLN
2.0000 g | Freq: Once | INTRAVENOUS | Status: AC
  Administered 2020-07-13: 2 g via INTRAVENOUS
  Filled 2020-07-13: qty 50

## 2020-07-13 MED ORDER — FUROSEMIDE 40 MG PO TABS
40.0000 mg | ORAL_TABLET | Freq: Every day | ORAL | Status: DC
  Administered 2020-07-14 – 2020-07-17 (×4): 40 mg
  Filled 2020-07-13 (×4): qty 1

## 2020-07-13 MED ORDER — POLYETHYLENE GLYCOL 3350 17 G PO PACK
17.0000 g | PACK | Freq: Every day | ORAL | Status: DC
  Administered 2020-07-13 – 2020-07-16 (×3): 17 g
  Filled 2020-07-13 (×3): qty 1

## 2020-07-13 MED ORDER — LEVOTHYROXINE SODIUM 25 MCG PO TABS
25.0000 ug | ORAL_TABLET | Freq: Every day | ORAL | Status: DC
  Administered 2020-07-14 – 2020-07-18 (×5): 25 ug
  Filled 2020-07-13 (×5): qty 1

## 2020-07-13 NOTE — Progress Notes (Signed)
NAME:  Hannah [name], MRN:  [id number], DOB:  [birth year], LOS: 7 ADMISSION DATE:  24-Jul-2020, CONSULTATION DATE: July 24, 2020 REFERRING MD: Dr. Dina Rich from ED, CHIEF COMPLAINT: Shortness of breath cough and altered mental status  Brief History:  87 year old female from nursing home coming in with sepsis due to multifocal pneumonia and acute hypoxic respiratory failure.\ Had near arrest evening 1/10 and required intubation and transfer to ICU.  History of Present Illness:  Patient is encephalopathic so most of the history is taken from medical records 87 year old female with COPD, diabetes and chronic systolic and diastolic congestive heart failure who was brought into the emergency department from nursing home with a complaint of shortness of breath cough and altered mental status.  As per records patient was diagnosed with a pneumonia few days ago but unfortunately could not get antibiotics, got worse became short of breath when EMS was called her O2 sat was 79% on room air, she was placed on nonrebreather mask with improvement O2 sat 100% and she was brought to the emergency department.  Evening of 1/10, she had desaturation to the point of near arrest.  Code blue called and pt intubated by anesthesia before being transferred to [facility] ICU.  Past Medical History:  COPD Diabetes type 2 Hyperlipidemia Hypertension Chronic systolic/diastolic congestive heart failure Hyperlipidemia  Significant Hospital Events:  1/8 > admit. 1/9 SLP eval, mod aspiration risk 1/9 GI consult for dysphagia > barium esophogram ordered before deciding on next steps (? EGD, dilation for stricture etc). 1/10 PT eval > SNF recommended  Consults:  PCCM  Procedures:  ETT 1/10 > 1/11  Significant Diagnostic Tests:  1/8: X-ray chest: Consistent with multifocal pneumonia right more than left 1/10 barrium swallow > smooth narrowing of distal esophagus just above GE junction. 1/11 LE Duplex > neg  Micro Data:  1/8  influenza/Covid PCR negative 1/8 blood cultures >neg 1/8 urine culture > VRE  Antimicrobials:  Ceftriaxone 1/8 >1/10 Azithromycin 1/8 > 1/10 Cefepime 1/10 >  Vanc 1/10 > 1/12 linezolid 1/12->  Interim History / Subjective:  Looks and feels better this AM. Mouth feels dry. Wore BIPAP for a bit last night.  Objective   Blood pressure (!) 146/96, pulse 76, temperature (!) 97.1 F (36.2 C), temperature source Axillary, resp. rate (!) 24, height 5\' 4"  (1.626 m), weight 59.9 kg, SpO2 100 %.        Intake/Output Summary (Last 24 hours) at 07/13/2020 5366 Last data filed at 07/13/2020 0800 Gross per 24 hour  Intake 1560.79 ml  Output 4750 ml  Net -3189.21 ml   Filed Weights   07/08/20 1830 07/09/20 0600 07/13/20 0500  Weight: 59 kg 64.8 kg 59.9 kg    Examination: Constitutional: cachetic woman in NAD  Eyes: EOMI Ears, nose, mouth, and throat: MM dry, temporal wasting Cardiovascular: RRR, ext warm Respiratory: Diminished but better air movement than yesterday, less tachypneic Gastrointestinal: Soft, +BS Skin: No rashes, normal turgor Neurologic: Moves all 4 ext to command Psychiatric: RASS 0   Renal function stable, net -3.2L yesterday with lasix Stable H/H WBC mildly up VRE in urine  Assessment & Plan:   Acute on chronic hypoxemic respiratory failure- has what seems to be combination of chronic aspiration syndrome, advanced COPD and volume overloaded state of heart. Improved. Metabolic encephalopathy and delirium Alzheimer's dementia AKI on CKD- improved Dysphagia, esophageal stricture, mild- not EGD candidate at present Chronic muscular deconditioning Afib/RVR- on dilt and heparin gtt VRE UTI- complicating encephalopathy picture SNF resident DM2-  A1c 5.3% GERD HTN HLD  - Lasix PO to start tomorrow - Replete Mg - Linezolid x 7 days - Appreciate continued palliative input, would be good candidate for hospice - Continue TF by cortrak - Switch heparin to  eliquis, switch dilt to PO - BIPAP at night if she is willing - Seems to have turned corner with diuresis, PCCM available PRN  Erskine Emery MD [facility] 07/13/2020 9:37 AM Personal pager: [phone] If unanswered, please page CCM On-call: [phone]

## 2020-07-13 NOTE — Progress Notes (Signed)
PROGRESS NOTE    Hannah [name]  WCH:[id number] DOB: September 21, 1931 DOA: 07/15/2020 PCP: Street, [name], MD   Chief Complaint  Patient presents with  . Shortness of Breath   Brief Narrative: 87 y.o. female with medical history significant of DM2, COPD on 3L at baseline, GERD, HLD, HTN, dementia, wheelchair-bound who was enrolled in hospice of [name] prior to admission who was recently diagnosed with pneumonia and was started on antibiotics on day of admission brought to the ED  07/04/2020 with hypoxia 79% RA, and was admitted for septic shock due to CAP, new onset of A.fib, and had desaturation event to the point of near arrest on 1/30 CODE BLUE called intubated and transferred to ICU. Significant events: 1/8 admitted 1/9 SLP eval, mod aspiration risk 1/9 GI consult for dysphagia > barium esophogram ordered before deciding on next steps (? EGD, dilation for stricture etc). 1/10 PT eval > SNF recommended 1/10-intubated 1/11-extubated to [facility] 1/13- tx to [facility], in resp distress needing bipap, persistent A. fib placed on Cardizem drip, seen by cardiology 1/14- started diuresis, cortark tube  Subjective:  Overnight events noted.   Patient afebrile WBC downtrending.   She is much more alert awake oriented, able to whisper her name, follows commands.  She is cleaning her teeth with swab, cortrak tube in place. Oxygen down to 4 L nasal cannula. Good urine output  Assessment & Plan:  Acute on chronic hypoxic respiratory failure/COPD with chronic hypoxia at home on 3 to nasal cannula: Multifactorial with advanced COPD, chronic aspiration syndrome and volume overload: Had significant diuresis w/ iv lasix 07/12/20 and respiratory status appears to be improving, on 4l [place]. was extubated on 1/11 needed BiPAP 1/13. CXR-bilateral pleural effusions rt>lt left with passive atelectasis, started diureses w/ good urine output-changed to Lasix via NG tube, continue bronchodilators continue antibiotics.   Appreciate pulmonary input who are following closely and okay to transfer to progressive floor today.  Monitor respiratory status, patient is DNR but okay for intubation Perative care following  Intake/Output Summary (Last 24 hours) at 07/13/2020 1022 Last data filed at 07/13/2020 0800 Gross per 24 hour  Intake 1560.79 ml  Output 4750 ml  Net -3189.21 ml   Acute metabolic encephalopathy likely multifactorial with failure to thrive, delirium hospital-acquired in the setting of dementia: She is much more alert and awake, able to whisper her name. Core track placed for feeding 1/14.   New onset A. fib with RVR/history of systolic CHF with EF 50 to 55%/hyperlipidemia: Rate controlled on Cardizem drip,on heparin GTT-changed to oral Eliquis by PCCM-but poor candidate for long-term anticoagulation and will likely not continue on discharge unless cardiology recommends due to fall risk/dementia.  Appreciate cardiology input on board.   Sepsis due to VRE UTI on Zyvox x7 days starting 1/12.  Labs improving as below. Recent Labs  Lab 07/20/2020 1555 07/07/20 0004 07/07/20 0155 07/07/20 0510 07/08/20 0137 07/08/20 1828 07/09/20 0048 07/10/20 0037 07/11/20 0556 07/12/20 0140 07/13/20 0111  WBC  --   --   --  13.4* 16.1*   < > 11.3* 9.5 14.1* 13.1* 11.3*  LATICACIDVEN 1.2  --  1.2  --   --   --   --   --   --   --   --   PROCALCITON  --  0.43  --  0.37 0.32  --   --   --   --   --   --    < > = values in this  interval not displayed.   Dysphagia  with narrowing of distal esophagus just above GE junction in barium swallow, history of esophageal strictures needing dilation in the past.  Seen by GI and signed off.  Speech following on core track tube feeding.  May need esophageal dilation if she is intubated   AKI on CKD??  Baseline/with metabolic acidosis: Creatinine peaked at 2.2, BUN/creatinine is improving. Hopefully tube feeding will help.  Dietitian following.   Good output with Lasix. Recent Labs   Lab 07/09/20 0920 07/10/20 0037 07/11/20 0556 07/12/20 0140 07/13/20 0111  BUN 68* 68* 63* 57* 42*  CREATININE 1.89* 1.83* 1.61* 1.47* 1.41*   Hyperkalemia potassium improved, status post Kayexalate and Lokelma. Recent Labs  Lab 07/09/20 0920 07/10/20 0037 07/11/20 0556 07/12/20 0140 07/13/20 0111  K 5.0 5.3* 4.5 4.6 3.9   History of diabetes mellitus hold home metformin,  A1c stable 5.3.  Blood sugar acceptable on sliding scale insulin.  Monitor  Recent Labs  Lab 07/12/20 1203 07/12/20 1532 07/12/20 2032 07/13/20 0001 07/13/20 0439  GLUCAP 174* 168* 199* 229* 206*   Hypothyroidism-continue Synthroid  Lower extremity swelling 2+ negative for DVT  GOC: Patient is a hospice candidate but family wants to proceed with full scope of treatment including intubation but DNR, short-term to feed.Palliative care including intubation but DNR.  Pulmonary following closely.   Nutrition: Diet Order            Diet NPO time specified Except for: Sips with Meds  Diet effective midnight                 Body mass index is 22.67 kg/m.  DVT prophylaxis: heparin Code Status:   Code Status: Partial Code  Family Communication: plan of care discussed with patient/S DAUGTER 1/13. called son today- no answer.  Status is: Inpatient  Remains inpatient appropriate because:IV treatments appropriate due to intensity of illness or inability to take PO and Inpatient level of care appropriate due to severity of illness, on NGT feeding  Dispo: The patient is from: hospice              Anticipated d/c is to: TBD              Anticipated d/c date is: 3 days              Patient currently is not medically stable to d/c.  Consultants:see note  Procedures:see note  Culture/Microbiology    Component Value Date/Time   SDES IN/OUT CATH URINE 06/30/2020 1623   SPECREQUEST NONE 06/30/2020 1623   CULT (A) 07/12/2020 1623    >=100,000 COLONIES/mL VANCOMYCIN RESISTANT ENTEROCOCCUS >=100,000  COLONIES/mL GRANULICATELLA ADIACENS Standardized susceptibility testing for this organism is not available. Performed at [facility], [address], [place], [place] [address]    REPTSTATUS 07/10/2020 FINAL 07/14/2020 1623    Other culture-see note  Medications: Scheduled Meds: . chlorhexidine gluconate (MEDLINE KIT)  15 mL Mouth Rinse BID  . Chlorhexidine Gluconate Cloth  6 each Topical Daily  . docusate  100 mg Oral BID  . insulin aspart  0-9 Units Subcutaneous Q4H  . levothyroxine  25 mcg Oral QAC breakfast  . mupirocin ointment  1 application Nasal BID  . pantoprazole (PROTONIX) IV  40 mg Intravenous Q12H  . polyethylene glycol  17 g Oral Daily  . Resource ThickenUp Clear   Oral Once   Continuous Infusions: . diltiazem (CARDIZEM) infusion 5 mg/hr (07/13/20 0600)  . feeding supplement (OSMOLITE 1.2  CAL) 1,000 mL (07/12/20 1540)  . heparin 900 Units/hr (07/13/20 0600)  . linezolid (ZYVOX) IV Stopped (07/12/20 2216)    Antimicrobials: Anti-infectives (From admission, onward)   Start     Dose/Rate Route Frequency Ordered Stop   07/10/20 1015  linezolid (ZYVOX) IVPB 600 mg        600 mg 300 mL/hr over 60 Minutes Intravenous Every 12 hours 07/10/20 0923 07/17/20 0959   07/09/20 0345  vancomycin (VANCOREADY) IVPB 750 mg/150 mL        750 mg 150 mL/hr over 60 Minutes Intravenous  Once 07/09/20 0250 07/09/20 0506   07/07/20 2300  ceFEPIme (MAXIPIME) 2 g in sodium chloride 0.9 % 100 mL IVPB        2 g 200 mL/hr over 30 Minutes Intravenous Every 24 hours 07/14/2020 2331 07/12/20 0633   07/04/2020 2331  vancomycin variable dose per unstable renal function (pharmacist dosing)  Status:  Discontinued         Does not apply See admin instructions 07/25/2020 2331 07/10/20 0923   07/14/2020 2330  vancomycin (VANCOREADY) IVPB 1250 mg/250 mL        1,250 mg 166.7 mL/hr over 90 Minutes Intravenous  Once 07/05/2020 2325 07/07/20 0317   07/29/2020 2330  ceFEPIme (MAXIPIME) 2 g in sodium chloride  0.9 % 100 mL IVPB        2 g 200 mL/hr over 30 Minutes Intravenous  Once 06/29/2020 2325 07/07/20 0024   07/03/2020 1600  cefTRIAXone (ROCEPHIN) 2 g in sodium chloride 0.9 % 100 mL IVPB  Status:  Discontinued        2 g 200 mL/hr over 30 Minutes Intravenous Every 24 hours 07/24/2020 1557 06/29/2020 2325   07/09/2020 1600  azithromycin (ZITHROMAX) 500 mg in sodium chloride 0.9 % 250 mL IVPB  Status:  Discontinued        500 mg 250 mL/hr over 60 Minutes Intravenous Every 24 hours 07/22/2020 1557 07/09/20 0807     Objective: Vitals: Today's Vitals   07/13/20 0300 07/13/20 0400 07/13/20 0500 07/13/20 0600  BP: (!) 164/82 (!) 172/73 (!) 121/52 140/80  Pulse: 97 90 76 93  Resp: (!) 21 (!) 21 (!) 24 (!) 26  Temp:  97.6 F (36.4 C)    TempSrc:  Oral    SpO2: 99% 94% (!) 87% 96%  Weight:   59.9 kg   Height:      PainSc:  0-No pain      Intake/Output Summary (Last 24 hours) at 07/13/2020 0720 Last data filed at 07/13/2020 9381 Gross per 24 hour  Intake 1532.79 ml  Output 4750 ml  Net -3217.21 ml   Filed Weights   07/08/20 1830 07/09/20 0600 07/13/20 0500  Weight: 59 kg 64.8 kg 59.9 kg   Weight change:   Intake/Output from previous day: 01/14 0701 - 01/15 0700 In: 1532.8 [I.V.:322.6; NG/GT:310; IV Piggyback:900.2] Out: 4750 [Urine:4750] Intake/Output this shift: No intake/output data recorded.  Examination: General exam: Alert awake, whispering, follows some commands, cachectic, thin and frail.  On 4 L oxygen.   HEENT:Oral mucosa moist, Ear/Nose WNL grossly, dentition normal. Respiratory system: bilaterally basal crackles,no use of accessory muscle Cardiovascular system: S1 & S2 +, No JVD. Gastrointestinal system: Abdomen soft, NT,ND, BS+,NGT+ Nervous System:Alert, awake, moving extremities and grossly nonfocal Extremities: No edema, distal peripheral pulses palpable.  Skin: No rashes,no icterus. MSK: Normal muscle bulk,tone, power  Data Reviewed: I have personally reviewed following  labs and imaging studies CBC: Recent Labs  Lab  07/07/20 0510 07/08/20 0137 07/09/20 0048 07/10/20 0037 07/11/20 0556 07/12/20 0140 07/13/20 0111  WBC 13.4*   < > 11.3* 9.5 14.1* 13.1* 11.3*  NEUTROABS 11.4*  --   --   --   --   --   --   HGB 10.0*   < > 10.0* 10.3* 11.0* 10.7* 10.9*  HCT 34.3*   < > 33.4* 35.8* 36.6 34.9* 36.5  MCV 94.5   < > 89.5 92.5 88.2 89.7 88.0  PLT 221   < > 252 256 314 256 317   < > = values in this interval not displayed.   Basic Metabolic Panel: Recent Labs  Lab 07/07/20 0510 07/08/20 0137 07/09/20 0048 07/09/20 0920 07/10/20 0037 07/11/20 0556 07/12/20 0140 07/12/20 1431 07/13/20 0111  NA 132*   < >  --  130* 133* 134* 135  --  137  K 6.3*   < >  --  5.0 5.3* 4.5 4.6  --  3.9  CL 97*   < >  --  99 101 102 103  --  98  CO2 23   < >  --  20* 20* 19* 18*  --  26  GLUCOSE 127*   < >  --  121* 149* 84 160*  --  254*  BUN 49*   < >  --  68* 68* 63* 57*  --  42*  CREATININE 2.18*   < >  --  1.89* 1.83* 1.61* 1.47*  --  1.41*  CALCIUM 8.7*   < >  --  8.2* 8.4* 8.7* 8.7*  --  9.0  MG 2.1  --  2.1 2.1 2.2  --   --  2.0 1.7  PHOS 5.9*  --  4.0  --  4.2  --   --  2.4* 1.7*   < > = values in this interval not displayed.   GFR: Estimated Creatinine Clearance: 24.7 mL/min (A) (by C-G formula based on SCr of 1.41 mg/dL (H)). Liver Function Tests: Recent Labs  Lab 07/21/2020 1555 07/07/20 0510 07/08/20 1828  AST 85* 86* 122*  ALT 50* 61* 100*  ALKPHOS 131* 150* 168*  BILITOT 0.6 0.8 0.5  PROT 6.6 6.2* 5.8*  ALBUMIN 3.2* 2.8* 2.7*   No results for input(s): LIPASE, AMYLASE in the last 168 hours. No results for input(s): AMMONIA in the last 168 hours. Coagulation Profile: Recent Labs  Lab 07/04/2020 1555  INR 1.1   Cardiac Enzymes: Recent Labs  Lab 07/07/20 0004  CKTOTAL 17*   BNP (last 3 results) No results for input(s): PROBNP in the last 8760 hours. HbA1C: No results for input(s): HGBA1C in the last 72 hours. CBG: Recent Labs  Lab  07/12/20 1203 07/12/20 1532 07/12/20 2032 07/13/20 0001 07/13/20 0439  GLUCAP 174* 168* 199* 229* 206*   Lipid Profile: No results for input(s): CHOL, HDL, LDLCALC, TRIG, CHOLHDL, LDLDIRECT in the last 72 hours. Thyroid Function Tests: No results for input(s): TSH, T4TOTAL, FREET4, T3FREE, THYROIDAB in the last 72 hours. Anemia Panel: No results for input(s): VITAMINB12, FOLATE, FERRITIN, TIBC, IRON, RETICCTPCT in the last 72 hours. Sepsis Labs: Recent Labs  Lab 07/26/2020 1555 07/07/20 0004 07/07/20 0155 07/07/20 0510 07/08/20 0137  PROCALCITON  --  0.43  --  0.37 0.32  LATICACIDVEN 1.2  --  1.2  --   --     Recent Results (from the past 240 hour(s))  Resp Panel by RT-PCR (Flu A&B, Covid) Nasopharyngeal Swab  Status: None   Collection Time: 07/26/2020  3:07 PM   Specimen: Nasopharyngeal Swab; Nasopharyngeal(NP) swabs in vial transport medium  Result Value Ref Range Status   SARS Coronavirus 2 by RT PCR NEGATIVE NEGATIVE Final    Comment: (NOTE) SARS-CoV-2 target nucleic acids are NOT DETECTED.  The SARS-CoV-2 RNA is generally detectable in upper respiratory specimens during the acute phase of infection. The lowest concentration of SARS-CoV-2 viral copies this assay can detect is 138 copies/mL. A negative result does not preclude SARS-Cov-2 infection and should not be used as the sole basis for treatment or other patient management decisions. A negative result may occur with  improper specimen collection/handling, submission of specimen other than nasopharyngeal swab, presence of viral mutation(s) within the areas targeted by this assay, and inadequate number of viral copies(<138 copies/mL). A negative result must be combined with clinical observations, patient history, and epidemiological information. The expected result is Negative.  Fact Sheet for Patients:  BloggerCourse.com  Fact Sheet for Healthcare Providers:   SeriousBroker.it  This test is no t yet approved or cleared by the [place] FDA and  has been authorized for detection and/or diagnosis of SARS-CoV-2 by FDA under an Emergency Use Authorization (EUA). This EUA will remain  in effect (meaning this test can be used) for the duration of the COVID-19 declaration under Section 564(b)(1) of the Act, 21 U.S.C.section 360bbb-3(b)(1), unless the authorization is terminated  or revoked sooner.       Influenza A by PCR NEGATIVE NEGATIVE Final   Influenza B by PCR NEGATIVE NEGATIVE Final    Comment: (NOTE) The Xpert Xpress SARS-CoV-2/FLU/RSV plus assay is intended as an aid in the diagnosis of influenza from Nasopharyngeal swab specimens and should not be used as a sole basis for treatment. Nasal washings and aspirates are unacceptable for Xpert Xpress SARS-CoV-2/FLU/RSV testing.  Fact Sheet for Patients: BloggerCourse.com  Fact Sheet for Healthcare Providers: SeriousBroker.it  This test is not yet approved or cleared by the [place] FDA and has been authorized for detection and/or diagnosis of SARS-CoV-2 by FDA under an Emergency Use Authorization (EUA). This EUA will remain in effect (meaning this test can be used) for the duration of the COVID-19 declaration under Section 564(b)(1) of the Act, 21 U.S.C. section 360bbb-3(b)(1), unless the authorization is terminated or revoked.  Performed at [facility], [address] [place], [place] [address]   Blood culture (routine single)     Status: None   Collection Time: 07/05/2020  3:55 PM   Specimen: BLOOD RIGHT FOREARM  Result Value Ref Range Status   Specimen Description BLOOD RIGHT FOREARM  Final   Special Requests   Final    BOTTLES DRAWN AEROBIC AND ANAEROBIC Blood Culture results may not be optimal due to an excessive volume of blood received in culture bottles   Culture   Final    NO  GROWTH 5 DAYS Performed at [facility], [address], [place], [place] [address]    Report Status 07/11/2020 FINAL  Final  Culture, blood (single)     Status: None   Collection Time: 07/17/2020  3:59 PM   Specimen: BLOOD  Result Value Ref Range Status   Specimen Description BLOOD LEFT ANTECUBITAL  Final   Special Requests   Final    BOTTLES DRAWN AEROBIC AND ANAEROBIC Blood Culture adequate volume   Culture   Final    NO GROWTH 5 DAYS Performed at [facility], [address]  [place], [place] [address]    Report Status 07/11/2020 FINAL  Final  Urine culture     Status: Abnormal   Collection Time: 07/10/2020  4:23 PM   Specimen: In/Out Cath Urine  Result Value Ref Range Status   Specimen Description IN/OUT CATH URINE  Final   Special Requests NONE  Final   Culture (A)  Final    >=100,000 COLONIES/mL VANCOMYCIN RESISTANT ENTEROCOCCUS >=100,000 COLONIES/mL GRANULICATELLA ADIACENS Standardized susceptibility testing for this organism is not available. Performed at [facility], [place] [address] [place], [place] [address]    Report Status 07/10/2020 FINAL  Final   Organism ID, Bacteria VANCOMYCIN RESISTANT ENTEROCOCCUS (A)  Final      Susceptibility   Vancomycin resistant enterococcus - MIC*    AMPICILLIN >=32 RESISTANT Resistant     NITROFURANTOIN 32 SENSITIVE Sensitive     VANCOMYCIN >=32 RESISTANT Resistant     LINEZOLID 2 SENSITIVE Sensitive     * >=100,000 COLONIES/mL VANCOMYCIN RESISTANT ENTEROCOCCUS  MRSA PCR Screening     Status: Abnormal   Collection Time: 07/09/20  4:16 AM   Specimen: Nasal Mucosa; Nasopharyngeal  Result Value Ref Range Status   MRSA by PCR POSITIVE (A) NEGATIVE Final    Comment:        The GeneXpert MRSA Assay (FDA approved for NASAL specimens only), is one component of a comprehensive MRSA colonization surveillance program. It is not intended to diagnose MRSA infection nor to guide or monitor treatment for MRSA  infections. RESULT CALLED TO, READ BACK BY AND VERIFIED WITH: A CAIN RN 07/09/20 0354 JDW Performed at [facility], [place] [address], [place],  [address]      Radiology Studies: DG CHEST PORT 1 VIEW  Result Date: 07/11/2020 CLINICAL DATA:  Acute hypoxic respiratory failure.  Extubation. EXAM: PORTABLE CHEST 1 VIEW COMPARISON:  07/09/2020 and earlier. FINDINGS: Interval extubation. Cardiac silhouette upper normal in size, unchanged. Pulmonary venous hypertension without overt edema. BILATERAL pleural effusions, RIGHT greater than LEFT, and associated passive atelectasis in the lower lobes, slightly increased since the examination 2 days ago. Stable linear atelectasis in the LEFT mid lung. IMPRESSION: 1. BILATERAL pleural effusions, RIGHT greater than LEFT, and associated passive atelectasis involving the lower lobes, slightly increased since the examination 2 days ago. 2. Developing pulmonary venous hypertension without overt edema. Electronically Signed   By: Evangeline Dakin M.D.   On: 07/11/2020 15:56     LOS: 7 days   Antonieta Pert, MD [facility]  07/13/2020, 7:20 AM

## 2020-07-13 NOTE — Progress Notes (Signed)
eLink Physician-Brief Progress Note Patient Name: Hannah [name] DOB: 1931/07/27 MRN: [id number]   Date of Service  07/13/2020  HPI/Events of Note  SBP 160-170's.  eICU Interventions  PRN Labetalol ordered for SBP > 160 mmHg.        Kerry Kass Ronson Hagins 07/13/2020, 5:18 AM

## 2020-07-14 ENCOUNTER — Inpatient Hospital Stay (HOSPITAL_COMMUNITY)

## 2020-07-14 DIAGNOSIS — J9621 Acute and chronic respiratory failure with hypoxia: Secondary | ICD-10-CM | POA: Diagnosis not present

## 2020-07-14 DIAGNOSIS — J9622 Acute and chronic respiratory failure with hypercapnia: Secondary | ICD-10-CM | POA: Diagnosis not present

## 2020-07-14 LAB — BASIC METABOLIC PANEL
Anion gap: 12 (ref 5–15)
BUN: 32 mg/dL — ABNORMAL HIGH (ref 8–23)
CO2: 27 mmol/L (ref 22–32)
Calcium: 8.6 mg/dL — ABNORMAL LOW (ref 8.9–10.3)
Chloride: 98 mmol/L (ref 98–111)
Creatinine, Ser: 1.09 mg/dL — ABNORMAL HIGH (ref 0.44–1.00)
GFR, Estimated: 49 mL/min — ABNORMAL LOW (ref >60)
Glucose, Bld: 176 mg/dL — ABNORMAL HIGH (ref 70–99)
Potassium: 4 mmol/L (ref 3.5–5.1)
Sodium: 137 mmol/L (ref 135–145)

## 2020-07-14 LAB — GLUCOSE, CAPILLARY
Glucose-Capillary: 147 mg/dL — ABNORMAL HIGH (ref 70–99)
Glucose-Capillary: 158 mg/dL — ABNORMAL HIGH (ref 70–99)
Glucose-Capillary: 174 mg/dL — ABNORMAL HIGH (ref 70–99)
Glucose-Capillary: 185 mg/dL — ABNORMAL HIGH (ref 70–99)
Glucose-Capillary: 187 mg/dL — ABNORMAL HIGH (ref 70–99)
Glucose-Capillary: 200 mg/dL — ABNORMAL HIGH (ref 70–99)
Glucose-Capillary: 208 mg/dL — ABNORMAL HIGH (ref 70–99)

## 2020-07-14 LAB — CBC
HCT: 40.3 % (ref 36.0–46.0)
Hemoglobin: 11.9 g/dL — ABNORMAL LOW (ref 12.0–15.0)
MCH: 26.4 pg (ref 26.0–34.0)
MCHC: 29.5 g/dL — ABNORMAL LOW (ref 30.0–36.0)
MCV: 89.6 fL (ref 80.0–100.0)
Platelets: 348 10*3/uL (ref 150–400)
RBC: 4.5 MIL/uL (ref 3.87–5.11)
RDW: 17.2 % — ABNORMAL HIGH (ref 11.5–15.5)
WBC: 24 10*3/uL — ABNORMAL HIGH (ref 4.0–10.5)
nRBC: 0.3 % — ABNORMAL HIGH (ref 0.0–0.2)

## 2020-07-14 LAB — PHOSPHORUS: Phosphorus: 2 mg/dL — ABNORMAL LOW (ref 2.5–4.6)

## 2020-07-14 LAB — PROCALCITONIN: Procalcitonin: <0.1 ng/mL

## 2020-07-14 NOTE — Progress Notes (Signed)
PROGRESS NOTE    Hannah [name]  DEY:[id number] DOB: 1931/10/21 DOA: 07/15/2020 PCP: Street, [name], MD   Chief Complaint  Patient presents with  . Shortness of Breath   Brief Narrative: 87 y.o. female with medical history significant of DM2, COPD on 3L at baseline, GERD, HLD, HTN, dementia, wheelchair-bound who was enrolled in hospice of [name] prior to admission who was recently diagnosed with pneumonia and was started on antibiotics on day of admission brought to the ED  07/20/2020 with hypoxia 79% RA, and was admitted for septic shock due to CAP, new onset of A.fib, and had desaturation event to the point of near arrest on 1/30 CODE BLUE called intubated and transferred to ICU. 1/9 GI consult for dysphagia > barium esophogram ordered before deciding on next steps (? EGD, dilation for stricture etc) GI has signed off since then due to poor respiratory status and other complex comorbidities, PT OT speech has been working. 1/1o- patient was intubated and extubated on 1/11. 1/13- tx to [facility], but was in resp distress needing bipap, persistent A. fib placed on Cardizem drip, seen by cardiology, 1/14- started diuresis, cortark tube-with significant diuresis and patient overall clinically improved.  Transferred to progressive bed 1/15  Subjective: Nursing report oxygen bumped from 4 to 6 L this morning.  Patient is breathing through the mouth.  Appears to be breathing somewhat faster but alert awake comfortable whispering to answer Afebrile overnight. WBC bumped up but creatinine improved  Assessment & Plan:  Acute on chronic hypoxic respiratory failure/COPD with chronic hypoxia at home on 3l [place]: Multifactorial with advanced COPD, chronic aspiration syndrome and volume overload:  extubated on 1/11 needed BiPAP 1/13. CXR-bilateral pleural effusions rt>lt left with passive atelectasis,Had significant diuresis w/ iv lasix 07/12/20 and respiratory status and mental status much better and transferred to  progressive unit.  This morning needing more oxygen -checked chest x-ray-with mild bibasilar subsegmental atelectasis with probable small pleural effusions procal is negative.Continue Lasix-May need to give IV dose if any resp issues  cont bronchodilators, antibiotics. Appreciate pulmonary input.   Net IO Since Admission: 924.1 mL [07/14/20 1007]  Intake/Output Summary (Last 24 hours) at 07/14/2020 1007 Last data filed at 07/14/2020 0850 Gross per 24 hour  Intake 853.21 ml  Output 1450 ml  Net -596.79 ml   Acute metabolic encephalopathy likely multifactorial with failure to thrive, delirium hospital-acquired in the setting of dementia: NG feedin w. Janyce Llanos 1/14  New onset A. fib with RVR/history of systolic CHF with EF 50 to 55%/hyperlipidemia: Rate controlled now NGT  Cardizem, Eliquis  Per PCCM-but poor candidate for long-term anticoagulation and will likely not continue on discharge unless cardiology recommends due to fall risk/dementia.  Appreciate cardiology input on board.   Sepsis due to VRE UTI on Zyvox x7 days starting 1/12.  Noted worsening WBC count,-repeated procalcitonin level and is normal. She is on zyvox, ?ongoing aspiration, is npo on ngt feeding. Recent Labs  Lab 07/08/20 0137 07/08/20 1828 07/10/20 0037 07/11/20 0556 07/12/20 0140 07/13/20 0111 07/14/20 0335  WBC 16.1*   < > 9.5 14.1* 13.1* 11.3* 24.0*  PROCALCITON 0.32  --   --   --   --   --  <0.10   < > = values in this interval not displayed.   Dysphagia with narrowing of distal esophagus just above GE junction in barium swallow, history of esophageal strictures needing dilation in the past.Seen by GI and signed off.  Speech following on core track tube feeding.Tolerating  tube fluid.  AKI on CKD?? baseline/with metabolic acidosis: Creatinine peaked at 2.2, now improved.  Good output good output with Lasix.  Recent Labs  Lab 07/10/20 0037 07/11/20 0556 07/12/20 0140 07/13/20 0111 07/14/20 0335  BUN 68* 63*  57* 42* 32*  CREATININE 1.83* 1.61* 1.47* 1.41* 1.09*   Hyperkalemia:resolved.    History of diabetes mellitus hold home metformin,  A1c stable 5.3.  Blood sugar is fairly controlled on sliding scale.   Recent Labs  Lab 07/13/20 1614 07/13/20 2009 07/14/20 0011 07/14/20 0354 07/14/20 0747  GLUCAP 155* 140* 200* 185* 158*   Hypothyroidism-continue Synthroid  Lower extremity swelling 2+ negative for DVT  GOC: Patient is a hospice candidate but family wants to proceed with full scope of treatment including intubation but DNR, short-term to feed.Palliative care including intubation but DNR.  Pulmonary following closely.   Nutrition: Diet Order            Diet NPO time specified Except for: Sips with Meds  Diet effective midnight                 Body mass index is 22.44 kg/m.  DVT prophylaxis: heparin Code Status:   Code Status: Partial Code  Family Communication: plan of care discussed with patient/S DAUGTER 1/13. called son 1/15- no answer. called Rex and updated about her condition.  Status is: Inpatient Remains inpatient appropriate because:IV treatments appropriate due to intensity of illness or inability to take PO and Inpatient level of care appropriate due to severity of illness, on NGT feeding, ongoing resp failure  Dispo: The patient is from: hospice              Anticipated d/c is to: TBD              Anticipated d/c date is: 3 days              Patient currently is not medically stable to d/c.  Consultants:see note  Procedures:see note  Culture/Microbiology    Component Value Date/Time   SDES IN/OUT CATH URINE 07/19/2020 1623   SPECREQUEST NONE 07/04/2020 1623   CULT (A) 07/03/2020 1623    >=100,000 COLONIES/mL VANCOMYCIN RESISTANT ENTEROCOCCUS >=100,000 COLONIES/mL GRANULICATELLA ADIACENS Standardized susceptibility testing for this organism is not available. Performed at [facility], [place] [address], [place], [place] [address]    REPTSTATUS  07/10/2020 FINAL 07/04/2020 1623    Other culture-see note  Medications: Scheduled Meds: . apixaban  2.5 mg Per Tube BID  . chlorhexidine gluconate (MEDLINE KIT)  15 mL Mouth Rinse BID  . Chlorhexidine Gluconate Cloth  6 each Topical Daily  . diltiazem  30 mg Per Tube Q6H  . docusate  100 mg Per Tube BID  . furosemide  40 mg Per Tube Daily  . insulin aspart  0-9 Units Subcutaneous Q4H  . levothyroxine  25 mcg Per Tube QAC breakfast  . polyethylene glycol  17 g Per Tube Daily  . Resource ThickenUp Clear   Oral Once   Continuous Infusions: . feeding supplement (OSMOLITE 1.2 CAL) 55 mL/hr at 07/14/20 0338  . linezolid (ZYVOX) IV 600 mg (07/14/20 6010)    Antimicrobials: Anti-infectives (From admission, onward)   Start     Dose/Rate Route Frequency Ordered Stop   07/10/20 1015  linezolid (ZYVOX) IVPB 600 mg        600 mg 300 mL/hr over 60 Minutes Intravenous Every 12 hours 07/10/20 0923 07/17/20 0959   07/09/20 0345  vancomycin (VANCOREADY) IVPB  750 mg/150 mL        750 mg 150 mL/hr over 60 Minutes Intravenous  Once 07/09/20 0250 07/09/20 0506   07/07/20 2300  ceFEPIme (MAXIPIME) 2 g in sodium chloride 0.9 % 100 mL IVPB        2 g 200 mL/hr over 30 Minutes Intravenous Every 24 hours 07/10/2020 2331 07/12/20 0633   07/21/2020 2331  vancomycin variable dose per unstable renal function (pharmacist dosing)  Status:  Discontinued         Does not apply See admin instructions 07/21/2020 2331 07/10/20 0923   07/12/2020 2330  vancomycin (VANCOREADY) IVPB 1250 mg/250 mL        1,250 mg 166.7 mL/hr over 90 Minutes Intravenous  Once 07/05/2020 2325 07/07/20 0317   07/21/2020 2330  ceFEPIme (MAXIPIME) 2 g in sodium chloride 0.9 % 100 mL IVPB        2 g 200 mL/hr over 30 Minutes Intravenous  Once 07/02/2020 2325 07/07/20 0024   07/29/2020 1600  cefTRIAXone (ROCEPHIN) 2 g in sodium chloride 0.9 % 100 mL IVPB  Status:  Discontinued        2 g 200 mL/hr over 30 Minutes Intravenous Every 24 hours 07/26/2020  1557 07/15/2020 2325   07/29/2020 1600  azithromycin (ZITHROMAX) 500 mg in sodium chloride 0.9 % 250 mL IVPB  Status:  Discontinued        500 mg 250 mL/hr over 60 Minutes Intravenous Every 24 hours 07/04/2020 1557 07/09/20 0807     Objective: Vitals: Today's Vitals   07/14/20 0400 07/14/20 0451 07/14/20 0808 07/14/20 0856  BP: (!) 156/95  (!) 175/88   Pulse: 94  98   Resp: (!) 21  (!) 24   Temp: 98.8 F (37.1 C)  98.7 F (37.1 C)   TempSrc: Oral  Oral   SpO2: 100%  92%   Weight:  59.3 kg    Height:      PainSc:    0-No pain    Intake/Output Summary (Last 24 hours) at 07/14/2020 1007 Last data filed at 07/14/2020 0850 Gross per 24 hour  Intake 853.21 ml  Output 1450 ml  Net -596.79 ml   Filed Weights   07/13/20 0500 07/13/20 1959 07/14/20 0451  Weight: 59.9 kg 59.2 kg 59.3 kg   Weight change: -0.7 kg  Intake/Output from previous day: 01/15 0701 - 01/16 0700 In: 1017 [I.V.:52.3; NG/GT:571.6; IV Piggyback:393.2] Out: 9735 [Urine:1450] Intake/Output this shift: No intake/output data recorded.  Examination: General exam: Alert awake responding to answer questions, on 6 L, breathing through the mouth, core track in place  HEENT:Oral mucosa moist, Ear/Nose WNL grossly, dentition normal. Respiratory system: bilaterally clear,no wheezing or crackles,no use of accessory muscle Cardiovascular system: S1 & S2 +, No JVD,. Gastrointestinal system: Abdomen soft, NT,ND, BS+ Nervous System:Alert, awake, moving all extremities . Extremities: No edema, distal peripheral pulses palpable.  Skin: No rashes,no icterus. MSK: Normal muscle bulk,tone, power  Data Reviewed: I have personally reviewed following labs and imaging studies CBC: Recent Labs  Lab 07/10/20 0037 07/11/20 0556 07/12/20 0140 07/13/20 0111 07/14/20 0335  WBC 9.5 14.1* 13.1* 11.3* 24.0*  HGB 10.3* 11.0* 10.7* 10.9* 11.9*  HCT 35.8* 36.6 34.9* 36.5 40.3  MCV 92.5 88.2 89.7 88.0 89.6  PLT 256 314 256 317 329    Basic Metabolic Panel: Recent Labs  Lab 07/09/20 0048 07/09/20 0920 07/10/20 0037 07/11/20 0556 07/12/20 0140 07/12/20 1431 07/13/20 0111 07/13/20 1625 07/14/20 0335  NA  --  130*  133* 134* 135  --  137  --  137  K  --  5.0 5.3* 4.5 4.6  --  3.9  --  4.0  CL  --  99 101 102 103  --  98  --  98  CO2  --  20* 20* 19* 18*  --  26  --  27  GLUCOSE  --  121* 149* 84 160*  --  254*  --  176*  BUN  --  68* 68* 63* 57*  --  42*  --  32*  CREATININE  --  1.89* 1.83* 1.61* 1.47*  --  1.41*  --  1.09*  CALCIUM  --  8.2* 8.4* 8.7* 8.7*  --  9.0  --  8.6*  MG 2.1 2.1 2.2  --   --  2.0 1.7 2.2  --   PHOS 4.0  --  4.2  --   --  2.4* 1.7* 1.7*  --    GFR: Estimated Creatinine Clearance: 32 mL/min (A) (by C-G formula based on SCr of 1.09 mg/dL (H)). Liver Function Tests: Recent Labs  Lab 07/08/20 1828  AST 122*  ALT 100*  ALKPHOS 168*  BILITOT 0.5  PROT 5.8*  ALBUMIN 2.7*   No results for input(s): LIPASE, AMYLASE in the last 168 hours. No results for input(s): AMMONIA in the last 168 hours. Coagulation Profile: No results for input(s): INR, PROTIME in the last 168 hours. Cardiac Enzymes: No results for input(s): CKTOTAL, CKMB, CKMBINDEX, TROPONINI in the last 168 hours. BNP (last 3 results) No results for input(s): PROBNP in the last 8760 hours. HbA1C: No results for input(s): HGBA1C in the last 72 hours. CBG: Recent Labs  Lab 07/13/20 1614 07/13/20 2009 07/14/20 0011 07/14/20 0354 07/14/20 0747  GLUCAP 155* 140* 200* 185* 158*   Lipid Profile: No results for input(s): CHOL, HDL, LDLCALC, TRIG, CHOLHDL, LDLDIRECT in the last 72 hours. Thyroid Function Tests: No results for input(s): TSH, T4TOTAL, FREET4, T3FREE, THYROIDAB in the last 72 hours. Anemia Panel: No results for input(s): VITAMINB12, FOLATE, FERRITIN, TIBC, IRON, RETICCTPCT in the last 72 hours. Sepsis Labs: Recent Labs  Lab 07/08/20 0137 07/14/20 0335  PROCALCITON 0.32 <0.10    Recent Results (from  the past 240 hour(s))  Resp Panel by RT-PCR (Flu A&B, Covid) Nasopharyngeal Swab     Status: None   Collection Time: 07/22/2020  3:07 PM   Specimen: Nasopharyngeal Swab; Nasopharyngeal(NP) swabs in vial transport medium  Result Value Ref Range Status   SARS Coronavirus 2 by RT PCR NEGATIVE NEGATIVE Final    Comment: (NOTE) SARS-CoV-2 target nucleic acids are NOT DETECTED.  The SARS-CoV-2 RNA is generally detectable in upper respiratory specimens during the acute phase of infection. The lowest concentration of SARS-CoV-2 viral copies this assay can detect is 138 copies/mL. A negative result does not preclude SARS-Cov-2 infection and should not be used as the sole basis for treatment or other patient management decisions. A negative result may occur with  improper specimen collection/handling, submission of specimen other than nasopharyngeal swab, presence of viral mutation(s) within the areas targeted by this assay, and inadequate number of viral copies(<138 copies/mL). A negative result must be combined with clinical observations, patient history, and epidemiological information. The expected result is Negative.  Fact Sheet for Patients:  BloggerCourse.com  Fact Sheet for Healthcare Providers:  SeriousBroker.it  This test is no t yet approved or cleared by the [place] FDA and  has been authorized for detection and/or diagnosis  of SARS-CoV-2 by FDA under an Emergency Use Authorization (EUA). This EUA will remain  in effect (meaning this test can be used) for the duration of the COVID-19 declaration under Section 564(b)(1) of the Act, 21 U.S.C.section 360bbb-3(b)(1), unless the authorization is terminated  or revoked sooner.       Influenza A by PCR NEGATIVE NEGATIVE Final   Influenza B by PCR NEGATIVE NEGATIVE Final    Comment: (NOTE) The Xpert Xpress SARS-CoV-2/FLU/RSV plus assay is intended as an aid in the diagnosis of  influenza from Nasopharyngeal swab specimens and should not be used as a sole basis for treatment. Nasal washings and aspirates are unacceptable for Xpert Xpress SARS-CoV-2/FLU/RSV testing.  Fact Sheet for Patients: EntrepreneurPulse.com.au  Fact Sheet for Healthcare Providers: IncredibleEmployment.be  This test is not yet approved or cleared by the [place] FDA and has been authorized for detection and/or diagnosis of SARS-CoV-2 by FDA under an Emergency Use Authorization (EUA). This EUA will remain in effect (meaning this test can be used) for the duration of the COVID-19 declaration under Section 564(b)(1) of the Act, 21 U.S.C. section 360bbb-3(b)(1), unless the authorization is terminated or revoked.  Performed at [facility], [place] [address] [place], [place] [address]   Blood culture (routine single)     Status: None   Collection Time: 07/16/2020  3:55 PM   Specimen: BLOOD RIGHT FOREARM  Result Value Ref Range Status   Specimen Description BLOOD RIGHT FOREARM  Final   Special Requests   Final    BOTTLES DRAWN AEROBIC AND ANAEROBIC Blood Culture results may not be optimal due to an excessive volume of blood received in culture bottles   Culture   Final    NO GROWTH 5 DAYS Performed at [facility], [place] [address], [place], [place] [address]    Report Status 07/11/2020 FINAL  Final  Culture, blood (single)     Status: None   Collection Time: 07/28/2020  3:59 PM   Specimen: BLOOD  Result Value Ref Range Status   Specimen Description BLOOD LEFT ANTECUBITAL  Final   Special Requests   Final    BOTTLES DRAWN AEROBIC AND ANAEROBIC Blood Culture adequate volume   Culture   Final    NO GROWTH 5 DAYS Performed at [facility], [place] [address] [place] [address], [place], [place] [address]    Report Status 07/11/2020 FINAL  Final  Urine culture     Status: Abnormal   Collection Time: 07/15/2020  4:23 PM   Specimen: In/Out Cath Urine  Result  Value Ref Range Status   Specimen Description IN/OUT CATH URINE  Final   Special Requests NONE  Final   Culture (A)  Final    >=100,000 COLONIES/mL VANCOMYCIN RESISTANT ENTEROCOCCUS >=100,000 COLONIES/mL GRANULICATELLA ADIACENS Standardized susceptibility testing for this organism is not available. Performed at [facility], [place] [address], [place], [place] [address]    Report Status 07/10/2020 FINAL  Final   Organism ID, Bacteria VANCOMYCIN RESISTANT ENTEROCOCCUS (A)  Final      Susceptibility   Vancomycin resistant enterococcus - MIC*    AMPICILLIN >=32 RESISTANT Resistant     NITROFURANTOIN 32 SENSITIVE Sensitive     VANCOMYCIN >=32 RESISTANT Resistant     LINEZOLID 2 SENSITIVE Sensitive     * >=100,000 COLONIES/mL VANCOMYCIN RESISTANT ENTEROCOCCUS  MRSA PCR Screening     Status: Abnormal   Collection Time: 07/09/20  4:16 AM   Specimen: Nasal Mucosa; Nasopharyngeal  Result Value  Ref Range Status   MRSA by PCR POSITIVE (A) NEGATIVE Final    Comment:        The GeneXpert MRSA Assay (FDA approved for NASAL specimens only), is one component of a comprehensive MRSA colonization surveillance program. It is not intended to diagnose MRSA infection nor to guide or monitor treatment for MRSA infections. RESULT CALLED TO, READ BACK BY AND VERIFIED WITH: A CAIN RN 07/09/20 2158 JDW Performed at [facility], [place] [address] [place], [place] [address]      Radiology Studies: No results found.   LOS: 8 days   Antonieta Pert, MD [facility]  07/14/2020, 10:07 AM

## 2020-07-15 DIAGNOSIS — J449 Chronic obstructive pulmonary disease, unspecified: Secondary | ICD-10-CM

## 2020-07-15 DIAGNOSIS — J9621 Acute and chronic respiratory failure with hypoxia: Secondary | ICD-10-CM | POA: Diagnosis not present

## 2020-07-15 DIAGNOSIS — I10 Essential (primary) hypertension: Secondary | ICD-10-CM | POA: Diagnosis not present

## 2020-07-15 DIAGNOSIS — I4891 Unspecified atrial fibrillation: Secondary | ICD-10-CM | POA: Diagnosis not present

## 2020-07-15 DIAGNOSIS — E1169 Type 2 diabetes mellitus with other specified complication: Secondary | ICD-10-CM | POA: Diagnosis not present

## 2020-07-15 DIAGNOSIS — J9622 Acute and chronic respiratory failure with hypercapnia: Secondary | ICD-10-CM | POA: Diagnosis not present

## 2020-07-15 LAB — GLUCOSE, CAPILLARY
Glucose-Capillary: 153 mg/dL — ABNORMAL HIGH (ref 70–99)
Glucose-Capillary: 153 mg/dL — ABNORMAL HIGH (ref 70–99)
Glucose-Capillary: 167 mg/dL — ABNORMAL HIGH (ref 70–99)
Glucose-Capillary: 176 mg/dL — ABNORMAL HIGH (ref 70–99)
Glucose-Capillary: 200 mg/dL — ABNORMAL HIGH (ref 70–99)
Glucose-Capillary: 225 mg/dL — ABNORMAL HIGH (ref 70–99)

## 2020-07-15 LAB — CBC
HCT: 38.8 % (ref 36.0–46.0)
Hemoglobin: 11.3 g/dL — ABNORMAL LOW (ref 12.0–15.0)
MCH: 26.4 pg (ref 26.0–34.0)
MCHC: 29.1 g/dL — ABNORMAL LOW (ref 30.0–36.0)
MCV: 90.7 fL (ref 80.0–100.0)
Platelets: 279 10*3/uL (ref 150–400)
RBC: 4.28 MIL/uL (ref 3.87–5.11)
RDW: 17.2 % — ABNORMAL HIGH (ref 11.5–15.5)
WBC: 18 10*3/uL — ABNORMAL HIGH (ref 4.0–10.5)
nRBC: 0 % (ref 0.0–0.2)

## 2020-07-15 LAB — PROCALCITONIN: Procalcitonin: <0.1 ng/mL

## 2020-07-15 MED ORDER — FUROSEMIDE 10 MG/ML IJ SOLN
INTRAMUSCULAR | Status: AC
  Administered 2020-07-15: 20 mg via INTRAVENOUS
  Filled 2020-07-15: qty 2

## 2020-07-15 MED ORDER — AMIODARONE HCL IN DEXTROSE 360-4.14 MG/200ML-% IV SOLN
60.0000 mg/h | INTRAVENOUS | Status: AC
  Administered 2020-07-15: 60 mg/h via INTRAVENOUS
  Filled 2020-07-15: qty 200

## 2020-07-15 MED ORDER — DILTIAZEM HCL 60 MG PO TABS
60.0000 mg | ORAL_TABLET | Freq: Four times a day (QID) | ORAL | Status: DC
  Administered 2020-07-15 – 2020-07-17 (×10): 60 mg
  Filled 2020-07-15 (×12): qty 1

## 2020-07-15 MED ORDER — HYDRALAZINE HCL 10 MG PO TABS
10.0000 mg | ORAL_TABLET | Freq: Four times a day (QID) | ORAL | Status: DC | PRN
  Administered 2020-07-16: 10 mg via ORAL
  Filled 2020-07-15: qty 1

## 2020-07-15 MED ORDER — FUROSEMIDE 10 MG/ML IJ SOLN: 20.0000 mg | Freq: Once | INTRAMUSCULAR | Status: AC

## 2020-07-15 MED ORDER — FREE WATER
110.0000 mL | Freq: Four times a day (QID) | Status: DC
  Administered 2020-07-15 – 2020-07-18 (×12): 110 mL

## 2020-07-15 MED ORDER — SODIUM CHLORIDE 0.9 % IV SOLN
INTRAVENOUS | Status: DC | PRN
  Administered 2020-07-15: 250 mL via INTRAVENOUS

## 2020-07-15 MED ORDER — FUROSEMIDE 10 MG/ML IJ SOLN: 20.0000 mg | Freq: Once | INTRAMUSCULAR | Status: DC

## 2020-07-15 MED ORDER — PROSOURCE TF PO LIQD
45.0000 mL | Freq: Every day | ORAL | Status: DC
  Administered 2020-07-15 – 2020-07-18 (×4): 45 mL
  Filled 2020-07-15 (×4): qty 45

## 2020-07-15 MED ORDER — AMIODARONE LOAD VIA INFUSION
150.0000 mg | Freq: Once | INTRAVENOUS | Status: AC
  Administered 2020-07-15: 150 mg via INTRAVENOUS
  Filled 2020-07-15: qty 83.34

## 2020-07-15 MED ORDER — DILTIAZEM HCL 60 MG PO TABS: 60.0000 mg | ORAL_TABLET | Freq: Four times a day (QID) | ORAL | Status: DC

## 2020-07-15 MED ORDER — AMIODARONE HCL IN DEXTROSE 360-4.14 MG/200ML-% IV SOLN
30.0000 mg/h | INTRAVENOUS | Status: DC
  Administered 2020-07-15 – 2020-07-17 (×5): 30 mg/h via INTRAVENOUS
  Filled 2020-07-15 (×4): qty 200

## 2020-07-15 NOTE — Progress Notes (Addendum)
Progress Note  Patient Name: Hannah [name] Date of Encounter: 07/15/2020  [facility] Cardiologist: Mertie Moores, MD   Subjective   Laying in bed with mittens on in NAD. She reports feeling SOB but denies chest pain or palpitations.  Inpatient Medications    Scheduled Meds: . apixaban  2.5 mg Per Tube BID  . chlorhexidine gluconate (MEDLINE KIT)  15 mL Mouth Rinse BID  . Chlorhexidine Gluconate Cloth  6 each Topical Daily  . diltiazem  60 mg Oral Q6H  . docusate  100 mg Per Tube BID  . feeding supplement (PROSource TF)  45 mL Per Tube Daily  . free water  110 mL Per Tube Q6H  . furosemide  40 mg Per Tube Daily  . insulin aspart  0-9 Units Subcutaneous Q4H  . levothyroxine  25 mcg Per Tube QAC breakfast  . polyethylene glycol  17 g Per Tube Daily  . Resource ThickenUp Clear   Oral Once   Continuous Infusions: . feeding supplement (OSMOLITE 1.2 CAL) 1,000 mL (07/15/20 0951)  . linezolid (ZYVOX) IV 600 mg (07/14/20 2356)   PRN Meds: acetaminophen **OR** acetaminophen, albuterol, haloperidol lactate, labetalol, Racepinephrine HCl   Vital Signs    Vitals:   07/14/20 2206 07/14/20 2207 07/15/20 0435 07/15/20 0825  BP:   (!) 166/61 (!) 179/87  Pulse: (!) 102 62 98   Resp: (!) 30 (!) 26 (!) 21   Temp:   98.6 F (37 C)   TempSrc:   Oral   SpO2: 100% 100% 100%   Weight:   59.5 kg   Height:        Intake/Output Summary (Last 24 hours) at 07/15/2020 1137 Last data filed at 07/15/2020 0450 Gross per 24 hour  Intake 872.84 ml  Output 500 ml  Net 372.84 ml   Last 3 Weights 07/15/2020 07/14/2020 07/13/2020  Weight (lbs) 131 lb 2.8 oz 130 lb 11.7 oz 130 lb 8.2 oz  Weight (kg) 59.5 kg 59.3 kg 59.2 kg      Telemetry    Atrial fibrillation with RVR with rates up to 150s at times - Personally Reviewed  ECG    No new tracings - Personally Reviewed  Physical Exam   GEN: frail elderly female appearing mildly short of breath.   Neck: No JVD Cardiac: IRIR, +murmur, no  rubs or gallops.  Respiratory: coarse upper airway sounds anteriorly with diminished breath sounds posteriorly GI: Soft, nontender, non-distended  MS: No edema; No deformity. Neuro:  Nonfocal  Psych: demented, calm, in mittens  Labs    High Sensitivity Troponin:   Recent Labs  Lab 07/08/20 1828 07/08/20 2028  TROPONINIHS 61* 59*      Chemistry Recent Labs  Lab 07/08/20 1828 07/08/20 2023 07/12/20 0140 07/13/20 0111 07/14/20 0335  NA 130*   < > 135 137 137  K 5.8*   < > 4.6 3.9 4.0  CL 98   < > 103 98 98  CO2 17*   < > 18* 26 27  GLUCOSE 231*   < > 160* 254* 176*  BUN 67*   < > 57* 42* 32*  CREATININE 2.23*   < > 1.47* 1.41* 1.09*  CALCIUM 7.9*   < > 8.7* 9.0 8.6*  PROT 5.8*  --   --   --   --   ALBUMIN 2.7*  --   --   --   --   AST 122*  --   --   --   --  ALT 100*  --   --   --   --   ALKPHOS 168*  --   --   --   --   BILITOT 0.5  --   --   --   --   GFRNONAA 21*   < > 35* 36* 49*  ANIONGAP 15   < > _0 < > = values in this interval not displayed.     Hematology Recent Labs  Lab 07/13/20 0111 07/14/20 0335 07/15/20 0254  WBC 11.3* 24.0* 18.0*  RBC 4.15 4.50 4.28  HGB 10.9* 11.9* 11.3*  HCT 36.5 40.3 38.8  MCV 88.0 89.6 90.7  MCH 26.3 26.4 26.4  MCHC 29.9* 29.5* 29.1*  RDW 16.8* 17.2* 17.2*  PLT 317 348 279    BNPNo results for input(s): BNP, PROBNP in the last 168 hours.   DDimer No results for input(s): DDIMER in the last 168 hours.   Radiology    DG Chest Port 1 View  Result Date: 07/14/2020 CLINICAL DATA:  Fluid overload. EXAM: PORTABLE CHEST 1 VIEW COMPARISON:  July 11, 2020. FINDINGS: Stable cardiomediastinal silhouette. Feeding tube is seen entering stomach. No pneumothorax is noted. Mild bibasilar atelectasis is noted with probable small pleural effusions. Bony thorax is unremarkable. IMPRESSION: Mild bibasilar subsegmental atelectasis with probable small pleural effusions. Electronically Signed   By: Marijo Conception M.D.   On:  07/14/2020 10:24    Cardiac Studies   Echocardiogram 07/08/20: 1. Left ventricular ejection fraction, by estimation, is 50 to 55%. The  left ventricle has low normal function. The left ventricle demonstrates  global hypokinesis. There is moderate concentric left ventricular  hypertrophy. Left ventricular diastolic  function could not be evaluated.  2. Right ventricular systolic function is mildly reduced. The right  ventricular size is not well visualized. There is moderately elevated  pulmonary artery systolic pressure. The estimated right ventricular  systolic pressure is 33.0 mmHg.  3. Left atrial size was mild to moderately dilated.  4. The mitral valve is degenerative. Moderate mitral valve regurgitation.  No evidence of mitral stenosis. Moderate mitral annular calcification.  5. The aortic valve is tricuspid. There is severe calcifcation of the  aortic valve. There is moderate thickening of the aortic valve. The non  coronary cusp is immobile. Aortic valve regurgitation is mild. Mild to  moderate aortic valve stenosis. Aortic  valve area, by VTI measures 1.23 cm. Aortic valve mean gradient measures  9.8 mmHg. Aortic valve Vmax measures 1.98 m/s. The DI is 0.54. The AVA is  underestimated due to small LVOT measurement but likely more than mild AS  as the SVI is low at 26.  Findings consistent with low flow low gradient Aortic stenosis.  6. The inferior vena cava is dilated in size with <50% respiratory  variability, suggesting right atrial pressure of 15 mmHg.  7. Trivial pericardial effusion anterior to the right ventricle.   Patient Profile     87 y.o. female with PMH of HTN, HLD, DM type 2, COPD on home O2 3L, and dementia who is being followed by cardiology for atrial fibrillation with RVR  Assessment & Plan    1. Atrial fibrillation with RVR: rates remain poorly controlled. Suspect this is driven by underlying UTI. She is on diltiazem 36m q6hr. BBlocker avoided  due to COPD history.  - Will start amiodarone gtt for rate/rhythm control - Continue diltiazem for rate control - Can continue eliquis 2.547mBID (adjusted for age and  weight), though ultimately is a poor candidate at home due to fall risk.   2. Sepsis due to VRE UTI: currently on linezolid.  - Continue management per primary team.  3. Acute on chronic respiratory failure in patient with COPD on home O2: patient initially required BiPAP on arrival, briefly intubated this admission, and now continues to require intermittent BiPAP. She remains supportive care with nebulizers and po lasix - Continue management per primary team  4. Dysphagia: barium swall with narrowing of distal esophagus. Seen by GI and high risk for EGD. Conservative care recommended. She has a nasal feeding tube in place - Continue management per primary team  5. HTN: BP persistently elevated.  - Will add prn hydralazine and continue to monitor   Remainder of care per primary team: - Hypothyroidism - DM type 2 - Encephalopathy         For questions or updates, please contact [place] Please consult [email or web address] for contact info under        Signed, Abigail Butts, PA-C  07/15/2020, 11:37 AM    Personally seen and examined. Agree with APP above with the following comments: Briefly 87 yo F with dementia and AF RVR Patient notes no response, but when asked how we can best help her shrugs her shoulders.  Smiles but not verbally responsive on exam Exam notable for irregularly irregular rhythm. Labs notable for improvement in creatinine over coarse Would recommend amiodarone bolus and rip with transition to PO through course:  Will likely not have to work up about long term lung complications in the setting of overall prognosis.  Presently, her rates due not appear to have symptomatic or hemodynamic consequences.  Patient does have an elevated stroke risk, but her anticoagulation in the setting of her  overall goal of care may warrant further discussion.  Werner Lean, MD

## 2020-07-15 NOTE — Progress Notes (Signed)
  Speech Language Pathology Treatment: Dysphagia  Patient Details Name: Hannah [name] MRN: [id number] DOB: 22-Apr-1932 Today's Date: 07/15/2020 Time: 2979-8921 SLP Time Calculation (min) (ACUTE ONLY): 14 min  Assessment / Plan / Recommendation Clinical Impression  No real clinical change in swallowing function today.  Pt asking for water.  Provided oral care - oral mucosa quite dry.  Raised HOB, provided with ice chips - pt with continuous coughing after trials, likely aspiration given results of Friday's MBS.  Today she is tachypneic up to the 40s, presenting further concerns for the coordination of swallowing/breathing cycles.    Recommend continuing oral care QD and allowing ice chips PRN to help with overall health of oral mucosa and to satisfy her a little.  She is desperate for water.  I'm concerned that her dysphagia is not going to improve sufficiently to eliminate concerns for aspiration, particularly with degree of esophageal involvement.   Will follow along for progress and GOC. D/W RN.   HPI HPI: 87 year old female from nursing home coming in with sepsis due to multifocal pneumonia and acute hypoxic respiratory failure. Clinical swallow evaluation was completed 1/09 with concerns for esophageal dysphagia, no real concerns for oropharyngeal dysphagia; full liquids recommended pending GI consult. Evening of 1/10, she had desaturation to the point of near arrest.  Code blue called and pt intubated by anesthesia before being transferred to [facility] ICU. Per Dr. Eugenia Pancoast note 1/12:  Pt "had a smooth narrowing of her distal esophagus on Barium esophagram 2 days ago.  Ideally I would recommend EGD for further evaluation however she is at quite [place] for any invasive procedures: chronically on 3LNC, she has pneumonia now, she just suffered a respiratory arrest and her Baseline dementia obviously complicates the picture as well."      SLP Plan  Continue with current plan of care        Recommendations  Diet recommendations: NPO (allow ice chips) Medication Administration: Via alternative means                Oral Care Recommendations: Oral care QID Follow up Recommendations: 24 hour supervision/assistance Plan: Continue with current plan of care       [place]. Tivis Ringer, [place] Office number [phone] Pager [phone]   Hannah [name] 07/15/2020, 10:48 AM
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
## 2020-07-15 NOTE — Progress Notes (Signed)
PROGRESS NOTE    Hannah [name]  VEL:[id number] DOB: Aug 09, 1931 DOA: 07/16/2020 PCP: Street, [name], MD   Chief Complaint  Patient presents with  . Shortness of Breath   Brief Narrative: 87 y.o. female with medical history significant of DM2, COPD on 3L at baseline, GERD, HLD, HTN, dementia, wheelchair-bound who was enrolled in hospice of [name] prior to admission who was recently diagnosed with pneumonia and was started on antibiotics on day of admission brought to the ED  07/21/2020 with hypoxia 79% RA, and was admitted for septic shock due to CAP, new onset of A.fib, and had desaturation event to the point of near arrest on 1/30 CODE BLUE called intubated and transferred to ICU. 1/9 GI consult for dysphagia > barium esophogram ordered before deciding on next steps (? EGD, dilation for stricture etc) GI has signed off since then due to poor respiratory status and other complex comorbidities, PT OT speech has been working. 1/1o- patient was intubated and extubated on 1/11. 1/13- tx to [facility], but was in resp distress needing bipap, persistent A. fib placed on Cardizem drip, seen by cardiology, 1/14- started diuresis, cortark tube-with significant diuresis and patient overall clinically improved.  Transferred to progressive bed 1/15  Subjective: Alert,awake, follows commands, Moving UE , gave me thumbs up sign.  on o2, hr not controlled In 140s. WBC downtrending  Assessment & Plan:  Acute on chronic hypoxic respiratory failure/COPD with chronic hypoxia at home on 3l [place]: 2/2 advanced COPD,chronic aspiration syndrome and volume overload:  extubated on 1/11 needed BiPAP 1/13. CXR-good diuresis with w/ iv lasix 07/12/20, changed to p.o. Lasix, respiratory status overall is stable but still needing oxygen, on 4 L nasal cannula.  Procalcitonin negative, repeat chest x-ray-mild bibasilar subsegmental atelectasis with probable small pleural effusions.  Continue oral Lasix, bronchodilators antibiotics.   Seen by pulmonary and signed off. Net IO Since Admission: 1,296.94 mL [07/15/20 1051]  Intake/Output Summary (Last 24 hours) at 07/15/2020 1051 Last data filed at 07/15/2020 0450 Gross per 24 hour  Intake 872.84 ml  Output 500 ml  Net 372.84 ml   Acute metabolic encephalopathy likely multifactorial with failure to thrive, delirium hospital-acquired in the setting of dementia mental status alert awake answering question with whispers, follows commands  New onset A. fib with RVR/history of systolic CHF with EF 50 to 55%/hyperlipidemia: Rate poorly controlled, increase Cardizem, continue Eliquis, asked cardiac to be on board given poorly controlled heart rate.  Patient was switched from heparin to Eliquis in ICU but is a poor candidate for long-term anticoagulation and will likely not continue on discharge unless cardiology recommends due to fall risk/dementia.    Sepsis due to VRE UTI on Zyvox x7 days starting 1/12.  Noted worsening WBC count,-repeated procalcitonin level and is normal. She is on zyvox, ?ongoing aspiration, is npo on ngt feeding.  Repeat WBC improving. Recent Labs  Lab 07/11/20 0556 07/12/20 0140 07/13/20 0111 07/14/20 0335 07/15/20 0254  WBC 14.1* 13.1* 11.3* 24.0* 18.0*  PROCALCITON  --   --   --  <0.10 <0.10   Dysphagia with narrowing of distal esophagus just above GE junction in barium swallow, history of esophageal strictures needing dilation in the past.Seen by GI and signed off.  Planning for MBS today. On core track tube feeds since 1/14.  AKI on CKD?? baseline/with metabolic acidosis: Creatinine peaked at 2.2, creatinine improved to 1.0.  Repeat labs.  Monitor  Recent Labs  Lab 07/10/20 0037 07/11/20 0556 07/12/20 0140 07/13/20 0111  07/14/20 0335  BUN 68* 63* 57* 42* 32*  CREATININE 1.83* 1.61* 1.47* 1.41* 1.09*   Hyperkalemia: k stable  History of diabetes mellitus hold home metformin,  A1c stable 5.3.  Blood sugar controlled on sliding scale  insulin. Recent Labs  Lab 07/14/20 1631 07/14/20 2021 07/15/20 0004 07/15/20 0428 07/15/20 0949  GLUCAP 187* 147* 153* 153* 167*   Hypothyroidism-ON Synthroid  Lower extremity swelling 2+ negative for DVT  GOC: Patient is a hospice candidate but family wants to proceed with full scope of treatment including intubation but DNR, short-term to feed.Palliative care including intubation but DNR.  Pulmonary following closely.   Nutrition: Diet Order            Diet NPO time specified Except for: Sips with Meds  Diet effective midnight                 Body mass index is 22.52 kg/m.  DVT prophylaxis: heparin Code Status:   Code Status: Partial Code  Family Communication: plan of care discussed with patient/S DAUGTER 1/13. called son 1/15- no answer. called Rex and updated about her condition 1/16.  Status is: Inpatient Remains inpatient appropriate because:IV treatments appropriate due to intensity of illness or inability to take PO and Inpatient level of care appropriate due to severity of illness, on NGT feeding, ongoing resp failure  Dispo: The patient is from: hospice              Anticipated d/c is to: snf              Anticipated d/c date is:2 DAYS              Patient currently is not medically stable to d/c.  Consultants:see note  Procedures:see note  Culture/Microbiology    Component Value Date/Time   SDES IN/OUT CATH URINE 07/09/2020 1623   SPECREQUEST NONE 07/15/2020 1623   CULT (A) 07/29/2020 1623    >=100,000 COLONIES/mL VANCOMYCIN RESISTANT ENTEROCOCCUS >=100,000 COLONIES/mL GRANULICATELLA ADIACENS Standardized susceptibility testing for this organism is not available. Performed at [facility], [place] [address], [place], [place] [address]    REPTSTATUS 07/10/2020 FINAL 07/21/2020 1623    Other culture-see note  Medications: Scheduled Meds: . apixaban  2.5 mg Per Tube BID  . chlorhexidine gluconate (MEDLINE KIT)  15 mL Mouth Rinse BID  .  Chlorhexidine Gluconate Cloth  6 each Topical Daily  . diltiazem  30 mg Per Tube Q6H  . docusate  100 mg Per Tube BID  . feeding supplement (PROSource TF)  45 mL Per Tube Daily  . free water  110 mL Per Tube Q6H  . furosemide  40 mg Per Tube Daily  . insulin aspart  0-9 Units Subcutaneous Q4H  . levothyroxine  25 mcg Per Tube QAC breakfast  . polyethylene glycol  17 g Per Tube Daily  . Resource ThickenUp Clear   Oral Once   Continuous Infusions: . feeding supplement (OSMOLITE 1.2 CAL) 1,000 mL (07/15/20 0951)  . linezolid (ZYVOX) IV 600 mg (07/14/20 2356)    Antimicrobials: Anti-infectives (From admission, onward)   Start     Dose/Rate Route Frequency Ordered Stop   07/10/20 1015  linezolid (ZYVOX) IVPB 600 mg        600 mg 300 mL/hr over 60 Minutes Intravenous Every 12 hours 07/10/20 0923 07/17/20 0959   07/09/20 0345  vancomycin (VANCOREADY) IVPB 750 mg/150 mL        750 mg 150 mL/hr over 60 Minutes  Intravenous  Once 07/09/20 0250 07/09/20 0506   07/07/20 2300  ceFEPIme (MAXIPIME) 2 g in sodium chloride 0.9 % 100 mL IVPB        2 g 200 mL/hr over 30 Minutes Intravenous Every 24 hours 07/09/2020 2331 07/12/20 0633   07/28/2020 2331  vancomycin variable dose per unstable renal function (pharmacist dosing)  Status:  Discontinued         Does not apply See admin instructions 07/16/2020 2331 07/10/20 0923   07/01/2020 2330  vancomycin (VANCOREADY) IVPB 1250 mg/250 mL        1,250 mg 166.7 mL/hr over 90 Minutes Intravenous  Once 07/07/2020 2325 07/07/20 0317   07/16/2020 2330  ceFEPIme (MAXIPIME) 2 g in sodium chloride 0.9 % 100 mL IVPB        2 g 200 mL/hr over 30 Minutes Intravenous  Once 07/05/2020 2325 07/07/20 0024   07/21/2020 1600  cefTRIAXone (ROCEPHIN) 2 g in sodium chloride 0.9 % 100 mL IVPB  Status:  Discontinued        2 g 200 mL/hr over 30 Minutes Intravenous Every 24 hours 07/07/2020 1557 07/07/2020 2325   07/09/2020 1600  azithromycin (ZITHROMAX) 500 mg in sodium chloride 0.9 % 250 mL  IVPB  Status:  Discontinued        500 mg 250 mL/hr over 60 Minutes Intravenous Every 24 hours 07/05/2020 1557 07/09/20 0807     Objective: Vitals: Today's Vitals   07/14/20 2206 07/14/20 2207 07/15/20 0435 07/15/20 0825  BP:   (!) 166/61 (!) 179/87  Pulse: (!) 102 62 98   Resp: (!) 30 (!) 26 (!) 21   Temp:   98.6 F (37 C)   TempSrc:   Oral   SpO2: 100% 100% 100%   Weight:   59.5 kg   Height:      PainSc:        Intake/Output Summary (Last 24 hours) at 07/15/2020 1051 Last data filed at 07/15/2020 0450 Gross per 24 hour  Intake 872.84 ml  Output 500 ml  Net 372.84 ml   Filed Weights   07/13/20 1959 07/14/20 0451 07/15/20 0435  Weight: 59.2 kg 59.3 kg 59.5 kg   Weight change: 0.3 kg  Intake/Output from previous day: 01/16 0701 - 01/17 0700 In: 872.8 [IV Piggyback:872.8] Out: 500 [Urine:500] Intake/Output this shift: No intake/output data recorded.  Examination: General exam: Alert awake able to respond for answer move upper extremities  HEENT:Oral mucosa moist, Ear/Nose WNL grossly, dentition normal. Respiratory system: bilaterally diminished,no use of accessory muscle Cardiovascular system: S1 & S2 +, No JVD,. Gastrointestinal system: Abdomen soft, NT,ND, BS+ Nervous System:Alert, awake, moving UE well, weak LE Extremities: No edema, distal peripheral pulses palpable.  Skin: No rashes,no icterus. MSK: Normal muscle bulk,tone, power  Data Reviewed: I have personally reviewed following labs and imaging studies CBC: Recent Labs  Lab 07/11/20 0556 07/12/20 0140 07/13/20 0111 07/14/20 0335 07/15/20 0254  WBC 14.1* 13.1* 11.3* 24.0* 18.0*  HGB 11.0* 10.7* 10.9* 11.9* 11.3*  HCT 36.6 34.9* 36.5 40.3 38.8  MCV 88.2 89.7 88.0 89.6 90.7  PLT 314 256 317 348 295   Basic Metabolic Panel: Recent Labs  Lab 07/09/20 0920 07/10/20 0037 07/11/20 0556 07/12/20 0140 07/12/20 1431 07/13/20 0111 07/13/20 1625 07/14/20 0335  NA 130* 133* 134* 135  --  137  --  137   K 5.0 5.3* 4.5 4.6  --  3.9  --  4.0  CL 99 101 102 103  --  98  --  98  CO2 20* 20* 19* 18*  --  26  --  27  GLUCOSE 121* 149* 84 160*  --  254*  --  176*  BUN 68* 68* 63* 57*  --  42*  --  32*  CREATININE 1.89* 1.83* 1.61* 1.47*  --  1.41*  --  1.09*  CALCIUM 8.2* 8.4* 8.7* 8.7*  --  9.0  --  8.6*  MG 2.1 2.2  --   --  2.0 1.7 2.2  --   PHOS  --  4.2  --   --  2.4* 1.7* 1.7* 2.0*   GFR: Estimated Creatinine Clearance: 32 mL/min (A) (by C-G formula based on SCr of 1.09 mg/dL (H)). Liver Function Tests: Recent Labs  Lab 07/08/20 1828  AST 122*  ALT 100*  ALKPHOS 168*  BILITOT 0.5  PROT 5.8*  ALBUMIN 2.7*   No results for input(s): LIPASE, AMYLASE in the last 168 hours. No results for input(s): AMMONIA in the last 168 hours. Coagulation Profile: No results for input(s): INR, PROTIME in the last 168 hours. Cardiac Enzymes: No results for input(s): CKTOTAL, CKMB, CKMBINDEX, TROPONINI in the last 168 hours. BNP (last 3 results) No results for input(s): PROBNP in the last 8760 hours. HbA1C: No results for input(s): HGBA1C in the last 72 hours. CBG: Recent Labs  Lab 07/14/20 1631 07/14/20 2021 07/15/20 0004 07/15/20 0428 07/15/20 0949  GLUCAP 187* 147* 153* 153* 167*   Lipid Profile: No results for input(s): CHOL, HDL, LDLCALC, TRIG, CHOLHDL, LDLDIRECT in the last 72 hours. Thyroid Function Tests: No results for input(s): TSH, T4TOTAL, FREET4, T3FREE, THYROIDAB in the last 72 hours. Anemia Panel: No results for input(s): VITAMINB12, FOLATE, FERRITIN, TIBC, IRON, RETICCTPCT in the last 72 hours. Sepsis Labs: Recent Labs  Lab 07/14/20 0335 07/15/20 0254  PROCALCITON <0.10 <0.10    Recent Results (from the past 240 hour(s))  Resp Panel by RT-PCR (Flu A&B, Covid) Nasopharyngeal Swab     Status: None   Collection Time: 07/09/2020  3:07 PM   Specimen: Nasopharyngeal Swab; Nasopharyngeal(NP) swabs in vial transport medium  Result Value Ref Range Status   SARS  Coronavirus 2 by RT PCR NEGATIVE NEGATIVE Final    Comment: (NOTE) SARS-CoV-2 target nucleic acids are NOT DETECTED.  The SARS-CoV-2 RNA is generally detectable in upper respiratory specimens during the acute phase of infection. The lowest concentration of SARS-CoV-2 viral copies this assay can detect is 138 copies/mL. A negative result does not preclude SARS-Cov-2 infection and should not be used as the sole basis for treatment or other patient management decisions. A negative result may occur with  improper specimen collection/handling, submission of specimen other than nasopharyngeal swab, presence of viral mutation(s) within the areas targeted by this assay, and inadequate number of viral copies(<138 copies/mL). A negative result must be combined with clinical observations, patient history, and epidemiological information. The expected result is Negative.  Fact Sheet for Patients:  EntrepreneurPulse.com.au  Fact Sheet for Healthcare Providers:  IncredibleEmployment.be  This test is no t yet approved or cleared by the [place] FDA and  has been authorized for detection and/or diagnosis of SARS-CoV-2 by FDA under an Emergency Use Authorization (EUA). This EUA will remain  in effect (meaning this test can be used) for the duration of the COVID-19 declaration under Section 564(b)(1) of the Act, 21 U.S.C.section 360bbb-3(b)(1), unless the authorization is terminated  or revoked sooner.       Influenza A by PCR NEGATIVE NEGATIVE Final  Influenza B by PCR NEGATIVE NEGATIVE Final    Comment: (NOTE) The Xpert Xpress SARS-CoV-2/FLU/RSV plus assay is intended as an aid in the diagnosis of influenza from Nasopharyngeal swab specimens and should not be used as a sole basis for treatment. Nasal washings and aspirates are unacceptable for Xpert Xpress SARS-CoV-2/FLU/RSV testing.  Fact Sheet for  Patients: EntrepreneurPulse.com.au  Fact Sheet for Healthcare Providers: IncredibleEmployment.be  This test is not yet approved or cleared by the [place] FDA and has been authorized for detection and/or diagnosis of SARS-CoV-2 by FDA under an Emergency Use Authorization (EUA). This EUA will remain in effect (meaning this test can be used) for the duration of the COVID-19 declaration under Section 564(b)(1) of the Act, 21 U.S.C. section 360bbb-3(b)(1), unless the authorization is terminated or revoked.  Performed at [facility], [place] [address], [place], [place] [address]   Blood culture (routine single)     Status: None   Collection Time: 07/25/2020  3:55 PM   Specimen: BLOOD RIGHT FOREARM  Result Value Ref Range Status   Specimen Description BLOOD RIGHT FOREARM  Final   Special Requests   Final    BOTTLES DRAWN AEROBIC AND ANAEROBIC Blood Culture results may not be optimal due to an excessive volume of blood received in culture bottles   Culture   Final    NO GROWTH 5 DAYS Performed at [facility], [place] [address] [place], [place] [address]    Report Status 07/11/2020 FINAL  Final  Culture, blood (single)     Status: None   Collection Time: 07/14/2020  3:59 PM   Specimen: BLOOD  Result Value Ref Range Status   Specimen Description BLOOD LEFT ANTECUBITAL  Final   Special Requests   Final    BOTTLES DRAWN AEROBIC AND ANAEROBIC Blood Culture adequate volume   Culture   Final    NO GROWTH 5 DAYS Performed at [facility], [place] [address], [place], [place] [address]    Report Status 07/11/2020 FINAL  Final  Urine culture     Status: Abnormal   Collection Time: 07/24/2020  4:23 PM   Specimen: In/Out Cath Urine  Result Value Ref Range Status   Specimen Description IN/OUT CATH URINE  Final   Special Requests NONE  Final   Culture (A)  Final    >=100,000 COLONIES/mL VANCOMYCIN RESISTANT ENTEROCOCCUS >=100,000 COLONIES/mL  GRANULICATELLA ADIACENS Standardized susceptibility testing for this organism is not available. Performed at [facility], [place] [address], [place], [place] [address]    Report Status 07/10/2020 FINAL  Final   Organism ID, Bacteria VANCOMYCIN RESISTANT ENTEROCOCCUS (A)  Final      Susceptibility   Vancomycin resistant enterococcus - MIC*    AMPICILLIN >=32 RESISTANT Resistant     NITROFURANTOIN 32 SENSITIVE Sensitive     VANCOMYCIN >=32 RESISTANT Resistant     LINEZOLID 2 SENSITIVE Sensitive     * >=100,000 COLONIES/mL VANCOMYCIN RESISTANT ENTEROCOCCUS  MRSA PCR Screening     Status: Abnormal   Collection Time: 07/09/20  4:16 AM   Specimen: Nasal Mucosa; Nasopharyngeal  Result Value Ref Range Status   MRSA by PCR POSITIVE (A) NEGATIVE Final    Comment:        The GeneXpert MRSA Assay (FDA approved for NASAL specimens only), is one component of a comprehensive MRSA colonization surveillance program. It is not intended to diagnose MRSA infection nor to guide or monitor treatment for MRSA infections. RESULT CALLED TO,  READ BACK BY AND VERIFIED WITH: A CAIN RN 07/09/20 1281 JDW Performed at [facility], [place] [address] [place],  [address]      Radiology Studies: DG Chest Port 1 View  Result Date: 07/14/2020 CLINICAL DATA:  Fluid overload. EXAM: PORTABLE CHEST 1 VIEW COMPARISON:  July 11, 2020. FINDINGS: Stable cardiomediastinal silhouette. Feeding tube is seen entering stomach. No pneumothorax is noted. Mild bibasilar atelectasis is noted with probable small pleural effusions. Bony thorax is unremarkable. IMPRESSION: Mild bibasilar subsegmental atelectasis with probable small pleural effusions. Electronically Signed   By: Marijo Conception M.D.   On: 07/14/2020 10:24     LOS: 9 days   Antonieta Pert, MD [facility]  07/15/2020, 10:51 AM

## 2020-07-15 NOTE — Progress Notes (Signed)
Physical Therapy Treatment Patient Details Name: Hannah [name] MRN: [id number] DOB: 1932-03-27 Today's Date: 07/15/2020    History of Present Illness Hannah [name] is a 87 y.o. female with medical history significant of DM2, COPD on 3L at baseline, GERD, HLD, HTN, dementia presenting to ED with 5d hx of SOB, cought, and fever. Recent PNA dx, started on antiobiotics.Evening of 1/10, she had desaturation to the point of near arrest.  Code blue called and pt intubated by anesthesia before being transferred to [facility] ICU. Extubated 1/11.    PT Comments    Session focused on therapeutic exercises and bed mobility. Pt requiring up to moderate assist for bed mobility and unable to stand. SpO2 93-98% on 5L O2. Demonstrates poor activity tolerance, decreased sitting balance, generalized weakness. [name] continue to follow acutely to progress as tolerated.    Follow Up Recommendations  SNF;Supervision/Assistance - 24 hour     Equipment Recommendations  Wheelchair (measurements PT);Wheelchair cushion (measurements PT);Hospital bed; (hoyer lift)    Recommendations for Other Services       Precautions / Restrictions Precautions Precautions: Fall;Other (comment) Precaution Comments: NGT Restrictions Weight Bearing Restrictions: No    Mobility  Bed Mobility Overal bed mobility: Needs Assistance Bed Mobility: Rolling;Supine to Sit;Sit to Supine Rolling: Min assist   Supine to sit: Mod assist Sit to supine: Mod assist   General bed mobility comments: Cues for bring BLE's to edge of bed, assist for trunk to upright. Assist for BLE's back into bed and to scoot up. Rolling to L/R with minA  Transfers                 General transfer comment: unable  Ambulation/Gait                 Stairs             Wheelchair Mobility    Modified Rankin (Stroke Patients Only)       Balance Overall balance assessment: Needs assistance Sitting-balance support: Single extremity  supported;Feet unsupported Sitting balance-Leahy Scale: Fair                                      Cognition Arousal/Alertness: Awake/alert Behavior During Therapy: WFL for tasks assessed/performed Overall Cognitive Status: History of cognitive impairments - at baseline                                 General Comments: follows 1 step commands with increased time/repetition. Inconsistent yes/no responses      Exercises General Exercises - Upper Extremity Shoulder Flexion: AAROM;Both;5 reps;Seated General Exercises - Lower Extremity Ankle Circles/Pumps: Both;10 reps;Seated Long Arc Quad: AAROM;Both;10 reps;Seated    General Comments        Pertinent Vitals/Pain Pain Assessment: Faces Faces Pain Scale: Hurts a little bit Pain Location: generalized discomfort with movement Pain Descriptors / Indicators: Discomfort Pain Intervention(s): Monitored during session    Home Living                      Prior Function            PT Goals (current goals can now be found in the care plan section) Acute Rehab PT Goals Patient Stated Goal: didn't state Potential to Achieve Goals: Fair    Frequency    Min 2X/week  PT Plan Current plan remains appropriate    Co-evaluation              AM-PAC PT "6 Clicks" Mobility   Outcome Measure  Help needed turning from your back to your side while in a flat bed without using bedrails?: A Little Help needed moving from lying on your back to sitting on the side of a flat bed without using bedrails?: A Lot Help needed moving to and from a bed to a chair (including a wheelchair)?: Total Help needed standing up from a chair using your arms (e.g., wheelchair or bedside chair)?: Total Help needed to walk in hospital room?: Total Help needed climbing 3-5 steps with a railing? : Total 6 Click Score: 9    End of Session Equipment Utilized During Treatment: Oxygen Activity Tolerance: Patient  limited by fatigue Patient left: in bed;with call bell/phone within reach;with bed alarm set Nurse Communication: Mobility status PT Visit Diagnosis: Unsteadiness on feet (R26.81);Muscle weakness (generalized) (M62.81);Difficulty in walking, not elsewhere classified (R26.2)     Time: 0093-8182 PT Time Calculation (min) (ACUTE ONLY): 25 min  Charges:  $Therapeutic Activity: 23-37 mins                     Wyona Almas, PT, DPT Acute Rehabilitation Services Pager [phone] Office [phone]    Deno Etienne 07/15/2020, 5:07 PM

## 2020-07-15 NOTE — Progress Notes (Signed)
Nutrition Follow-up  DOCUMENTATION CODES:   Not applicable  INTERVENTION:   Continue Osmolite 1.2 @ 55 ml/hr via cortrak tube  45 ml Prosource TF daily    110 ml free water flush every 4 hours  Tube feeding regimen provides 1624 kcal (100% of needs), 84 grams of protein, and 1069 ml of H2O. Total free water: 1509 ml daily  NUTRITION DIAGNOSIS:   Inadequate oral intake related to acute illness as evidenced by NPO status.  Ongoing  GOAL:   Patient will meet greater than or equal to 90% of their needs  Met with TF  MONITOR:   TF tolerance,Diet advancement,Labs,Weight trends  REASON FOR ASSESSMENT:   Rounds    ASSESSMENT:   87 yo female admitted with acute on chronic respiratory failure with encephalopathy (Alzheimer's dementia at baseline), AKI on CKD, dysphagia with esophageal stricture. PMH HTN, GERD, HLD, DM  1/08 Admitted 1/10 Intubated 1/11 Extubated 1/14 Failed MBS, cortrak tube placed, TF initiated  Reviewed I/O's: +373 ml x 24 hours and +1.3 L since admission  UOP: 500 ml x 24 hours  Pt remains NPO. Per chart review, she is tolerating TF well. Osmolite 1.2 is infusing at goal rate at 55 ml/hr, which provides 1584 kcals, 73 grams protein, and 1069 ml free water, which provides 100% of estimated kcal and 97% of estimated protein needs.  Medications reviewed and include cardizem, colace, lasix, and miralax.  Labs reviewed: CBGS: 147-187 (inpatient orders for glycemic control are 0-9 units insulin aspart every 4 hours).   Diet Order:   Diet Order            Diet NPO time specified Except for: Sips with Meds  Diet effective midnight                 EDUCATION NEEDS:   Not appropriate for education at this time  Skin:  Skin Assessment: Reviewed RN Assessment  Last BM:  07/14/20  Height:   Ht Readings from Last 1 Encounters:  07/13/20 _0  (1.626 m)    Weight:   Wt Readings from Last 1 Encounters:  07/15/20 59.5 kg   BMI:  Body mass  index is 22.52 kg/m.  Estimated Nutritional Needs:   Kcal:  1500-1700 kcals  Protein:  75-85 g  Fluid:  >/= 1.5 L    Loistine Chance, RD, LDN, [place] Registered Dietitian II Certified Diabetes Care and Education Specialist Please refer to [facility] for RD and/or RD on-call/weekend/after hours pager

## 2020-07-15 NOTE — Progress Notes (Signed)
Pt desat to 80% while on 4L [place]. Crackles upon auscultation. Pt tachypeneic with labored breathing. NRB placed on pt. O2 up to 100% after Nebulizer and NRB. MD paged.

## 2020-07-16 ENCOUNTER — Inpatient Hospital Stay (HOSPITAL_COMMUNITY)

## 2020-07-16 DIAGNOSIS — J9622 Acute and chronic respiratory failure with hypercapnia: Secondary | ICD-10-CM | POA: Diagnosis not present

## 2020-07-16 DIAGNOSIS — I4891 Unspecified atrial fibrillation: Secondary | ICD-10-CM | POA: Diagnosis not present

## 2020-07-16 DIAGNOSIS — J9621 Acute and chronic respiratory failure with hypoxia: Secondary | ICD-10-CM | POA: Diagnosis not present

## 2020-07-16 LAB — CBC
HCT: 36.9 % (ref 36.0–46.0)
Hemoglobin: 10.6 g/dL — ABNORMAL LOW (ref 12.0–15.0)
MCH: 26.3 pg (ref 26.0–34.0)
MCHC: 28.7 g/dL — ABNORMAL LOW (ref 30.0–36.0)
MCV: 91.6 fL (ref 80.0–100.0)
Platelets: 231 10*3/uL (ref 150–400)
RBC: 4.03 MIL/uL (ref 3.87–5.11)
RDW: 17 % — ABNORMAL HIGH (ref 11.5–15.5)
WBC: 19.1 10*3/uL — ABNORMAL HIGH (ref 4.0–10.5)
nRBC: 0 % (ref 0.0–0.2)

## 2020-07-16 LAB — GLUCOSE, CAPILLARY
Glucose-Capillary: 218 mg/dL — ABNORMAL HIGH (ref 70–99)
Glucose-Capillary: 228 mg/dL — ABNORMAL HIGH (ref 70–99)
Glucose-Capillary: >600 mg/dL (ref 70–99)
Glucose-Capillary: 205 mg/dL — ABNORMAL HIGH (ref 70–99)
Glucose-Capillary: 174 mg/dL — ABNORMAL HIGH (ref 70–99)
Glucose-Capillary: 193 mg/dL — ABNORMAL HIGH (ref 70–99)
Glucose-Capillary: 182 mg/dL — ABNORMAL HIGH (ref 70–99)
Glucose-Capillary: 168 mg/dL — ABNORMAL HIGH (ref 70–99)

## 2020-07-16 LAB — BASIC METABOLIC PANEL
Anion gap: 13 (ref 5–15)
BUN: 26 mg/dL — ABNORMAL HIGH (ref 8–23)
CO2: 31 mmol/L (ref 22–32)
Calcium: 8.4 mg/dL — ABNORMAL LOW (ref 8.9–10.3)
Chloride: 89 mmol/L — ABNORMAL LOW (ref 98–111)
Creatinine, Ser: 0.89 mg/dL (ref 0.44–1.00)
GFR, Estimated: >60 mL/min (ref >60)
Glucose, Bld: 266 mg/dL — ABNORMAL HIGH (ref 70–99)
Potassium: 4.2 mmol/L (ref 3.5–5.1)
Sodium: 133 mmol/L — ABNORMAL LOW (ref 135–145)

## 2020-07-16 LAB — MAGNESIUM: Magnesium: 1.8 mg/dL (ref 1.7–2.4)

## 2020-07-16 LAB — PROCALCITONIN: Procalcitonin: <0.1 ng/mL

## 2020-07-16 MED ORDER — FUROSEMIDE 10 MG/ML IJ SOLN
40.0000 mg | Freq: Once | INTRAMUSCULAR | Status: AC
  Administered 2020-07-16: 40 mg via INTRAVENOUS
  Filled 2020-07-16: qty 4

## 2020-07-16 MED ORDER — INSULIN ASPART 100 UNIT/ML ~~LOC~~ SOLN
4.0000 [IU] | Freq: Four times a day (QID) | SUBCUTANEOUS | Status: DC
  Administered 2020-07-16 – 2020-07-17 (×5): 4 [IU] via SUBCUTANEOUS

## 2020-07-16 MED ORDER — K PHOS MONO-SOD PHOS DI & MONO 155-852-130 MG PO TABS
250.0000 mg | ORAL_TABLET | Freq: Three times a day (TID) | ORAL | Status: AC
  Administered 2020-07-16 – 2020-07-18 (×6): 250 mg via NASOGASTRIC
  Filled 2020-07-16 (×6): qty 1

## 2020-07-16 NOTE — Progress Notes (Signed)
Occupational Therapy Treatment Patient Details Name: Hannah [name] MRN: [id number] DOB: 12-23-1931 Today's Date: 07/16/2020    History of present illness Hannah [name] is a 87 y.o. female with medical history significant of DM2, COPD on 3L at baseline, GERD, HLD, HTN, dementia presenting to ED with 5d hx of SOB, cought, and fever. Recent PNA dx, started on antiobiotics.Evening of 1/10, she had desaturation to the point of near arrest.  Code blue called and pt intubated by anesthesia before being transferred to [facility] ICU. Extubated 1/11.   OT comments  Pt making steady progress towards OT goals this session. Pt continues to present with decreased activity tolerance, increased WOB and generalized deconditioning impacting pts ability to complete BADLs independently. Pt on 4L [place] during session with sats >98% throughout session, however increased WOB with RR up to 33 with minimal activity of sitting EOB for light therex. Pt reports dizziness when sitting EOB BP 157/64. Pt required MOD A +1 to transition from supine<>EOB, min guard for static sitting balance ~ 10 mins. Updated dc recs as pt will require 24 hour assist d/t current level of assist needed, unclear on PLOF from chart review but feel pt would benefit from SNF placement to increase functional endurance and decrease caregiver burden. Will let OTR know about change in POC, will continue to follow acutely per POC.    Follow Up Recommendations  SNF;Supervision/Assistance - 24 hour    Equipment Recommendations  3 in 1 bedside commode;Tub/shower seat;Wheelchair (measurements OT);Wheelchair cushion (measurements OT);Hospital bed    Recommendations for Other Services      Precautions / Restrictions Precautions Precautions: Fall;Other (comment) Precaution Comments: NGT, watch sats Restrictions Weight Bearing Restrictions: No       Mobility Bed Mobility Overal bed mobility: Needs Assistance Bed Mobility: Supine to Sit;Sit to Supine      Supine to sit: Mod assist Sit to supine: Mod assist   General bed mobility comments: pt able to advance BLEs to EOB with cues, MOD A to elevate trunk and use of bed pad to scoot hips to EOB. MOD A to return BLEs back to bed with pt able to lower trunk back to elevated HOB  Transfers                 General transfer comment: unable    Balance Overall balance assessment: Needs assistance Sitting-balance support: Feet supported;Single extremity supported;Bilateral upper extremity supported Sitting balance-Leahy Scale: Poor Sitting balance - Comments: at least one UE supported while sitting EOB ~ 10 mins                                   ADL either performed or assessed with clinical judgement   ADL Overall ADL's : Needs assistance/impaired                                     Functional mobility during ADLs: Moderate assistance (bed mobility only) General ADL Comments: pt continues to present with decreased activity tolerance, increased WOB and generalized deconditioning     Vision       Perception     Praxis      Cognition Arousal/Alertness: Awake/alert Behavior During Therapy: WFL for tasks assessed/performed Overall Cognitive Status: History of cognitive impairments - at baseline  General Comments: pt mostly nonverbal during session but able to state name and respond to yes and no questions. pt able to problem correct month with cues        Exercises Other Exercises Other Exercises: LAQ from EOB BLEs x10 reps Other Exercises: seated marches x10 reps from EOB   Shoulder Instructions       General Comments pt on 4L [place] during session with sats >98% throughout session, however increased WOB with RR up to 33. Pt reports dizziness when sitting EOB BP 157/64    Pertinent Vitals/ Pain       Pain Assessment: Faces Faces Pain Scale: Hurts little more Pain Location: generalized discomfort  with movement Pain Descriptors / Indicators: Discomfort Pain Intervention(s): Monitored during session  Home Living                                          Prior Functioning/Environment              Frequency  Min 2X/week        Progress Toward Goals  OT Goals(current goals can now be found in the care plan section)  Progress towards OT goals: Progressing toward goals  Acute Rehab OT Goals Patient Stated Goal: didn't state OT Goal Formulation: Patient unable to participate in goal setting Time For Goal Achievement: 07/26/20 Potential to Achieve Goals: [place] Discharge plan remains appropriate;Frequency remains appropriate    Co-evaluation                 AM-PAC OT "6 Clicks" Daily Activity     Outcome Measure   Help from another person eating meals?: A Lot Help from another person taking care of personal grooming?: A Little Help from another person toileting, which includes using toliet, bedpan, or urinal?: A Lot Help from another person bathing (including washing, rinsing, drying)?: A Lot Help from another person to put on and taking off regular upper body clothing?: A Lot Help from another person to put on and taking off regular lower body clothing?: Total 6 Click Score: 12    End of Session Equipment Utilized During Treatment: Oxygen;Other (comment) (4L )  OT Visit Diagnosis: Unsteadiness on feet (R26.81);Muscle weakness (generalized) (M62.81)   Activity Tolerance Patient tolerated treatment well   Patient Left in bed;with call bell/phone within reach;with bed alarm set   Nurse Communication Mobility status        Time: 5027-7412 OT Time Calculation (min): 24 min  Charges: OT General Charges $OT Visit: 1 Visit OT Treatments $Therapeutic Activity: 23-37 mins  Hannah [name]., COTA/L Acute Rehabilitation Services [phone]    Hannah [name] 07/16/2020, 9:55 AM

## 2020-07-16 NOTE — Progress Notes (Addendum)
HOSPITAL MEDICINE OVERNIGHT EVENT NOTE    Notified by nursing that patient has exhibited bouts of dyspnea and hypoxia requiring transient transition to a nonrebreather mask in the past several hours.  Nursing reports that lung exam reveals diffuse coarse rales.  Chart reviewed, patient has been admitted for sepsis secondary to VRE UTI complicated by rapid atrial fibrillation, acute kidney injury and encephalopathy.  Last echocardiogram reveals a preserved ejection fraction.  Nursing reports that patient has been experiencing occasional bouts of rapid atrial fibrillation.  Will obtain stat chest x-ray and evaluate for any evidence of developing pulmonary edema or focal infiltrate.  Hannah Emerald  MD [facility]   ADDENDUM 1:35am 1/18  Chest x-ray does seem to reveal some mild advancement of bilateral patchy infiltrates concerning for pulmonary edema.  Persisting bilateral pleural effusions.  Considering patient's ongoing shortness of breath we will give 1 additional dose of 40 mg of IV Lasix now.  We will also follow-up on labs that will be drawn later this morning to ensure electrolytes are at target.  Hannah [name]

## 2020-07-16 NOTE — Progress Notes (Signed)
Inpatient Diabetes Program Recommendations  AACE/ADA: New Consensus Statement on Inpatient Glycemic Control   Target Ranges:  Prepandial:   less than 140 mg/dL      Peak postprandial:   less than 180 mg/dL (1-2 hours)      Critically ill patients:  140 - 180 mg/dL   Results for Hannah [name], Hannah [name] (MRN [id number]) as of 07/16/2020 10:52  Ref. Range 07/16/2020 02:20 07/16/2020 04:16 07/16/2020 04:18 07/16/2020 04:20 07/16/2020 08:37  Glucose-Capillary Latest Ref Range: 70 - 99 mg/dL 218 (H) >600 (HH) >600 (HH) 228 (H) 205 (H)   Review of Glycemic Control  Diabetes history: DM2 Outpatient Diabetes medications: Metformin 500 mg BID Current orders for Inpatient glycemic control: Novolog 0-9 units Q4H; Osmolite@55  ml/hr  Inpatient Diabetes Program Recommendations:    Insulin-Tube Feeding: Please consider ordering Novolog 4 units Q4H for tube feeding coverage. If tube feeding is stopped or held then Novolog tube feeding coverage should also be stopped or held.  NOTE: Noted finger stick of >600 mg/dl at 4:16, 4:18 and then 228 mg/dl at 4:20 without any intervention. Anticipate values over 600 mg/dl inaccurate. Would recommend adding tube feeding coverage since CBGs are consistently in 200's.   Thanks, Barnie Alderman, RN, MSN, CDE Diabetes Coordinator Inpatient Diabetes Program [phone] (Team Pager from 8am to 5pm)

## 2020-07-16 NOTE — Consult Note (Signed)
   [facility] CM Inpatient Consult   07/16/2020  NAAVYA POSTMA Jul 01, 1931 [id number]   [place] [facility] [ACO] Patient:  Holland Falling Medicare  Patient was a recent transfer from ICU on 07/13/2020 to Progressive unit. Patient screened for extreme high risk score for unplanned readmission score and for hospitalizations to check if potential [place] Management service needs.  Review of patient's medical record reveals patient is patient is being recommended for SNF however, family is planning for return home with hopes of personal care services noted per inpatient [facility] RNCM notes.  Primary Care Provider is Street, [name], MD [facility] this provider is listed to provide the transition of care [TOC] for post hospital follow up.  Plan:  Continue to follow progress and disposition to assess for post hospital care management needs.    Please place a [facility] Care Management consult as appropriate and for questions contact:   Natividad Brood, RN BSN [place] Hospital Liaison  [phone] business mobile phone Toll free office [phone]  Fax number: [phone] [email or web address]

## 2020-07-16 NOTE — Progress Notes (Signed)
Progress Note  Patient Name: Hannah [name] Date of Encounter: 07/16/2020  [facility] Cardiologist: Mertie Moores, MD   Subjective   Overnight has has increased hypoxia with amiodarone and been getting IV lasix. Rate control has improved on IV amiodarone.  Off Mitts today  Inpatient Medications    Scheduled Meds:  apixaban  2.5 mg Per Tube BID   chlorhexidine gluconate (MEDLINE KIT)  15 mL Mouth Rinse BID   Chlorhexidine Gluconate Cloth  6 each Topical Daily   diltiazem  60 mg Per Tube Q6H   docusate  100 mg Per Tube BID   feeding supplement (PROSource TF)  45 mL Per Tube Daily   free water  110 mL Per Tube Q6H   furosemide  20 mg Intravenous Once   furosemide  40 mg Per Tube Daily   insulin aspart  0-9 Units Subcutaneous Q4H   levothyroxine  25 mcg Per Tube QAC breakfast   polyethylene glycol  17 g Per Tube Daily   Resource ThickenUp Clear   Oral Once   Continuous Infusions:  sodium chloride Stopped (07/16/20 0201)   amiodarone 30 mg/hr (07/16/20 0757)   feeding supplement (OSMOLITE 1.2 CAL) 1,000 mL (07/16/20 0630)   linezolid (ZYVOX) IV Stopped (07/16/20 0032)   PRN Meds: sodium chloride, acetaminophen **OR** acetaminophen, albuterol, haloperidol lactate, hydrALAZINE, labetalol, Racepinephrine HCl   Vital Signs    Vitals:   07/16/20 0500 07/16/20 0600 07/16/20 0700 07/16/20 0800  BP:    (!) 143/39  Pulse: 64 61 (!) 103 74  Resp: (!) 30 (!) 29 (!) 31 (!) 23  Temp:    97.9 F (36.6 C)  TempSrc:    Axillary  SpO2: 100% 100% 100% 100%  Weight:      Height:        Intake/Output Summary (Last 24 hours) at 07/16/2020 1022 Last data filed at 07/16/2020 0757 Gross per 24 hour  Intake 872.81 ml  Output 1050 ml  Net -177.19 ml   Last 3 Weights 07/15/2020 07/14/2020 07/13/2020  Weight (lbs) 131 lb 2.8 oz 130 lb 11.7 oz 130 lb 8.2 oz  Weight (kg) 59.5 kg 59.3 kg 59.2 kg      Telemetry    Atrial fibrillation with improve rate control - Personally  Reviewed  ECG    No new tracings - Personally Reviewed  Physical Exam   GEN: frail elderly female appearing mildly short of breath.   Neck: No JVD Cardiac: IRIR, +murmur, no rubs or gallops.  Respiratory: coarse upper airway sounds anteriorly with diminished breath sounds posteriorly GI: Soft, nontender, non-distended  MS: No edema; No deformity. Neuro:  Nonfocal  Psych: demented, calm, in mittens  Labs    High Sensitivity Troponin:   Recent Labs  Lab 07/08/20 1828 07/08/20 2028  TROPONINIHS 61* 59*      Chemistry Recent Labs  Lab 07/13/20 0111 07/14/20 0335 07/16/20 0657  NA 137 137 133*  K 3.9 4.0 4.2  CL 98 98 89*  CO2 _0 GLUCOSE 254* 176* 266*  BUN 42* 32* 26*  CREATININE 1.41* 1.09* 0.89  CALCIUM 9.0 8.6* 8.4*  GFRNONAA 36* 49* >60  ANIONGAP _1 Hematology Recent Labs  Lab 07/14/20 0335 07/15/20 0254 07/16/20 0418  WBC 24.0* 18.0* 19.1*  RBC 4.50 4.28 4.03  HGB 11.9* 11.3* 10.6*  HCT 40.3 38.8 36.9  MCV 89.6 90.7 91.6  MCH 26.4 26.4 26.3  MCHC 29.5* 29.1* 28.7*  RDW  17.2* 17.2* 17.0*  PLT 348 279 231    BNPNo results for input(s): BNP, PROBNP in the last 168 hours.   DDimer No results for input(s): DDIMER in the last 168 hours.   Radiology    DG Chest 1 View  Result Date: 07/16/2020 CLINICAL DATA:  Respiratory distress EXAM: CHEST  1 VIEW COMPARISON:  Radiograph 07/14/2020 FINDINGS: Redemonstration of the diffuse interstitial opacity throughout both lungs with some increasing areas of more consolidative opacity in the retrocardiac space, right base and bilateral mid lungs. Pulmonary vascular congestion is increased from prior as well. Likely slight interval increase in the size of bilateral pleural effusions as well. Enlarged cardiac silhouette is similar to comparison priors. The aorta is calcified. The remaining cardiomediastinal contours are unremarkable. Transesophageal tube tip terminates below the margins of imaging,  beyond the GE junction. Telemetry leads and external support devices overlie the chest. No other acute osseous or soft tissue abnormality. Remote posttraumatic deformity distal right clavicle. Severe degenerative changes in the bilateral shoulders. Multilevel degenerative changes are present in the imaged portions of the spine. Midthoracic dextrocurvature is unchanged. IMPRESSION: 1. Persistent interstitial and developing airspace opacities and increasing pulmonary vascular congestion may reflect worsening edema, infection or ARDS. 2. Likely slight interval increase in size of bilateral pleural effusions as well. 3.  Aortic Atherosclerosis (ICD10-I70.0). Electronically Signed   By: Lovena Le M.D.   On: 07/16/2020 00:57    Cardiac Studies   Echocardiogram 07/08/20: 1. Left ventricular ejection fraction, by estimation, is 50 to 55%. The  left ventricle has low normal function. The left ventricle demonstrates  global hypokinesis. There is moderate concentric left ventricular  hypertrophy. Left ventricular diastolic  function could not be evaluated.   2. Right ventricular systolic function is mildly reduced. The right  ventricular size is not well visualized. There is moderately elevated  pulmonary artery systolic pressure. The estimated right ventricular  systolic pressure is 54.6 mmHg.   3. Left atrial size was mild to moderately dilated.   4. The mitral valve is degenerative. Moderate mitral valve regurgitation.  No evidence of mitral stenosis. Moderate mitral annular calcification.   5. The aortic valve is tricuspid. There is severe calcifcation of the  aortic valve. There is moderate thickening of the aortic valve. The non  coronary cusp is immobile. Aortic valve regurgitation is mild. Mild to  moderate aortic valve stenosis. Aortic  valve area, by VTI measures 1.23 cm. Aortic valve mean gradient measures  9.8 mmHg. Aortic valve Vmax measures 1.98 m/s. The DI is 0.54. The AVA is   underestimated due to small LVOT measurement but likely more than mild AS  as the SVI is low at 26.  Findings consistent with low flow low gradient Aortic stenosis.   6. The inferior vena cava is dilated in size with <50% respiratory  variability, suggesting right atrial pressure of 15 mmHg.   7. Trivial pericardial effusion anterior to the right ventricle.   Patient Profile     87 y.o. female with PMH of HTN, HLD, DM type 2, COPD on home O2 3L, and dementia who is being followed by cardiology for atrial fibrillation with RVR  Assessment & Plan    1. Atrial fibrillation with RVR: rates remain poorly controlled. Suspect this is driven by underlying UTI. She is on diltiazem 74m q6hr. BBlocker avoided due to COPD history.  - Will continue amiodarone gtt for rate/rhythm control; when rates are persistently controlled would transition to 400 mg PO BID for  one week - Continue diltiazem for rate control - Can continue eliquis 2.35m BID (adjusted for age and weight), though ultimately is a poor candidate at home due to fall risk.   2. Sepsis due to VRE UTI: currently on linezolid.  - Continue management per primary team.  3. Acute on chronic respiratory failure in patient with COPD on home O2: patient initially required BiPAP on arrival, briefly intubated this admission, and now continues to require intermittent BiPAP. She remains supportive care with nebulizers; agree with 40 mg IV Lasix daily (and may need additional PRN dosing) while on IV amiodarone - Continue management per primary team - Able to wean patient down to 3 L during exam without hypoxia  4. Dysphagia: barium swall with narrowing of distal esophagus. Seen by GI and high risk for EGD. Conservative care recommended. She has a nasal feeding tube in place - Continue management per primary team  5. HTN: BP persistently elevated.  - prn hydralazine and continue to monitor   Remainder of care per primary team: - Hypothyroidism -  DM type 2 - Encephalopathy     For questions or updates, please contact CV[place]orgPlease consult [email or web address] for contact info under        Signed, MWerner Lean MD  07/16/2020, 10:22 AM

## 2020-07-16 NOTE — Progress Notes (Signed)
PROGRESS NOTE    Hannah [name]  LOV:[id number] DOB: 1931/11/12 DOA: 07/15/2020 PCP: Street, Stephanie Coup, MD   Chief Complaint  Patient presents with  . Shortness of Breath   Brief Narrative: 87 y.o. female with medical history significant of DM2, COPD on 3L at baseline, GERD, HLD, HTN, dementia, wheelchair-bound who was enrolled in hospice of [facility] prior to admission who was recently diagnosed with pneumonia and was started on antibiotics on day of admission brought to the ED  07/26/2020 with hypoxia 79% RA, and was admitted for septic shock due to CAP, new onset of A.fib, and had desaturation event to the point of near arrest on 1/30 CODE BLUE called intubated and transferred to ICU. 1/9 GI consult for dysphagia > barium esophogram ordered before deciding on next steps (? EGD, dilation for stricture etc) GI has signed off since then due to poor respiratory status and other complex comorbidities, PT OT speech has been working. 1/1o- patient was intubated and extubated on 1/11. 1/13- tx to [facility], but was in resp distress needing bipap, persistent A. fib placed on Cardizem drip, seen by cardiology, 1/14- started diuresis, cortark tube-with significant diuresis and patient overall clinically improved.Transferred to progressive bed 1/15. Patient has been having A. fib with RVR and resp distress on/off.  Subjective:  Events noted patient has significant hyperglycemia?,  Also respiratory distress, chest x-ray showed persistent interstitial and developing airspace opacities and increasing pulmonary vascular congestion  May reflect worsening edema infection or ARDS. Patient received Lasix 40 mg iv x 1 On 5l [place]- weaned down to 3l by cards  WBC count WBC 18K>19K Patient able to wake up,follows commands,trying to whisper to answer.  Assessment & Plan:  Acute on chronic hypoxic respiratory failure/COPD with chronic hypoxia at home on 3l [place]: 2/2 advanced COPD,chronic aspiration syndrome and volume  overload:Extubated on 1/11 needed BiPAP 1/13. Had good diuresis with w/ iv lasix 07/12/20-changed to p.o. Lasix.Overnight respiratory distress chest x-ray interstitial and developing airspace opacities- s/o lasix 40 miv x1,on PO lasix. Procalcitonin negative, repeat procal stable. Continue diuresis w/ Lasix ( reassess for iv), cont bronchodilators antibiotics.Seen by pulmonary and signed off.Net IO Since Admission: 246.94 mL [07/16/20 0744]  Intake/Output Summary (Last 24 hours) at 07/16/2020 0744 Last data filed at 07/15/2020 2000 Gross per 24 hour  Intake --  Output 1050 ml  Net -1050 ml   Acute metabolic encephalopathy:likely multifactorial with failure to thrive,delirium,hospital-acquired in the setting of dementia.Cont supportive care.   New onset A. fib with RVR/history of systolic CHF with EF 50 to 55%/hyperlipidemia:Rate uncontrolled-cardiology back in service 1/17,loaded with IV amiodarone ,cardizem increased from 40 mg to 60 mg q6h,eliquis. Pt is a poor candidate for long-term anticoagulation and will likely not continue on discharge unless cardiology recommends due to fall risk/dementia.    Sepsis due to VRE UTI on Zyvox x7 days starting 1/12. WBC count increasing-repeated procalcitonin level is normal. ?ongoing aspiration, is npo on ngt feeding. Monitor wbc. Recent Labs  Lab 07/12/20 0140 07/13/20 0111 07/14/20 0335 07/15/20 0254 07/16/20 0418  WBC 13.1* 11.3* 24.0* 18.0* 19.1*  PROCALCITON  --   --  <0.10 <0.10  --    Dysphagia with narrowing of distal esophagus just above GE junction in barium swallow, history of esophageal strictures needing dilation in the past.Seen by GI and signed off.  s/p MBS today. On core track tube feeds since 1/14.  AKI on CKD?? baseline/with metabolic acidosis: Creatinine peaked at 2.2, creatinine improved to 1.0.  Repeat labs.  Monitor  Recent Labs  Lab 07/10/20 0037 07/11/20 0556 07/12/20 0140 07/13/20 0111 07/14/20 0335  BUN 68* 63* 57* 42*  32*  CREATININE 1.83* 1.61* 1.47* 1.41* 1.09*   Hyperkalemia: k stable Hypophosphatemia:add phosphate replacement.  History of diabetes mellitus hold home metformin,  A1c stable 5.3.  Sugar was over 600 for unclear reason likely artifact as repeat CBG was 228, still poorly controlled until fluids so wil aldd novolog 4 units q6h scheduled and cont ssi. Recent Labs  Lab 07/15/20 2014 07/16/20 0220 07/16/20 0416 07/16/20 0418 07/16/20 0420  GLUCAP 176* 218* >600* >600* 228*   Hypothyroidism- cont Synthroid  Lower extremity swelling 2+ negative for DVT  GOC: Patient is a hospice candidate but family wants to proceed with full scope of treatment including intubation but DNR, short-term to feed.Palliative care including intubation but DNR.  Pulmonary following closely.   Nutrition: Diet Order            Diet NPO time specified Except for: Ice Chips  Diet effective midnight                 Body mass index is 22.52 kg/m.  DVT prophylaxis: heparin Code Status:   Code Status: Partial Code  Family Communication: plan of care discussed with patient/S DAUGTER 1/13. called son 1/15- no answer. Previously called Son Rex and updated.  Status is: Inpatient Remains inpatient appropriate because:IV treatments appropriate due to intensity of illness or inability to take PO and Inpatient level of care appropriate due to severity of illness, on NGT feeding, ongoing resp failure  Dispo: The patient is from: hospice              Anticipated d/c is to: snf              Anticipated d/c date is:2 DAYS              Patient currently is not medically stable to d/c. Consultants:see note  Procedures:see note  Culture/Microbiology    Component Value Date/Time   SDES IN/OUT CATH URINE 07/04/2020 1623   SPECREQUEST NONE 07/14/2020 1623   CULT (A) 07/07/2020 1623    >=100,000 COLONIES/mL VANCOMYCIN RESISTANT ENTEROCOCCUS >=100,000 COLONIES/mL GRANULICATELLA ADIACENS Standardized susceptibility  testing for this organism is not available. Performed at [facility], [place] [address], [place], [place] [address]    REPTSTATUS 07/10/2020 FINAL 07/17/2020 1623    Other culture-see note  Medications: Scheduled Meds: . apixaban  2.5 mg Per Tube BID  . chlorhexidine gluconate (MEDLINE KIT)  15 mL Mouth Rinse BID  . Chlorhexidine Gluconate Cloth  6 each Topical Daily  . diltiazem  60 mg Per Tube Q6H  . docusate  100 mg Per Tube BID  . feeding supplement (PROSource TF)  45 mL Per Tube Daily  . free water  110 mL Per Tube Q6H  . furosemide  20 mg Intravenous Once  . furosemide  40 mg Per Tube Daily  . insulin aspart  0-9 Units Subcutaneous Q4H  . levothyroxine  25 mcg Per Tube QAC breakfast  . polyethylene glycol  17 g Per Tube Daily  . Resource ThickenUp Clear   Oral Once   Continuous Infusions: . sodium chloride 250 mL (07/15/20 2331)  . amiodarone 30 mg/hr (07/16/20 0218)  . feeding supplement (OSMOLITE 1.2 CAL) 1,000 mL (07/16/20 0630)  . linezolid (ZYVOX) IV 600 mg (07/15/20 2332)    Antimicrobials: Anti-infectives (From admission, onward)   Start     Dose/Rate Route  Frequency Ordered Stop   07/10/20 1015  linezolid (ZYVOX) IVPB 600 mg        600 mg 300 mL/hr over 60 Minutes Intravenous Every 12 hours 07/10/20 0923 07/17/20 0959   07/09/20 0345  vancomycin (VANCOREADY) IVPB 750 mg/150 mL        750 mg 150 mL/hr over 60 Minutes Intravenous  Once 07/09/20 0250 07/09/20 0506   07/07/20 2300  ceFEPIme (MAXIPIME) 2 g in sodium chloride 0.9 % 100 mL IVPB        2 g 200 mL/hr over 30 Minutes Intravenous Every 24 hours 06/29/2020 2331 07/12/20 0633   07/01/2020 2331  vancomycin variable dose per unstable renal function (pharmacist dosing)  Status:  Discontinued         Does not apply See admin instructions 07/09/2020 2331 07/10/20 0923   07/14/2020 2330  vancomycin (VANCOREADY) IVPB 1250 mg/250 mL        1,250 mg 166.7 mL/hr over 90 Minutes Intravenous  Once 07/24/2020 2325  07/07/20 0317   07/17/2020 2330  ceFEPIme (MAXIPIME) 2 g in sodium chloride 0.9 % 100 mL IVPB        2 g 200 mL/hr over 30 Minutes Intravenous  Once 07/17/2020 2325 07/07/20 0024   07/03/2020 1600  cefTRIAXone (ROCEPHIN) 2 g in sodium chloride 0.9 % 100 mL IVPB  Status:  Discontinued        2 g 200 mL/hr over 30 Minutes Intravenous Every 24 hours 07/24/2020 1557 06/30/2020 2325   07/16/2020 1600  azithromycin (ZITHROMAX) 500 mg in sodium chloride 0.9 % 250 mL IVPB  Status:  Discontinued        500 mg 250 mL/hr over 60 Minutes Intravenous Every 24 hours 06/30/2020 1557 07/09/20 0807     Objective: Vitals: Today's Vitals   07/16/20 0200 07/16/20 0201 07/16/20 0205 07/16/20 0216  BP:      Pulse:      Resp: (!) 28     Temp:      TempSrc:      SpO2: (!) 80% (!) 79% 100% 96%  Weight:      Height:      PainSc:        Intake/Output Summary (Last 24 hours) at 07/16/2020 0744 Last data filed at 07/15/2020 2000 Gross per 24 hour  Intake --  Output 1050 ml  Net -1050 ml   Filed Weights   07/13/20 1959 07/14/20 0451 07/15/20 0435  Weight: 59.2 kg 59.3 kg 59.5 kg   Weight change:   Intake/Output from previous day: 01/17 0701 - 01/18 0700 In: -  Out: 1050 [Urine:1050] Intake/Output this shift: No intake/output data recorded.  Examination: General exam:Weak frail able to follow some commands able to squeeze  On [place], weak appearing. HEENT:Oral mucosa moist, Ear/Nose WNL grossly, dentition normal. Respiratory system: bilaterally DIMINISHED, coarse sounds anteriorly,no use of accessory muscle Cardiovascular system: S1 & S2 +, No JVD,. Gastrointestinal system: Abdomen soft, NT,ND, BS+ Nervous System:Alert, awake, moving extremities and grossly nonfocal Extremities: No edema, distal peripheral pulses palpable.  Skin: No rashes,no icterus. MSK: Normal muscle bulk,tone, power  Data Reviewed: I have personally reviewed following labs and imaging studies CBC: Recent Labs  Lab 07/12/20 0140  07/13/20 0111 07/14/20 0335 07/15/20 0254 07/16/20 0418  WBC 13.1* 11.3* 24.0* 18.0* 19.1*  HGB 10.7* 10.9* 11.9* 11.3* 10.6*  HCT 34.9* 36.5 40.3 38.8 36.9  MCV 89.7 88.0 89.6 90.7 91.6  PLT 256 317 348 279 174   Basic Metabolic Panel: Recent Labs  Lab 07/09/20 0920 07/10/20 0037 07/11/20 0556 07/12/20 0140 07/12/20 1431 07/13/20 0111 07/13/20 1625 07/14/20 0335  NA 130* 133* 134* 135  --  137  --  137  K 5.0 5.3* 4.5 4.6  --  3.9  --  4.0  CL 99 101 102 103  --  98  --  98  CO2 20* 20* 19* 18*  --  26  --  27  GLUCOSE 121* 149* 84 160*  --  254*  --  176*  BUN 68* 68* 63* 57*  --  42*  --  32*  CREATININE 1.89* 1.83* 1.61* 1.47*  --  1.41*  --  1.09*  CALCIUM 8.2* 8.4* 8.7* 8.7*  --  9.0  --  8.6*  MG 2.1 2.2  --   --  2.0 1.7 2.2  --   PHOS  --  4.2  --   --  2.4* 1.7* 1.7* 2.0*   GFR: Estimated Creatinine Clearance: 32 mL/min (A) (by C-G formula based on SCr of 1.09 mg/dL (H)). Liver Function Tests: No results for input(s): AST, ALT, ALKPHOS, BILITOT, PROT, ALBUMIN in the last 168 hours. No results for input(s): LIPASE, AMYLASE in the last 168 hours. No results for input(s): AMMONIA in the last 168 hours. Coagulation Profile: No results for input(s): INR, PROTIME in the last 168 hours. Cardiac Enzymes: No results for input(s): CKTOTAL, CKMB, CKMBINDEX, TROPONINI in the last 168 hours. BNP (last 3 results) No results for input(s): PROBNP in the last 8760 hours. HbA1C: No results for input(s): HGBA1C in the last 72 hours. CBG: Recent Labs  Lab 07/15/20 2014 07/16/20 0220 07/16/20 0416 07/16/20 0418 07/16/20 0420  GLUCAP 176* 218* >600* >600* 228*   Lipid Profile: No results for input(s): CHOL, HDL, LDLCALC, TRIG, CHOLHDL, LDLDIRECT in the last 72 hours. Thyroid Function Tests: No results for input(s): TSH, T4TOTAL, FREET4, T3FREE, THYROIDAB in the last 72 hours. Anemia Panel: No results for input(s): VITAMINB12, FOLATE, FERRITIN, TIBC, IRON, RETICCTPCT  in the last 72 hours. Sepsis Labs: Recent Labs  Lab 07/14/20 0335 07/15/20 0254  PROCALCITON <0.10 <0.10    Recent Results (from the past 240 hour(s))  Resp Panel by RT-PCR (Flu A&B, Covid) Nasopharyngeal Swab     Status: None   Collection Time: 07/29/2020  3:07 PM   Specimen: Nasopharyngeal Swab; Nasopharyngeal(NP) swabs in vial transport medium  Result Value Ref Range Status   SARS Coronavirus 2 by RT PCR NEGATIVE NEGATIVE Final    Comment: (NOTE) SARS-CoV-2 target nucleic acids are NOT DETECTED.  The SARS-CoV-2 RNA is generally detectable in upper respiratory specimens during the acute phase of infection. The lowest concentration of SARS-CoV-2 viral copies this assay can detect is 138 copies/mL. A negative result does not preclude SARS-Cov-2 infection and should not be used as the sole basis for treatment or other patient management decisions. A negative result may occur with  improper specimen collection/handling, submission of specimen other than nasopharyngeal swab, presence of viral mutation(s) within the areas targeted by this assay, and inadequate number of viral copies(<138 copies/mL). A negative result must be combined with clinical observations, patient history, and epidemiological information. The expected result is Negative.  Fact Sheet for Patients:  EntrepreneurPulse.com.au  Fact Sheet for Healthcare Providers:  IncredibleEmployment.be  This test is no t yet approved or cleared by the [place] FDA and  has been authorized for detection and/or diagnosis of SARS-CoV-2 by FDA under an Emergency Use Authorization (EUA). This EUA will remain  in  effect (meaning this test can be used) for the duration of the COVID-19 declaration under Section 564(b)(1) of the Act, 21 U.S.C.section 360bbb-3(b)(1), unless the authorization is terminated  or revoked sooner.       Influenza A by PCR NEGATIVE NEGATIVE Final   Influenza B by  PCR NEGATIVE NEGATIVE Final    Comment: (NOTE) The Xpert Xpress SARS-CoV-2/FLU/RSV plus assay is intended as an aid in the diagnosis of influenza from Nasopharyngeal swab specimens and should not be used as a sole basis for treatment. Nasal washings and aspirates are unacceptable for Xpert Xpress SARS-CoV-2/FLU/RSV testing.  Fact Sheet for Patients: EntrepreneurPulse.com.au  Fact Sheet for Healthcare Providers: IncredibleEmployment.be  This test is not yet approved or cleared by the [place] FDA and has been authorized for detection and/or diagnosis of SARS-CoV-2 by FDA under an Emergency Use Authorization (EUA). This EUA will remain in effect (meaning this test can be used) for the duration of the COVID-19 declaration under Section 564(b)(1) of the Act, 21 U.S.C. section 360bbb-3(b)(1), unless the authorization is terminated or revoked.  Performed at [facility], [place] [address], [place], [place] [address]   Blood culture (routine single)     Status: None   Collection Time: 07/03/2020  3:55 PM   Specimen: BLOOD RIGHT FOREARM  Result Value Ref Range Status   Specimen Description BLOOD RIGHT FOREARM  Final   Special Requests   Final    BOTTLES DRAWN AEROBIC AND ANAEROBIC Blood Culture results may not be optimal due to an excessive volume of blood received in culture bottles   Culture   Final    NO GROWTH 5 DAYS Performed at [facility], [place] [address], [place], [place] [address]    Report Status 07/11/2020 FINAL  Final  Culture, blood (single)     Status: None   Collection Time: 07/04/2020  3:59 PM   Specimen: BLOOD  Result Value Ref Range Status   Specimen Description BLOOD LEFT ANTECUBITAL  Final   Special Requests   Final    BOTTLES DRAWN AEROBIC AND ANAEROBIC Blood Culture adequate volume   Culture   Final    NO GROWTH 5 DAYS Performed at [facility], [place] [address], [place], [place] [address]    Report Status  07/11/2020 FINAL  Final  Urine culture     Status: Abnormal   Collection Time: 07/14/2020  4:23 PM   Specimen: In/Out Cath Urine  Result Value Ref Range Status   Specimen Description IN/OUT CATH URINE  Final   Special Requests NONE  Final   Culture (A)  Final    >=100,000 COLONIES/mL VANCOMYCIN RESISTANT ENTEROCOCCUS >=100,000 COLONIES/mL GRANULICATELLA ADIACENS Standardized susceptibility testing for this organism is not available. Performed at [facility], [place] [address], [place], [place] [address]    Report Status 07/10/2020 FINAL  Final   Organism ID, Bacteria VANCOMYCIN RESISTANT ENTEROCOCCUS (A)  Final      Susceptibility   Vancomycin resistant enterococcus - MIC*    AMPICILLIN >=32 RESISTANT Resistant     NITROFURANTOIN 32 SENSITIVE Sensitive     VANCOMYCIN >=32 RESISTANT Resistant     LINEZOLID 2 SENSITIVE Sensitive     * >=100,000 COLONIES/mL VANCOMYCIN RESISTANT ENTEROCOCCUS  MRSA PCR Screening     Status: Abnormal   Collection Time: 07/09/20  4:16 AM   Specimen: Nasal Mucosa; Nasopharyngeal  Result Value Ref Range Status   MRSA by PCR POSITIVE (A) NEGATIVE Final    Comment:  The GeneXpert MRSA Assay (FDA approved for NASAL specimens only), is one component of a comprehensive MRSA colonization surveillance program. It is not intended to diagnose MRSA infection nor to guide or monitor treatment for MRSA infections. RESULT CALLED TO, READ BACK BY AND VERIFIED WITH: A CAIN RN 07/09/20 0254 JDW Performed at [facility], [place] [address], [place], [place] [address]      Radiology Studies: DG Chest 1 View  Result Date: 07/16/2020 CLINICAL DATA:  Respiratory distress EXAM: CHEST  1 VIEW COMPARISON:  Radiograph 07/14/2020 FINDINGS: Redemonstration of the diffuse interstitial opacity throughout both lungs with some increasing areas of more consolidative opacity in the retrocardiac space, right base and bilateral mid lungs. Pulmonary vascular congestion is  increased from prior as well. Likely slight interval increase in the size of bilateral pleural effusions as well. Enlarged cardiac silhouette is similar to comparison priors. The aorta is calcified. The remaining cardiomediastinal contours are unremarkable. Transesophageal tube tip terminates below the margins of imaging, beyond the GE junction. Telemetry leads and external support devices overlie the chest. No other acute osseous or soft tissue abnormality. Remote posttraumatic deformity distal right clavicle. Severe degenerative changes in the bilateral shoulders. Multilevel degenerative changes are present in the imaged portions of the spine. Midthoracic dextrocurvature is unchanged. IMPRESSION: 1. Persistent interstitial and developing airspace opacities and increasing pulmonary vascular congestion may reflect worsening edema, infection or ARDS. 2. Likely slight interval increase in size of bilateral pleural effusions as well. 3.  Aortic Atherosclerosis (ICD10-I70.0). Electronically Signed   By: Lovena Le M.D.   On: 07/16/2020 00:57   DG Chest Port 1 View  Result Date: 07/14/2020 CLINICAL DATA:  Fluid overload. EXAM: PORTABLE CHEST 1 VIEW COMPARISON:  July 11, 2020. FINDINGS: Stable cardiomediastinal silhouette. Feeding tube is seen entering stomach. No pneumothorax is noted. Mild bibasilar atelectasis is noted with probable small pleural effusions. Bony thorax is unremarkable. IMPRESSION: Mild bibasilar subsegmental atelectasis with probable small pleural effusions. Electronically Signed   By: Marijo Conception M.D.   On: 07/14/2020 10:24     LOS: 10 days   Antonieta Pert, MD [facility]  07/16/2020, 7:44 AM

## 2020-07-17 DIAGNOSIS — I4891 Unspecified atrial fibrillation: Secondary | ICD-10-CM

## 2020-07-17 DIAGNOSIS — E785 Hyperlipidemia, unspecified: Secondary | ICD-10-CM

## 2020-07-17 DIAGNOSIS — E1169 Type 2 diabetes mellitus with other specified complication: Secondary | ICD-10-CM

## 2020-07-17 DIAGNOSIS — R6521 Severe sepsis with septic shock: Secondary | ICD-10-CM

## 2020-07-17 DIAGNOSIS — Z4659 Encounter for fitting and adjustment of other gastrointestinal appliance and device: Secondary | ICD-10-CM

## 2020-07-17 LAB — COMPREHENSIVE METABOLIC PANEL
ALT: 29 U/L (ref 0–44)
AST: 19 U/L (ref 15–41)
Albumin: 2.8 g/dL — ABNORMAL LOW (ref 3.5–5.0)
Alkaline Phosphatase: 143 U/L — ABNORMAL HIGH (ref 38–126)
Anion gap: 11 (ref 5–15)
BUN: 30 mg/dL — ABNORMAL HIGH (ref 8–23)
CO2: 33 mmol/L — ABNORMAL HIGH (ref 22–32)
Calcium: 8.6 mg/dL — ABNORMAL LOW (ref 8.9–10.3)
Chloride: 87 mmol/L — ABNORMAL LOW (ref 98–111)
Creatinine, Ser: 0.91 mg/dL (ref 0.44–1.00)
GFR, Estimated: >60 mL/min (ref >60)
Glucose, Bld: 278 mg/dL — ABNORMAL HIGH (ref 70–99)
Potassium: 4.4 mmol/L (ref 3.5–5.1)
Sodium: 131 mmol/L — ABNORMAL LOW (ref 135–145)
Total Bilirubin: 0.9 mg/dL (ref 0.3–1.2)
Total Protein: 6.6 g/dL (ref 6.5–8.1)

## 2020-07-17 LAB — GLUCOSE, CAPILLARY
Glucose-Capillary: 228 mg/dL — ABNORMAL HIGH (ref 70–99)
Glucose-Capillary: 264 mg/dL — ABNORMAL HIGH (ref 70–99)
Glucose-Capillary: 190 mg/dL — ABNORMAL HIGH (ref 70–99)
Glucose-Capillary: 40 mg/dL — CL (ref 70–99)
Glucose-Capillary: 105 mg/dL — ABNORMAL HIGH (ref 70–99)

## 2020-07-17 LAB — CBC
HCT: 38 % (ref 36.0–46.0)
Hemoglobin: 11.5 g/dL — ABNORMAL LOW (ref 12.0–15.0)
MCH: 26.9 pg (ref 26.0–34.0)
MCHC: 30.3 g/dL (ref 30.0–36.0)
MCV: 89 fL (ref 80.0–100.0)
Platelets: 275 10*3/uL (ref 150–400)
RBC: 4.27 MIL/uL (ref 3.87–5.11)
RDW: 17.1 % — ABNORMAL HIGH (ref 11.5–15.5)
WBC: 21.6 10*3/uL — ABNORMAL HIGH (ref 4.0–10.5)
nRBC: 0 % (ref 0.0–0.2)

## 2020-07-17 LAB — PROCALCITONIN: Procalcitonin: 0.11 ng/mL

## 2020-07-17 MED ORDER — AMIODARONE HCL 200 MG PO TABS
400.0000 mg | ORAL_TABLET | Freq: Two times a day (BID) | ORAL | Status: DC
  Administered 2020-07-17 – 2020-07-18 (×2): 400 mg
  Filled 2020-07-17 (×2): qty 2

## 2020-07-17 MED ORDER — HYDRALAZINE HCL 10 MG PO TABS: 10.0000 mg | ORAL_TABLET | Freq: Four times a day (QID) | ORAL | Status: DC | PRN

## 2020-07-17 MED ORDER — DICLOFENAC SODIUM 1 % EX GEL
2.0000 g | Freq: Four times a day (QID) | CUTANEOUS | Status: DC
  Administered 2020-07-17 – 2020-07-18 (×3): 2 g via TOPICAL
  Filled 2020-07-17: qty 100

## 2020-07-17 MED ORDER — DILTIAZEM HCL ER COATED BEADS 240 MG PO CP24: 240.0000 mg | ORAL_CAPSULE | Freq: Every day | ORAL | Status: DC

## 2020-07-17 MED ORDER — DEXTROSE 50 % IV SOLN
25.0000 g | INTRAVENOUS | Status: AC
  Administered 2020-07-17: 25 g via INTRAVENOUS
  Filled 2020-07-17: qty 50

## 2020-07-17 MED ORDER — AMIODARONE HCL 200 MG PO TABS
400.0000 mg | ORAL_TABLET | Freq: Two times a day (BID) | ORAL | Status: DC
  Administered 2020-07-17: 400 mg via ORAL
  Filled 2020-07-17: qty 2

## 2020-07-17 MED ORDER — AMIODARONE HCL 200 MG PO TABS: 200.0000 mg | ORAL_TABLET | Freq: Every day | ORAL | Status: DC

## 2020-07-17 MED ORDER — AMIODARONE HCL 200 MG PO TABS: 400.0000 mg | ORAL_TABLET | Freq: Two times a day (BID) | ORAL | Status: DC

## 2020-07-17 MED ORDER — ACETAMINOPHEN 650 MG RE SUPP: 650.0000 mg | Freq: Four times a day (QID) | RECTAL | Status: DC | PRN

## 2020-07-17 MED ORDER — CHLORHEXIDINE GLUCONATE 0.12 % MT SOLN
OROMUCOSAL | Status: AC
  Administered 2020-07-17: 15 mL via OROMUCOSAL
  Filled 2020-07-17: qty 15

## 2020-07-17 MED ORDER — ACETAMINOPHEN 160 MG/5ML PO SOLN
650.0000 mg | Freq: Four times a day (QID) | ORAL | Status: DC | PRN
  Filled 2020-07-17: qty 20.3

## 2020-07-17 MED ORDER — DILTIAZEM HCL 60 MG PO TABS
60.0000 mg | ORAL_TABLET | Freq: Four times a day (QID) | ORAL | Status: DC
  Administered 2020-07-18 (×2): 60 mg
  Filled 2020-07-17 (×2): qty 1

## 2020-07-17 NOTE — Progress Notes (Signed)
Pt has pulled out another IV line per am RN, unable to restart another line. IV team paged and abt marked in the [facility] Urge the care team to consider a more stable IV solution

## 2020-07-17 NOTE — Progress Notes (Addendum)
Progress Note  Patient Name: Hannah [name] Date of Encounter: 07/17/2020  [facility] Cardiologist: Mertie Moores, MD   Subjective   Worsening SOB from prior with increasing oxygen requirements.  Inpatient Medications    Scheduled Meds: . apixaban  2.5 mg Per Tube BID  . chlorhexidine gluconate (MEDLINE KIT)  15 mL Mouth Rinse BID  . Chlorhexidine Gluconate Cloth  6 each Topical Daily  . diltiazem  60 mg Per Tube Q6H  . docusate  100 mg Per Tube BID  . feeding supplement (PROSource TF)  45 mL Per Tube Daily  . free water  110 mL Per Tube Q6H  . furosemide  20 mg Intravenous Once  . furosemide  40 mg Per Tube Daily  . insulin aspart  0-9 Units Subcutaneous Q4H  . insulin aspart  4 Units Subcutaneous Q6H  . levothyroxine  25 mcg Per Tube QAC breakfast  . phosphorus  250 mg Per NG tube TID  . polyethylene glycol  17 g Per Tube Daily  . Resource ThickenUp Clear   Oral Once   Continuous Infusions: . sodium chloride Stopped (07/16/20 0201)  . amiodarone 30 mg/hr (07/17/20 0600)  . feeding supplement (OSMOLITE 1.2 CAL) 1,000 mL (07/16/20 0630)   PRN Meds: sodium chloride, acetaminophen **OR** acetaminophen, albuterol, haloperidol lactate, hydrALAZINE, labetalol, Racepinephrine HCl   Vital Signs    Vitals:   07/16/20 1600 07/16/20 2000 07/17/20 0057 07/17/20 0400  BP: (!) 187/76 (!) 170/62  (!) 160/125  Pulse: 85 72  99  Resp: (!) 29 (!) 21  (!) 35  Temp: 98.2 F (36.8 C) 97.8 F (36.6 C)  98.1 F (36.7 C)  TempSrc: Axillary Axillary  Oral  SpO2: 100% (!) 88%  (!) 89%  Weight:   58.7 kg   Height:        Intake/Output Summary (Last 24 hours) at 07/17/2020 4970 Last data filed at 07/16/2020 2000 Gross per 24 hour  Intake 4854.26 ml  Output -  Net 4854.26 ml   Last 3 Weights 07/17/2020 07/15/2020 07/14/2020  Weight (lbs) 129 lb 6.6 oz 131 lb 2.8 oz 130 lb 11.7 oz  Weight (kg) 58.7 kg 59.5 kg 59.3 kg      Telemetry    Atrial fibrillation with rate control -  Personally Reviewed  ECG    No new tracings - Personally Reviewed  Physical Exam   GEN: frail elderly female appearing mildly short of breath.   Neck: No JVD Cardiac: IRIR, +murmur, no rubs or gallops.  Respiratory: coarse upper airway sounds anteriorly with diminished breath sounds posteriorly GI: Soft, nontender, non-distended  MS: No edema; No deformity. Neuro:  Nonfocal  Psych: demented, calm, in mittens  Labs    High Sensitivity Troponin:   Recent Labs  Lab 07/08/20 1828 07/08/20 2028  TROPONINIHS 61* 59*      Chemistry Recent Labs  Lab 07/14/20 0335 07/16/20 0657 07/17/20 0547  NA 137 133* 131*  K 4.0 4.2 4.4  CL 98 89* 87*  CO2 27 31 33*  GLUCOSE 176* 266* 278*  BUN 32* 26* 30*  CREATININE 1.09* 0.89 0.91  CALCIUM 8.6* 8.4* 8.6*  PROT  --   --  6.6  ALBUMIN  --   --  2.8*  AST  --   --  19  ALT  --   --  29  ALKPHOS  --   --  143*  BILITOT  --   --  0.9  GFRNONAA 49* >60 >60  ANIONGAP '12 13 11     '$ Hematology Recent Labs  Lab 07/15/20 0254 07/16/20 0418 07/17/20 0547  WBC 18.0* 19.1* 21.6*  RBC 4.28 4.03 4.27  HGB 11.3* 10.6* 11.5*  HCT 38.8 36.9 38.0  MCV 90.7 91.6 89.0  MCH 26.4 26.3 26.9  MCHC 29.1* 28.7* 30.3  RDW 17.2* 17.0* 17.1*  PLT 279 231 275    BNPNo results for input(s): BNP, PROBNP in the last 168 hours.   DDimer No results for input(s): DDIMER in the last 168 hours.   Radiology    DG Chest 1 View  Result Date: 07/16/2020 CLINICAL DATA:  Respiratory distress EXAM: CHEST  1 VIEW COMPARISON:  Radiograph 07/14/2020 FINDINGS: Redemonstration of the diffuse interstitial opacity throughout both lungs with some increasing areas of more consolidative opacity in the retrocardiac space, right base and bilateral mid lungs. Pulmonary vascular congestion is increased from prior as well. Likely slight interval increase in the size of bilateral pleural effusions as well. Enlarged cardiac silhouette is similar to comparison priors. The  aorta is calcified. The remaining cardiomediastinal contours are unremarkable. Transesophageal tube tip terminates below the margins of imaging, beyond the GE junction. Telemetry leads and external support devices overlie the chest. No other acute osseous or soft tissue abnormality. Remote posttraumatic deformity distal right clavicle. Severe degenerative changes in the bilateral shoulders. Multilevel degenerative changes are present in the imaged portions of the spine. Midthoracic dextrocurvature is unchanged. IMPRESSION: 1. Persistent interstitial and developing airspace opacities and increasing pulmonary vascular congestion may reflect worsening edema, infection or ARDS. 2. Likely slight interval increase in size of bilateral pleural effusions as well. 3.  Aortic Atherosclerosis (ICD10-I70.0). Electronically Signed   By: Lovena Le M.D.   On: 07/16/2020 00:57    Cardiac Studies   Echocardiogram 07/08/20: 1. Left ventricular ejection fraction, by estimation, is 50 to 55%. The  left ventricle has low normal function. The left ventricle demonstrates  global hypokinesis. There is moderate concentric left ventricular  hypertrophy. Left ventricular diastolic  function could not be evaluated.   2. Right ventricular systolic function is mildly reduced. The right  ventricular size is not well visualized. There is moderately elevated  pulmonary artery systolic pressure. The estimated right ventricular  systolic pressure is 65.0 mmHg.   3. Left atrial size was mild to moderately dilated.   4. The mitral valve is degenerative. Moderate mitral valve regurgitation.  No evidence of mitral stenosis. Moderate mitral annular calcification.   5. The aortic valve is tricuspid. There is severe calcifcation of the  aortic valve. There is moderate thickening of the aortic valve. The non  coronary cusp is immobile. Aortic valve regurgitation is mild. Mild to  moderate aortic valve stenosis. Aortic  valve area, by  VTI measures 1.23 cm. Aortic valve mean gradient measures  9.8 mmHg. Aortic valve Vmax measures 1.98 m/s. The DI is 0.54. The AVA is  underestimated due to small LVOT measurement but likely more than mild AS  as the SVI is low at 26.  Findings consistent with low flow low gradient Aortic stenosis.   6. The inferior vena cava is dilated in size with <50% respiratory  variability, suggesting right atrial pressure of 15 mmHg.   7. Trivial pericardial effusion anterior to the right ventricle.   Patient Profile     87 y.o. female with PMH of HTN, HLD, DM type 2, COPD on home O2 3L, and dementia who is being followed by cardiology for atrial fibrillation with RVR  Assessment & Plan    1. Atrial fibrillation with RVR: rates remain poorly controlled. Suspect this is driven by underlying UTI.  - will consolidated to 240 mg XL today - Would be reasonable to transition 400 mg PO BID for one week then 200 mg PO Daily, given volume issues - Can continue eliquis 2.5mg  BID (adjusted for age and weight), though ultimately is a poor candidate at home due to fall risk.  (was recently enrolled in hospice)  2. Sepsis due to VRE UTI: currently on linezolid.  - Continue management per primary team.  3. Acute on chronic respiratory failure in patient with COPD on home O2: patient initially required BiPAP on arrival, briefly intubated this admission, and now continues to require intermittent BiPAP. She remains supportive care with nebulizers; agree with 40 mg IV Lasix daily (and may need additional PRN dosing) while on IV amiodarone - Continue management per primary team - Able to wean patient down to 3 L during exam without hypoxia  4. Dysphagia: barium swall with narrowing of distal esophagus. Seen by GI and high risk for EGD. Conservative care recommended. She has a nasal feeding tube in place - Continue management per primary team  5. HTN: BP persistently elevated.  - if within goals of care would start  hydralazine 25 mg PO BID and treat persistent; if gial of care is to return to hospice care reasonable to defer   Remainder of care per primary team: - Hypothyroidism - DM type 2 - Encephalopathy  Possible August 17, 2020 sign off    For questions or updates, please contact [place] Please consult [email or web address] for contact info under        Signed, Werner Lean, MD  07/17/2020, 8:08 AM    Discussed at length with pharmacy team:  CD continueded because of per tube formulation.  Amio per tube.  Low threshold for IV lasix if worsening hypoxia.  Werner Lean, MD

## 2020-07-17 NOTE — Progress Notes (Signed)
  Speech Language Pathology Treatment: Dysphagia  Patient Details Name: Hannah [name] MRN: [id number] DOB: 29-Sep-1931 Today's Date: 07/17/2020 Time: 4193-7902 SLP Time Calculation (min) (ACUTE ONLY): 10 min  Assessment / Plan / Recommendation Clinical Impression  Upon entering room, pt saturating at 84% with Katherine in place - she had pulled off the non-rebreather set at 15L.  NRB replaced and Sp02 increased to mid-90s.  Pt alert, tachypneic to mid 30s.  Oral mucosa improved today. She was provided with several ice chips in upright position - coughing less today; enjoying have some ice.  Teaspoons of water elicited immediate concerns for aspiration; trials ceased.  No improvements in swallow function based on clinical signs.  Continue NPO, oral care QD and allow ice chips.  Cortrak placed on 1/14 in the hopes that ability to swallow might improve over a few days.  So far there are no improvements and respiratory issues have begun to interfere with swallow/breathing coordination.  SLP will follow.   Recommend   HPI HPI: 87 year old female from nursing home coming in with sepsis due to multifocal pneumonia and acute hypoxic respiratory failure. Clinical swallow evaluation was completed 1/09 with concerns for esophageal dysphagia, no real concerns for oropharyngeal dysphagia; full liquids recommended pending GI consult. Evening of 1/10, she had desaturation to the point of near arrest.  Code blue called and pt intubated by anesthesia before being transferred to [facility] ICU. Per Dr. Eugenia Pancoast note 1/12:  Pt "had a smooth narrowing of her distal esophagus on Barium esophagram 2 days ago.  Ideally I would recommend EGD for further evaluation however she is at quite [place] for any invasive procedures: chronically on 3LNC, she has pneumonia now, she just suffered a respiratory arrest and her Baseline dementia obviously complicates the picture as well."      SLP Plan  Continue with current plan of care        Recommendations  Diet recommendations: NPO Medication Administration: Via alternative means                Oral Care Recommendations: Oral care QID Follow up Recommendations: 24 hour supervision/assistance Plan: Continue with current plan of care       GO                Juan Quam Laurice 07/17/2020, 9:37 AM  Estill Bamberg L. Tivis Ringer, [place] Office number [phone] Pager [phone]

## 2020-07-17 NOTE — Progress Notes (Signed)
PROGRESS NOTE    Hannah [name]   UXL:[id number]  DOB: December 21, 1931  DOA: 06/29/2020 PCP: Venetia Maxon, [name], MD   Brief Narrative:  Hannah [name] 87 y.o. female with medical history significant of DM2, COPD on 3L at baseline, GERD, HLD, HTN, dementia, wheelchair-bound who was enrolled in hospice of [name] prior to admission who was recently diagnosed with pneumonia and was started on antibiotics on day of admission brought to the ED  07/14/2020 with hypoxia 79% RA.  She was admitted for septic shock secondary to pneumonia complicated by A-fib with RVR.  On 1/30 she was desaturating. She was intubated- extubated on 1/11 but cor track remained due to dysphagia.  1/13. Needed BiPAP- started on lasix   Treated with IV Amiodarone for A-fib.    Subjective: Pain in right neck. The patient is barely able to speak or keep eyes open.     Assessment & Plan:   Principal Problem:   Septic shock  - acute respiratory failure due to Multifocal pneumonia with underlying COPD - upon further investigation with MBS, it appears that the tablet was unable to pass into the stomach- per granddaughter at bedside, the patient has had many esophageal dilatations  - at this time it seems she is too weak to tolerate this - cont to watch for aspiration as she remains on tube feeds- she is to be intubated if she stops breathing  Active Problems: Esophageal narrowing - see above - at this time, - per SLP, ok to trail liquid diet- if she is able to remain alert for feeding, will attempt oral liquids  Acute exacerbation of systolic CHF - intermittently dosing with IV LASix - cardiolgoy assisting  A-fib- new onset- persistent - on Amiodarone infusion and per tube Cardizem - cardiology changing Amio to per tube today  VRE UTI - on Linezolid since 1/12- plan for 7 days    Dementia   Hypothyroid - cont Synthroid    DM2 (diabetes mellitus, type 2) - cont SSI Hemoglobin A1C    Component Value Date/Time    HGBA1C 5.3 07/07/2020 0004     Time spent in minutes: 45 DVT prophylaxis: apixaban (ELIQUIS) tablet 2.5 mg Start: 07/13/20 1045 apixaban (ELIQUIS) tablet 2.5 mg  Code Status: partial code- intubate an use BiPAP Family Communication: grandaughter at bedside Disposition Plan:  Status is: Inpatient  Remains inpatient appropriate because:IV treatments appropriate due to intensity of illness or inability to take PO   Dispo: The patient is from: home              Anticipated d/c is to: TBD              Anticipated d/c date is: > 3 days              Patient currently is not medically stable to d/c.   Consultants:   PCCM  Cardiology  Palliative care Procedures:   Intubation/ extubation Antimicrobials:  Anti-infectives (From admission, onward)   Start     Dose/Rate Route Frequency Ordered Stop   07/10/20 1015  linezolid (ZYVOX) IVPB 600 mg        600 mg 300 mL/hr over 60 Minutes Intravenous Every 12 hours 07/10/20 0923 07/17/20 0208   07/09/20 0345  vancomycin (VANCOREADY) IVPB 750 mg/150 mL        750 mg 150 mL/hr over 60 Minutes Intravenous  Once 07/09/20 0250 07/09/20 0506   07/07/20 2300  ceFEPIme (MAXIPIME) 2 g in sodium chloride 0.9 %  100 mL IVPB        2 g 200 mL/hr over 30 Minutes Intravenous Every 24 hours 07/23/2020 2331 07/12/20 0633   07/02/2020 2331  vancomycin variable dose per unstable renal function (pharmacist dosing)  Status:  Discontinued         Does not apply See admin instructions 07/17/2020 2331 07/10/20 0923   07/01/2020 2330  vancomycin (VANCOREADY) IVPB 1250 mg/250 mL        1,250 mg 166.7 mL/hr over 90 Minutes Intravenous  Once 07/10/2020 2325 07/07/20 0317   07/13/2020 2330  ceFEPIme (MAXIPIME) 2 g in sodium chloride 0.9 % 100 mL IVPB        2 g 200 mL/hr over 30 Minutes Intravenous  Once 07/11/2020 2325 07/07/20 0024   07/19/2020 1600  cefTRIAXone (ROCEPHIN) 2 g in sodium chloride 0.9 % 100 mL IVPB  Status:  Discontinued        2 g 200 mL/hr over 30 Minutes  Intravenous Every 24 hours 07/20/2020 1557 07/05/2020 2325   07/21/2020 1600  azithromycin (ZITHROMAX) 500 mg in sodium chloride 0.9 % 250 mL IVPB  Status:  Discontinued        500 mg 250 mL/hr over 60 Minutes Intravenous Every 24 hours 07/22/2020 1557 07/09/20 0807       Objective: Vitals:   07/17/20 0057 07/17/20 0400 07/17/20 0850 07/17/20 1159  BP:  (!) 160/125 (!) 132/98 118/77  Pulse:  99 67   Resp:  (!) 35 (!) 22   Temp:  98.1 F (36.7 C) 98 F (36.7 C)   TempSrc:  Oral Oral   SpO2:  (!) 89% 96%   Weight: 58.7 kg     Height:        Intake/Output Summary (Last 24 hours) at 07/17/2020 1558 Last data filed at 07/16/2020 2000 Gross per 24 hour  Intake 4854.26 ml  Output -  Net 4854.26 ml   Filed Weights   07/14/20 0451 07/15/20 0435 07/17/20 0057  Weight: 59.3 kg 59.5 kg 58.7 kg    Examination: General exam:  on my exam, she is quite somnolent, not participating in conversation and laying in bed with mouth wide open with a  Face mask and cor trac- she does awaken and is able to follow some commands- speech is very difficult to understand as it is nearly inaudible  HEENT: PERRL, oral mucosa very dry, no sclera icterus or thrush Respiratory system: rhonchi- RR in high 20s- has 100 % O2 via face mask Cardiovascular system: S1 & S2 heard, IIRR   Gastrointestinal system: Abdomen soft, non-tender, nondistended. Normal bowel sounds. Central nervous system:   No focal neurological deficits. Extremities: No cyanosis, clubbing or edema Skin: No rashes or ulcers Psychiatry:  Difficult to assess    Data Reviewed: I have personally reviewed following labs and imaging studies  CBC: Recent Labs  Lab 07/13/20 0111 07/14/20 0335 07/15/20 0254 07/16/20 0418 07/17/20 0547  WBC 11.3* 24.0* 18.0* 19.1* 21.6*  HGB 10.9* 11.9* 11.3* 10.6* 11.5*  HCT 36.5 40.3 38.8 36.9 38.0  MCV 88.0 89.6 90.7 91.6 89.0  PLT 317 348 279 231 528   Basic Metabolic Panel: Recent Labs  Lab  07/12/20 0140 07/12/20 1431 07/13/20 0111 07/13/20 1625 07/14/20 0335 07/16/20 0657 07/17/20 0547  NA 135  --  137  --  137 133* 131*  K 4.6  --  3.9  --  4.0 4.2 4.4  CL 103  --  98  --  98 89* 87*  CO2 18*  --  26  --  27 31 33*  GLUCOSE 160*  --  254*  --  176* 266* 278*  BUN 57*  --  42*  --  32* 26* 30*  CREATININE 1.47*  --  1.41*  --  1.09* 0.89 0.91  CALCIUM 8.7*  --  9.0  --  8.6* 8.4* 8.6*  MG  --  2.0 1.7 2.2  --  1.8  --   PHOS  --  2.4* 1.7* 1.7* 2.0*  --   --    GFR: Estimated Creatinine Clearance: 38.3 mL/min (by C-G formula based on SCr of 0.91 mg/dL). Liver Function Tests: Recent Labs  Lab 07/17/20 0547  AST 19  ALT 29  ALKPHOS 143*  BILITOT 0.9  PROT 6.6  ALBUMIN 2.8*   No results for input(s): LIPASE, AMYLASE in the last 168 hours. No results for input(s): AMMONIA in the last 168 hours. Coagulation Profile: No results for input(s): INR, PROTIME in the last 168 hours. Cardiac Enzymes: No results for input(s): CKTOTAL, CKMB, CKMBINDEX, TROPONINI in the last 168 hours. BNP (last 3 results) No results for input(s): PROBNP in the last 8760 hours. HbA1C: No results for input(s): HGBA1C in the last 72 hours. CBG: Recent Labs  Lab 07/16/20 2022 07/16/20 2103 07/17/20 0357 07/17/20 0745 07/17/20 1151  GLUCAP 182* 168* 228* 264* 190*   Lipid Profile: No results for input(s): CHOL, HDL, LDLCALC, TRIG, CHOLHDL, LDLDIRECT in the last 72 hours. Thyroid Function Tests: No results for input(s): TSH, T4TOTAL, FREET4, T3FREE, THYROIDAB in the last 72 hours. Anemia Panel: No results for input(s): VITAMINB12, FOLATE, FERRITIN, TIBC, IRON, RETICCTPCT in the last 72 hours. Urine analysis:    Component Value Date/Time   COLORURINE AMBER (A) 07/24/2020 1637   APPEARANCEUR CLOUDY (A) 07/25/2020 1637   LABSPEC 1.018 07/17/2020 1637   PHURINE 5.0 07/12/2020 1637   GLUCOSEU NEGATIVE 07/14/2020 1637   HGBUR NEGATIVE 07/24/2020 1637   BILIRUBINUR NEGATIVE  07/20/2020 1637   KETONESUR NEGATIVE 07/17/2020 1637   PROTEINUR 30 (A) 07/25/2020 1637   NITRITE NEGATIVE 07/09/2020 1637   LEUKOCYTESUR TRACE (A) 06/30/2020 1637   Sepsis Labs: $[email or web address]$ (procalcitonin:4,lacticidven:4) ) Recent Results (from the past 240 hour(s))  MRSA PCR Screening     Status: Abnormal   Collection Time: 07/09/20  4:16 AM   Specimen: Nasal Mucosa; Nasopharyngeal  Result Value Ref Range Status   MRSA by PCR POSITIVE (A) NEGATIVE Final    Comment:        The GeneXpert MRSA Assay (FDA approved for NASAL specimens only), is one component of a comprehensive MRSA colonization surveillance program. It is not intended to diagnose MRSA infection nor to guide or monitor treatment for MRSA infections. RESULT CALLED TO, READ BACK BY AND VERIFIED WITH: A CAIN RN 07/09/20 6387 JDW Performed at [facility], [place] [address] [place], [place] [address]          Radiology Studies: DG Chest 1 View  Result Date: 07/16/2020 CLINICAL DATA:  Respiratory distress EXAM: CHEST  1 VIEW COMPARISON:  Radiograph 07/14/2020 FINDINGS: Redemonstration of the diffuse interstitial opacity throughout both lungs with some increasing areas of more consolidative opacity in the retrocardiac space, right base and bilateral mid lungs. Pulmonary vascular congestion is increased from prior as well. Likely slight interval increase in the size of bilateral pleural effusions as well. Enlarged cardiac silhouette is similar to comparison priors. The aorta is calcified. The remaining cardiomediastinal contours are unremarkable. Transesophageal tube tip  terminates below the margins of imaging, beyond the GE junction. Telemetry leads and external support devices overlie the chest. No other acute osseous or soft tissue abnormality. Remote posttraumatic deformity distal right clavicle. Severe degenerative changes in the bilateral shoulders. Multilevel degenerative changes are present in the imaged portions  of the spine. Midthoracic dextrocurvature is unchanged. IMPRESSION: 1. Persistent interstitial and developing airspace opacities and increasing pulmonary vascular congestion may reflect worsening edema, infection or ARDS. 2. Likely slight interval increase in size of bilateral pleural effusions as well. 3.  Aortic Atherosclerosis (ICD10-I70.0). Electronically Signed   By: Lovena Le M.D.   On: 07/16/2020 00:57      Scheduled Meds: . [START ON 07/24/2020] amiodarone  200 mg Oral Daily  . amiodarone  400 mg Oral BID  . apixaban  2.5 mg Per Tube BID  . chlorhexidine gluconate (MEDLINE KIT)  15 mL Mouth Rinse BID  . Chlorhexidine Gluconate Cloth  6 each Topical Daily  . [START ON 20-Jul-2020] diltiazem  240 mg Oral Daily  . diltiazem  60 mg Per Tube Q6H  . docusate  100 mg Per Tube BID  . feeding supplement (PROSource TF)  45 mL Per Tube Daily  . free water  110 mL Per Tube Q6H  . furosemide  40 mg Per Tube Daily  . insulin aspart  0-9 Units Subcutaneous Q4H  . insulin aspart  4 Units Subcutaneous Q6H  . levothyroxine  25 mcg Per Tube QAC breakfast  . phosphorus  250 mg Per NG tube TID  . polyethylene glycol  17 g Per Tube Daily   Continuous Infusions: . sodium chloride Stopped (07/16/20 0201)  . feeding supplement (OSMOLITE 1.2 CAL) 1,000 mL (07/16/20 0630)     LOS: 11 days      Debbe Odea, MD [facility] Pager: [email or web address] 07/17/2020, 3:58 PM

## 2020-07-18 ENCOUNTER — Inpatient Hospital Stay (HOSPITAL_COMMUNITY)

## 2020-07-18 DIAGNOSIS — J90 Pleural effusion, not elsewhere classified: Secondary | ICD-10-CM | POA: Diagnosis not present

## 2020-07-18 DIAGNOSIS — I517 Cardiomegaly: Secondary | ICD-10-CM | POA: Diagnosis not present

## 2020-07-18 DIAGNOSIS — Z515 Encounter for palliative care: Secondary | ICD-10-CM | POA: Diagnosis not present

## 2020-07-18 DIAGNOSIS — R6521 Severe sepsis with septic shock: Secondary | ICD-10-CM | POA: Diagnosis not present

## 2020-07-18 DIAGNOSIS — R0902 Hypoxemia: Secondary | ICD-10-CM | POA: Diagnosis not present

## 2020-07-18 DIAGNOSIS — Z7189 Other specified counseling: Secondary | ICD-10-CM

## 2020-07-18 DIAGNOSIS — J189 Pneumonia, unspecified organism: Secondary | ICD-10-CM | POA: Diagnosis not present

## 2020-07-18 DIAGNOSIS — R0602 Shortness of breath: Secondary | ICD-10-CM | POA: Diagnosis not present

## 2020-07-18 DIAGNOSIS — Z66 Do not resuscitate: Secondary | ICD-10-CM | POA: Diagnosis not present

## 2020-07-18 DIAGNOSIS — J811 Chronic pulmonary edema: Secondary | ICD-10-CM | POA: Diagnosis not present

## 2020-07-18 DIAGNOSIS — J9811 Atelectasis: Secondary | ICD-10-CM | POA: Diagnosis not present

## 2020-07-18 DIAGNOSIS — I509 Heart failure, unspecified: Secondary | ICD-10-CM | POA: Diagnosis not present

## 2020-07-18 DIAGNOSIS — I10 Essential (primary) hypertension: Secondary | ICD-10-CM | POA: Diagnosis not present

## 2020-07-18 DIAGNOSIS — A419 Sepsis, unspecified organism: Secondary | ICD-10-CM | POA: Diagnosis not present

## 2020-07-18 LAB — GLUCOSE, CAPILLARY
Glucose-Capillary: 131 mg/dL — ABNORMAL HIGH (ref 70–99)
Glucose-Capillary: 137 mg/dL — ABNORMAL HIGH (ref 70–99)
Glucose-Capillary: 138 mg/dL — ABNORMAL HIGH (ref 70–99)
Glucose-Capillary: 192 mg/dL — ABNORMAL HIGH (ref 70–99)
Glucose-Capillary: >600 mg/dL (ref 70–99)

## 2020-07-18 LAB — BRAIN NATRIURETIC PEPTIDE: B Natriuretic Peptide: 442.3 pg/mL — ABNORMAL HIGH (ref 0.0–100.0)

## 2020-07-18 LAB — PROCALCITONIN: Procalcitonin: 0.11 ng/mL

## 2020-07-18 MED ORDER — MORPHINE SULFATE (PF) 2 MG/ML IV SOLN
2.0000 mg | INTRAVENOUS | Status: DC | PRN
  Administered 2020-07-18 (×2): 2 mg via INTRAVENOUS
  Filled 2020-07-18 (×2): qty 1

## 2020-07-18 MED ORDER — MORPHINE 100MG IN NS 100ML (1MG/ML) PREMIX INFUSION
1.0000 mg/h | INTRAVENOUS | Status: DC
  Administered 2020-07-18: 4 mg/h via INTRAVENOUS
  Administered 2020-07-18: 3 mg/h via INTRAVENOUS
  Administered 2020-07-18: 1 mg/h via INTRAVENOUS
  Filled 2020-07-18: qty 100

## 2020-07-18 MED ORDER — HYPROMELLOSE (GONIOSCOPIC) 2.5 % OP SOLN
2.0000 [drp] | Freq: Four times a day (QID) | OPHTHALMIC | Status: DC | PRN
  Filled 2020-07-18: qty 15

## 2020-07-18 MED ORDER — ONDANSETRON HCL 4 MG/2ML IJ SOLN: 4.0000 mg | Freq: Four times a day (QID) | INTRAMUSCULAR | Status: DC | PRN

## 2020-07-18 MED ORDER — MORPHINE BOLUS VIA INFUSION
2.0000 mg | INTRAVENOUS | Status: DC | PRN
  Filled 2020-07-18: qty 2

## 2020-07-18 MED ORDER — GLYCOPYRROLATE 0.2 MG/ML IJ SOLN
0.4000 mg | INTRAMUSCULAR | Status: DC
  Administered 2020-07-18: 0.4 mg via INTRAVENOUS
  Filled 2020-07-18 (×2): qty 2

## 2020-07-18 MED ORDER — LORAZEPAM 2 MG/ML IJ SOLN
0.5000 mg | INTRAMUSCULAR | Status: DC | PRN
  Administered 2020-07-18: 1 mg via INTRAVENOUS
  Administered 2020-07-18: 0.5 mg via INTRAVENOUS
  Filled 2020-07-18 (×2): qty 1

## 2020-07-18 MED ORDER — FUROSEMIDE 10 MG/ML IJ SOLN
40.0000 mg | Freq: Once | INTRAMUSCULAR | Status: AC
  Administered 2020-07-18: 40 mg via INTRAVENOUS
  Filled 2020-07-18: qty 4

## 2020-07-18 MED ORDER — INSULIN GLARGINE 100 UNIT/ML ~~LOC~~ SOLN
8.0000 [IU] | Freq: Every day | SUBCUTANEOUS | Status: DC
  Administered 2020-07-18: 8 [IU] via SUBCUTANEOUS
  Filled 2020-07-18: qty 0.08

## 2020-07-18 MED ORDER — FUROSEMIDE 10 MG/ML IJ SOLN
40.0000 mg | Freq: Every day | INTRAMUSCULAR | Status: DC
  Administered 2020-07-18: 40 mg via INTRAVENOUS
  Filled 2020-07-18: qty 4

## 2020-07-30 NOTE — Death Summary Note (Signed)
DEATH SUMMARY   Patient Details  Name: Hannah [name] MRN: [id number] DOB: [birth year]  Admission/Discharge Information   Admit Date:  Aug 01, 2020  Date of Death: Date of Death: 08/13/2020  Time of Death: Time of Death: 11-01-15  Length of Stay: 11-04-2022  Referring Physician: Street, [name], MD      Diagnoses  Preliminary cause of death:  Secondary Diagnoses (including complications and co-morbidities):  Principal Problem:   Septic shock ([place]) Active Problems:   COPD (chronic obstructive pulmonary disease) ([place])   Multifocal pneumonia   Acute on chronic respiratory failure with hypoxia and hypercapnia (HCC)   Atrial fibrillation with RVR ([place])   GERD (gastroesophageal reflux disease)   DM2 (diabetes mellitus, type 2) ([place])   Essential hypertension   CKD (chronic kidney disease), stage III ([place])   Type 2 diabetes mellitus with hyperlipidemia ([place])   Urinary tract infection without hematuria   Brief Hospital Course (including significant findings, care, treatment, and services provided and events leading to death)  Hannah [name] is a 87 y.o. year old female who has DM2, COPD on 3L at baseline, GERD, HLD, HTN, dementia, wheelchair-bound who was enrolled in hospice of [name] prior to admission who was recently diagnosed with pneumonia and was started on antibiotics on day of admission brought to the ED Feb 03, 2022with hypoxia 79% RA.  She was admitted for septic shock secondary to pneumonia complicated by A-fib with RVR.   On 1/30 she was desaturating and was subsequently intubated She was able to be extubated on 1/11 but cor track remained due to dysphagia and inability to remain alert 1/13. Needed BiPAP for distress- started on lasix - treated with IV Amiodarone for A-fib.     Septic shock  - acute respiratory failure due to Multifocal pneumonia with underlying COPD - upon further investigation of dysphagia with MBS, it appears that the tablet was unable to pass into the stomach- per  granddaughter at bedside, the patient has had many esophageal dilatations  - suspect pneumonia may have likely been due to aspiration resulting from esophageal stricture - respiratory issues complicated further by Acute CHF requiring Lasix - 08-13-22 condition noted to have worsened over night and I suspect she may have aspirated again- - pulse ox is in 80s on 100% FiO2- she is intermittently alert and visibly struggling to breath - I had a conversation with both son and daughter- her family decided to pursue DNR and comfort care measures rather than re-intubated her - palliative care team assisted with the plan and the patient was given comfort meds including Morphine for her respiratory distress- she expired later the same  day  Active Problems: Esophageal narrowing - see above  - per SLP, ok to trail liquid diet- her respiratory status has limited US from allowing her to start feeds  Acute exacerbation of systolic CHF - intermittently were dosing with IV Lasix   A-fib- new onset- persistent - treated with Amiodarone infusion and per tube Cardizem  VRE UTI - on Linezolid from 1/12- 1/19    Dementia   Hypothyroid - Synthroid    DM2 (diabetes mellitus, type 2) Hemoglobin A1C Labs (Brief)          Component Value Date/Time   HGBA1C 5.3 07/07/2020 0004           Pertinent Labs and Studies  Significant Diagnostic Studies DG Chest 1 View  Result Date: 13-Aug-2020 CLINICAL DATA:  Shortness of breath EXAM: CHEST  1 VIEW COMPARISON:  July 16, 2020 FINDINGS:  The cardiac silhouette is unchanged but enlarged. Aortic calcifications are noted. There are prominent interstitial lung markings and bilateral pleural effusions similar to prior study. There are bibasilar airspace opacities favored to represent atelectasis. There is no pneumothorax. There are end-stage degenerative changes of both glenohumeral joints. There is an old healed right clavicle fracture. IMPRESSION: 1.  Cardiomegaly with findings of congestive heart failure. 2. Bibasilar airspace opacities favored to represent atelectasis with an infiltrate not excluded. Electronically Signed   By: Constance Holster M.D.   On: 2020/08/12 00:39   DG Chest 1 View  Result Date: 07/16/2020 CLINICAL DATA:  Respiratory distress EXAM: CHEST  1 VIEW COMPARISON:  Radiograph 07/14/2020 FINDINGS: Redemonstration of the diffuse interstitial opacity throughout both lungs with some increasing areas of more consolidative opacity in the retrocardiac space, right base and bilateral mid lungs. Pulmonary vascular congestion is increased from prior as well. Likely slight interval increase in the size of bilateral pleural effusions as well. Enlarged cardiac silhouette is similar to comparison priors. The aorta is calcified. The remaining cardiomediastinal contours are unremarkable. Transesophageal tube tip terminates below the margins of imaging, beyond the GE junction. Telemetry leads and external support devices overlie the chest. No other acute osseous or soft tissue abnormality. Remote posttraumatic deformity distal right clavicle. Severe degenerative changes in the bilateral shoulders. Multilevel degenerative changes are present in the imaged portions of the spine. Midthoracic dextrocurvature is unchanged. IMPRESSION: 1. Persistent interstitial and developing airspace opacities and increasing pulmonary vascular congestion may reflect worsening edema, infection or ARDS. 2. Likely slight interval increase in size of bilateral pleural effusions as well. 3.  Aortic Atherosclerosis (ICD10-I70.0). Electronically Signed   By: Lovena Le M.D.   On: 07/16/2020 00:57   DG Chest 1 View  Result Date: 07/08/2020 CLINICAL DATA:  87 year old female status post intubation. EXAM: CHEST  1 VIEW COMPARISON:  Chest radiograph dated 07/20/2020. FINDINGS: Endotracheal tube with tip entering into the right mainstem bronchus. Recommend retraction by  approximately 5 cm. Left lung base atelectasis versus infiltrate. The right lung is clear. No pleural effusion pneumothorax. The cardiac silhouette is within limits. Atherosclerotic calcification of the aorta. No acute osseous pathology. Degenerative changes of the spine. IMPRESSION: 1. Right mainstem intubation. Recommend retraction by approximately 5 cm. 2. Left lung base atelectasis versus infiltrate. These results were called by telephone at the time of interpretation on 07/08/2020 at 6:56 pm to Nurse Shelby Dubin, who verbally acknowledged these results. Electronically Signed   By: Anner Crete M.D.   On: 07/08/2020 19:03   DG Abd 1 View  Result Date: 07/08/2020 CLINICAL DATA:  87 year old female with adjustment the ETT EXAM: ABDOMEN - 1 VIEW COMPARISON:  Chest radiograph dated 04/16/2011. FINDINGS: Endotracheal tube with tip approximately 3 cm above the carina. Enteric tube extends below the diaphragm with tip in the gastric fundus. Left lung base density may represent atelectasis or infiltrate. No pneumothorax. Small bilateral pleural effusions may be present. The cardiac silhouette is within limits. Atherosclerotic calcification of the aorta. No acute osseous pathology. IMPRESSION: 1. Endotracheal tube above the carina. 2. Left lung base atelectasis versus infiltrate. Electronically Signed   By: Anner Crete M.D.   On: 07/08/2020 21:18   DG Chest Port 1 View  Result Date: 2020-08-12 CLINICAL DATA:  Hypoxia EXAM: PORTABLE CHEST 1 VIEW COMPARISON:  08/12/2020 study obtained earlier in the day FINDINGS: Feeding tube tip is below the diaphragm. There is a persistent small left pleural effusion with increase in consolidation in  the left base. Equivocal small right pleural effusion. Underlying interstitial prominence, likely interstitial edema. There is cardiomegaly with pulmonary venous hypertension. No adenopathy. There is aortic atherosclerosis. There is degenerative change in each  shoulder with superior migration of each humeral head. There is an old healed fracture of the right clavicle. IMPRESSION: Cardiomegaly with pulmonary vascular congestion. Small left pleural effusion. Suspected interstitial edema. Increased opacity left base likely due to combination of atelectasis and pneumonia. Alveolar edema is possible in this area superimposed. The overall appearance is felt to be indicative of a degree of congestive heart failure. Feeding tube tip is below the diaphragm. Superior migration of each humeral head suggests chronic rotator cuff tears bilaterally. Aortic Atherosclerosis (ICD10-I70.0). Electronically Signed   By: Lowella Grip III M.D.   On: 07/23/2020 08:48   DG Chest Port 1 View  Result Date: 07/14/2020 CLINICAL DATA:  Fluid overload. EXAM: PORTABLE CHEST 1 VIEW COMPARISON:  July 11, 2020. FINDINGS: Stable cardiomediastinal silhouette. Feeding tube is seen entering stomach. No pneumothorax is noted. Mild bibasilar atelectasis is noted with probable small pleural effusions. Bony thorax is unremarkable. IMPRESSION: Mild bibasilar subsegmental atelectasis with probable small pleural effusions. Electronically Signed   By: Marijo Conception M.D.   On: 07/14/2020 10:24   DG CHEST PORT 1 VIEW  Result Date: 07/11/2020 CLINICAL DATA:  Acute hypoxic respiratory failure.  Extubation. EXAM: PORTABLE CHEST 1 VIEW COMPARISON:  07/09/2020 and earlier. FINDINGS: Interval extubation. Cardiac silhouette upper normal in size, unchanged. Pulmonary venous hypertension without overt edema. BILATERAL pleural effusions, RIGHT greater than LEFT, and associated passive atelectasis in the lower lobes, slightly increased since the examination 2 days ago. Stable linear atelectasis in the LEFT mid lung. IMPRESSION: 1. BILATERAL pleural effusions, RIGHT greater than LEFT, and associated passive atelectasis involving the lower lobes, slightly increased since the examination 2 days ago. 2. Developing  pulmonary venous hypertension without overt edema. Electronically Signed   By: Evangeline Dakin M.D.   On: 07/11/2020 15:56   DG Chest Port 1 View  Result Date: 07/09/2020 CLINICAL DATA:  ETT, respiratory failure EXAM: PORTABLE CHEST 1 VIEW COMPARISON:  Radiograph 07/08/2020 FINDINGS: *Endotracheal tube terminates 4.3 cm from the carina. *Transesophageal tube coils in the left upper quadrant with administered contrast media in the distal extent of the tube and collecting in the gastric lumen. *Telemetry leads and external support devices overlie the chest. Persistent bandlike opacity in the left lung base. Some developing patchy and interstitial opacities are noted elsewhere in the chest. No pneumothorax or visible effusion. Stable cardiomediastinal contours with a calcified aorta. Severe degenerative changes of the bilateral shoulders including features of rotator cuff insufficiency. Additional degenerative changes in the spine. No acute osseous abnormality or suspicious osseous lesion. IMPRESSION: 1. Satisfactory positioning of the endotracheal, transesophageal tubes. 2. Bandlike opacities in the left lung likely reflect areas of scarring and or atelectasis. 3. Increasing patchy and interstitial opacities elsewhere in the chest, could reflect developing edema or infection. 4.  Aortic Atherosclerosis (ICD10-I70.0). Electronically Signed   By: Lovena Le M.D.   On: 07/09/2020 06:26   DG Chest Portable 1 View  Result Date: 2020/07/26 CLINICAL DATA:  Shortness of breath over the last 5 days. EXAM: PORTABLE CHEST 1 VIEW COMPARISON:  05/10/2020 FINDINGS: Heart size upper limits of normal. Chronic aortic atherosclerotic calcification. Hazy pulmonary infiltrate, more extensive on the right than the left. This could be due to viral or bacterial disease. No effusion. No dense consolidation or collapse. No acute bone finding.  Chronic degenerative changes of the shoulders with likely chronic rotator cuff tears.  IMPRESSION: 1. Hazy pulmonary infiltrate, right worse than left. This could be due to viral or bacterial disease. No dense consolidation or collapse. 2. Aortic atherosclerosis. Electronically Signed   By: Nelson Chimes M.D.   On: 07/05/2020 14:44   ECHOCARDIOGRAM COMPLETE  Result Date: 07/08/2020    ECHOCARDIOGRAM REPORT   Patient Name:   CHALICE LEYDIG Date of Exam: 07/08/2020 Medical Rec #:  [id number]     Height:       64.0 in Accession #:    [id number]    Weight:       130.0 lb Date of Birth:  December 10, 1931      BSA:          1.629 m Patient Age:    35 years      BP:           122/78 mmHg Patient Gender: F             HR:           112 bpm. Exam Location:  Inpatient Procedure: 2D Echo, Cardiac Doppler and Color Doppler Indications:    Atrial fibrillation  History:        Patient has no prior history of Echocardiogram examinations.                 CHF, COPD; Risk Factors:Diabetes, Hypertension and Dyslipidemia.                 GERD.  Sonographer:    Clayton Lefort RDCS (AE) Referring Phys: [place]  1. Left ventricular ejection fraction, by estimation, is 50 to 55%. The left ventricle has low normal function. The left ventricle demonstrates global hypokinesis. There is moderate concentric left ventricular hypertrophy. Left ventricular diastolic function could not be evaluated.  2. Right ventricular systolic function is mildly reduced. The right ventricular size is not well visualized. There is moderately elevated pulmonary artery systolic pressure. The estimated right ventricular systolic pressure is [id number] mmHg.  3. Left atrial size was mild to moderately dilated.  4. The mitral valve is degenerative. Moderate mitral valve regurgitation. No evidence of mitral stenosis. Moderate mitral annular calcification.  5. The aortic valve is tricuspid. There is severe calcifcation of the aortic valve. There is moderate thickening of the aortic valve. The non coronary cusp is immobile. Aortic valve  regurgitation is mild. Mild to moderate aortic valve stenosis. Aortic valve area, by VTI measures 1.23 cm. Aortic valve mean gradient measures 9.8 mmHg. Aortic valve Vmax measures 1.98 m/s. The DI is 0.54. The AVA is underestimated due to small LVOT measurement but likely more than mild AS as the SVI is low at 26. Findings consistent with low flow low gradient Aortic stenosis.  6. The inferior vena cava is dilated in size with <50% respiratory variability, suggesting right atrial pressure of 15 mmHg.  7. Trivial pericardial effusion anterior to the right ventricle. FINDINGS  Left Ventricle: Left ventricular ejection fraction, by estimation, is 50 to 55%. The left ventricle has low normal function. The left ventricle demonstrates global hypokinesis. The left ventricular internal cavity size was normal in size. There is moderate concentric left ventricular hypertrophy. Left ventricular diastolic function could not be evaluated due to atrial fibrillation. Left ventricular diastolic function could not be evaluated. Right Ventricle: The right ventricular size is not well visualized. Right vetricular wall thickness was not well visualized. Right ventricular systolic function is mildly  reduced. There is moderately elevated pulmonary artery systolic pressure. The tricuspid regurgitant velocity is 3.12 m/s, and with an assumed right atrial pressure of 15 mmHg, the estimated right ventricular systolic pressure is [id number] mmHg. Left Atrium: Left atrial size was mild to moderately dilated. Right Atrium: Right atrial size was normal in size. Pericardium: Trivial pericardial effusion is present. The pericardial effusion is anterior to the right ventricle. Presence of pericardial fat pad. Mitral Valve: The mitral valve is degenerative in appearance. There is mild thickening of the anterior mitral valve leaflet(s). There is mild calcification of the anterior mitral valve leaflet(s). Moderate mitral annular calcification. Moderate  mitral valve regurgitation. No evidence of mitral valve stenosis. Tricuspid Valve: The tricuspid valve is normal in structure. Tricuspid valve regurgitation is mild . No evidence of tricuspid stenosis. Aortic Valve: The aortic valve is tricuspid. There is severe calcifcation of the aortic valve. There is moderate thickening of the aortic valve. Aortic valve regurgitation is mild. Mild to moderate aortic stenosis is present. Aortic valve mean gradient measures 9.8 mmHg. Aortic valve peak gradient measures 15.6 mmHg. Aortic valve area, by VTI measures 1.23 cm. Pulmonic Valve: The pulmonic valve was normal in structure. Pulmonic valve regurgitation is not visualized. No evidence of pulmonic stenosis. Aorta: The aortic root is normal in size and structure. Venous: The inferior vena cava is dilated in size with less than 50% respiratory variability, suggesting right atrial pressure of 15 mmHg. IAS/Shunts: No atrial level shunt detected by color flow Doppler.  LEFT VENTRICLE PLAX 2D LVIDd:         2.90 cm LVIDs:         2.40 cm LV PW:         1.40 cm LV IVS:        1.60 cm LVOT diam:     1.70 cm LV SV:         42 LV SV Index:   26 LVOT Area:     2.27 cm  LV Volumes (MOD) LV vol d, MOD A2C: 43.7 ml LV vol d, MOD A4C: 59.4 ml LV vol s, MOD A2C: 27.1 ml LV vol s, MOD A4C: 35.5 ml LV SV MOD A2C:     16.6 ml LV SV MOD A4C:     59.4 ml LV SV MOD BP:      21.4 ml RIGHT VENTRICLE            IVC RV Basal diam:  2.70 cm    IVC diam: 2.10 cm RV S prime:     5.87 cm/s TAPSE (M-mode): 0.9 cm LEFT ATRIUM             Index       RIGHT ATRIUM           Index LA diam:        4.30 cm 2.64 cm/m  RA Area:     14.90 cm LA Vol (A2C):   52.1 ml 31.98 ml/m RA Volume:   33.30 ml  20.44 ml/m LA Vol (A4C):   74.2 ml 45.55 ml/m LA Biplane Vol: 66.6 ml 40.88 ml/m  AORTIC VALVE AV Area (Vmax):    1.10 cm AV Area (Vmean):   1.07 cm AV Area (VTI):     1.23 cm AV Vmax:           197.80 cm/s AV Vmean:          149.200 cm/s AV VTI:  0.341 m AV Peak Grad:      15.6 mmHg AV Mean Grad:      9.8 mmHg LVOT Vmax:         95.56 cm/s LVOT Vmean:        70.260 cm/s LVOT VTI:          0.185 m LVOT/AV VTI ratio: 0.54  AORTA Ao Root diam: 3.00 cm MR Peak grad: 102.4 mmHg  TRICUSPID VALVE MR Mean grad: 71.0 mmHg   TR Peak grad:   38.9 mmHg MR Vmax:      506.00 cm/s TR Vmax:        312.00 cm/s MR Vmean:     399.0 cm/s                           SHUNTS                           Systemic VTI:  0.18 m                           Systemic Diam: 1.70 cm Fransico Him MD Electronically signed by Fransico Him MD Signature Date/Time: 07/08/2020/10:46:00 AM    Final    VAS [place] LOWER EXTREMITY VENOUS (DVT)  Result Date: 07/09/2020  Lower Venous DVT Study Indications: Hypoxia.  Limitations: Patient unable to reposition. and poor ultrasound/tissue interface. Comparison Study: No prior studies. Performing Technologist: Darlin Coco RDMS  Examination Guidelines: A complete evaluation includes B-mode imaging, spectral Doppler, color Doppler, and power Doppler as needed of all accessible portions of each vessel. Bilateral testing is considered an integral part of a complete examination. Limited examinations for reoccurring indications may be performed as noted. The reflux portion of the exam is performed with the patient in reverse Trendelenburg.  +---------+---------------+---------+-----------+----------+--------------+ RIGHT    CompressibilityPhasicitySpontaneityPropertiesThrombus Aging +---------+---------------+---------+-----------+----------+--------------+ CFV      Full           Yes      Yes                                 +---------+---------------+---------+-----------+----------+--------------+ SFJ      Full                                                        +---------+---------------+---------+-----------+----------+--------------+ FV Prox  Full                                                         +---------+---------------+---------+-----------+----------+--------------+ FV Mid   Full                                                        +---------+---------------+---------+-----------+----------+--------------+ FV DistalFull                                                        +---------+---------------+---------+-----------+----------+--------------+  PFV      Full                                                        +---------+---------------+---------+-----------+----------+--------------+ POP      Full           Yes      Yes                                 +---------+---------------+---------+-----------+----------+--------------+ PTV      Full                                                        +---------+---------------+---------+-----------+----------+--------------+ PERO                                                  Not visualized +---------+---------------+---------+-----------+----------+--------------+   +---------+---------------+---------+-----------+----------+--------------+ LEFT     CompressibilityPhasicitySpontaneityPropertiesThrombus Aging +---------+---------------+---------+-----------+----------+--------------+ CFV      Full           Yes      Yes                                 +---------+---------------+---------+-----------+----------+--------------+ SFJ      Full                                                        +---------+---------------+---------+-----------+----------+--------------+ FV Prox  Full                                                        +---------+---------------+---------+-----------+----------+--------------+ FV Mid   Full                                                        +---------+---------------+---------+-----------+----------+--------------+ FV DistalFull                                                         +---------+---------------+---------+-----------+----------+--------------+ PFV      Full                                                        +---------+---------------+---------+-----------+----------+--------------+  POP                     Yes      Yes                                 +---------+---------------+---------+-----------+----------+--------------+ PTV      Full                                                        +---------+---------------+---------+-----------+----------+--------------+ PERO                                                  Not visualized +---------+---------------+---------+-----------+----------+--------------+     Summary: RIGHT: - There is no evidence of deep vein thrombosis in the lower extremity. However, portions of this examination were limited- see technologist comments above.  - No cystic structure found in the popliteal fossa.  LEFT: - There is no evidence of deep vein thrombosis in the lower extremity. However, portions of this examination were limited- see technologist comments above.  - No cystic structure found in the popliteal fossa.  *See table(s) above for measurements and observations. Electronically signed by Deitra Mayo MD on 07/09/2020 at 2:22:53 PM.    Final    DG ESOPHAGUS W SINGLE CM (SOL OR THIN BA)  Result Date: 07/08/2020 CLINICAL DATA:  Difficulty swallowing.  Hypoxia. EXAM: ESOPHAGUS/BARIUM SWALLOW/TABLET STUDY TECHNIQUE: Initial scout AP supine abdominal image obtained to insure adequate colon cleansing. Barium was introduced into the colon in a retrograde fashion and refluxed from the rectum to the cecum. Spot images of the colon followed by overhead radiographs were obtained. FLUOROSCOPY TIME:  Fluoroscopy Time:  2 minutes, 6 seconds Radiation Exposure Index (if provided by the fluoroscopic device): 17.0 mGy Number of Acquired Spot Images: 0 COMPARISON:  Chest radiograph 07/27/2020 FINDINGS: The patient is  infirm and today's examination was performed using thin barium with the patient in the LPO position. Right clavicular deformity compatible with healed fracture. Atherosclerotic calcification of the aortic arch noted. Primary peristaltic waves in the esophagus were intact on 4/4 swallows although there was secondary and tertiary contraction in the distal esophagus on most swallows. Mild distal esophageal fold thickening may reflect esophagitis. There is smooth narrowing of the distal esophagus just above the GE junction. I was only able to distend this area of smooth narrowing up to about 0.7 cm, for example on image 15 of series 3. Accordingly, I am suspicious for a smooth stricture. A 13 mm barium tablet initially impacted in the mid to distal esophagus despite the lack of any narrowing/stricture in this vicinity, but later extended to the distal esophagus beyond which I was not able to further advance the tablet with swallows of water. IMPRESSION: 1. Smooth narrowing of the distal esophagus just above the GE junction. I was only able to distend this area up to about 0.7 cm. A 13 mm barium tablet initially impacted in this vicinity. Although I not see irregularity to suggest tumor, endoscopy to further evaluate this stricture and for potential therapy should be considered when the patient is suitably stable. 2. Mild distal  esophageal fold thickening may reflect esophagitis. Electronically Signed   By: Van Clines M.D.   On: 07/08/2020 13:58    Microbiology No results found for this or any previous visit (from the past 240 hour(s)).  Lab Basic Metabolic Panel: Recent Labs  Lab 07/13/20 0111 07/13/20 1625 07/14/20 0335 07/16/20 0657 07/17/20 0547  NA 137  --  137 133* 131*  K 3.9  --  4.0 4.2 4.4  CL 98  --  98 89* 87*  CO2 26  --  27 31 33*  GLUCOSE 254*  --  176* 266* 278*  BUN 42*  --  32* 26* 30*  CREATININE 1.41*  --  1.09* 0.89 0.91  CALCIUM 9.0  --  8.6* 8.4* 8.6*  MG 1.7 2.2  --   1.8  --   PHOS 1.7* 1.7* 2.0*  --   --    Liver Function Tests: Recent Labs  Lab 07/17/20 0547  AST 19  ALT 29  ALKPHOS 143*  BILITOT 0.9  PROT 6.6  ALBUMIN 2.8*   No results for input(s): LIPASE, AMYLASE in the last 168 hours. No results for input(s): AMMONIA in the last 168 hours. CBC: Recent Labs  Lab 07/13/20 0111 07/14/20 0335 07/15/20 0254 07/16/20 0418 07/17/20 0547  WBC 11.3* 24.0* 18.0* 19.1* 21.6*  HGB 10.9* 11.9* 11.3* 10.6* 11.5*  HCT 36.5 40.3 38.8 36.9 38.0  MCV 88.0 89.6 90.7 91.6 89.0  PLT 317 348 279 231 275   Cardiac Enzymes: No results for input(s): CKTOTAL, CKMB, CKMBINDEX, TROPONINI in the last 168 hours. Sepsis Labs: Recent Labs  Lab 07/14/20 0335 07/15/20 0254 07/16/20 0418 07/16/20 0657 07/17/20 0547 07/26/2020 0443  PROCALCITON <0.10 <0.10  --  <0.10 0.11 0.11  WBC 24.0* 18.0* 19.1*  --  21.6*  --     Griselle Rufer 07/19/2020, 2:36 PM

## 2020-07-30 NOTE — Progress Notes (Signed)
O2 demand continues to increase. Patient is on venturi mask. Titrated to 10L O2 at 45 maintaining saturations between 88-91.

## 2020-07-30 NOTE — Progress Notes (Signed)
Nutrition Brief Note  Chart reviewed. Pt now transitioning to comfort care.  No further nutrition interventions warranted at this time.  Please re-consult as needed.   Shoua Ressler W, RD, LDN, CDCES Registered Dietitian II Certified Diabetes Care and Education Specialist Please refer to [facility] for RD and/or RD on-call/weekend/after hours pager  

## 2020-07-30 NOTE — Progress Notes (Addendum)
Palliative Medicine Inpatient Follow Up Note  HPI: 87 y.o. female  with past medical history of COPD on 3 L, diabetes mellitus, dementia, combined heart failure, and CKD stage III, who was admitted on 07/26/2020 with shortness of breath and altered mental status.  UA showed UTI and cultures revealed VRE.  Speech therapy saw her shortly after admission and felt she was a moderate aspiration risk.  Overnight on January 10 she had worsening work of breathing and nearly arrested.  She required intubation.  She developed septic shock from pneumonia and new atrial fibrillation.  She continues to have atrial fibrillation with rapid ventricular response and is currently on a Cardizem drip.  She intermittently requires BiPAP.  Overnight on January 13 she had episodes of delirium requiring restraints.  Patient has been doing poorly despite coretrack placement and is recurrently aspirating. She has had increasing oxygen requirements and her overall prognosis is poor. Palliative care re-involved to further aid in goals of care conversations.   Today's Discussion (Aug 06, 2020): Chart reviewed. Patient evaluated this morning - noted to be saturating in the 70's had HFNC in place though NRBFM off. I spoke to patients bedside RN, Nira Conn who endorsed that the patient appeared to be doing better yesterday.  I spoke to patient son, Rex. We reviewed Lovella's present health state and how poorly she is oxygenating. He shares that he "doesn't want to see her suffer." He asked a variety of questions and we discussed the poor overall trajectory of Rupa in general and how she has declined tremendously during hospitalization.  We reviewed Marcee's trial-some hospitalization and various comorbid conditions inclusive of COPD on 3 L, diabetes mellitus, dementia, combined heart failure, new afib, and CKD stage III. Rex shares that he is worried the patient "isn't going to make it" but is apprehensive about making decisions without his  sister, Sheila's input.   I went to Glorie's bedside. She remains to have difficulty breathing and is unable to maintain saturations. Dr. Wynelle Cleveland was present with Freda Munro and Rex was called on speaker-phone. Dr. Wynelle Cleveland explained that Terianna is not improving and that at this point intubation given all of Vanna's co-morbidities would not improve her outcomes - if anything it would cause further suffering. The decision was made to make Kayly DNAR/DNI code status. Further conversations we had regarding transitioning focus to comfort measures.   We talked about transition to comfort measures in house and what that would entail inclusive of medications to control pain, dyspnea, agitation, nausea, itching, and hiccups.  We discussed stopping all uneccessary measures such as blood draws, needle sticks, and frequent vital signs. Utilized reflective listening throughout our time together.   Spiritual support has been offered and Shayanne's family is accepting and welcoming of this.   Questions and concerns addressed   Comfort orders placed  Spoke to patients RN, Nira Conn to provide an update.  Objective Assessment: Vital Signs Vitals:   Aug 06, 2020 0417 08-06-20 0800  BP: (!) 116/58 113/77  Pulse:  74  Resp: (!) 24 16  Temp: (!) 97.1 F (36.2 C) 97.8 F (36.6 C)  SpO2: 100% 92%   No intake or output data in the 24 hours ending 2020-08-06 1110 Last Weight  Most recent update: August 06, 2020 12:30 AM   Weight  60 kg (132 lb 4.4 oz)           Gen:  Frail elderly F in respiratory distress HEENT: dry mucous membranes CV: Irregular rate and rhythm  PULM: tachypnea, (+) crackles on HFNC  and NRBFM ABD: soft/nontender  EXT: No edema  Neuro: Alert to self  SUMMARY OF RECOMMENDATIONS   DNAR/DNI  Transition focus to comfort measures  Medications per [facility]  Spiritual Support is very much appreciated  Anticipate in hospital death within hours to days  Discussed with Dr. Wynelle Cleveland  Time In: 1023 Time Out:  1123 Time Spent: 51 Greater than 50% of the time was spent in counseling and coordination of care ______________________________________________________________________________________ Addendum: Called by patient's son Rex, as he felt he was being ignored by staff and Epsie's symptoms were not being well controlled.   I came to bedside and met with the patient's recent nurse Sarah who had just come on shift to discuss the sensitivities of the situation. Sarah and I met with both Rex and his sister, Freda Munro. We developed a plan to better help control Lourie's symptoms inclusive of starting a morphine drip for increased dyspnea. Judson Roch was able to give Joanna a dose of Ativan to help with her agitational state.  We were able to remove Kyliee's nasogastric tube and additional encumbering devices. We took the time to cleanse Moe and reposition her in bed.   Was able to educate family on the reasons to call nursing staff. Encouraged that Rex or Freda Munro call nursing staff whenever they have a question or feel uncertain about exactly what is taking place. Reemphasized that our goal is to keep Lawsyn comfortable as she transitions.  Shared with Freda Munro and Rex that I do not believe Telisa will be with [place] for very long and I think that her duration on this earth will be relatively short. She last if she could stay the night which I went ahead and encouraged.   Requested that our chaplain come by to aid in additional support given this very difficult time.  Provided my direct contact number if any additional needs are not felt to be met. I shared that I can address these matters in a very timely fashion.  Patient's family was very thankful for our time. All questions and concerns addressed.  Time In: 1300 Time Out: 1400 Time Spent: 60 Greater than 50% of the time was spent in counseling and coordination of care  [facility] Team Team Cell Phone: [phone] Please utilize  secure chat with additional questions, if there is no response within 30 minutes please call the above phone number  Palliative Medicine Team providers are available by phone from 7am to 7pm daily and can be reached through the team cell phone.  Should this patient require assistance outside of these hours, please call the patient's attending physician.

## 2020-07-30 NOTE — Progress Notes (Signed)
PROGRESS NOTE    Hannah [name]   [id number]  DOB: [birth year]  DOA: 07/29/2020 PCP: Venetia Maxon, [name], MD   Brief Narrative:  Hannah [name] 87 y.o. female with medical history significant of DM2, COPD on 3L at baseline, GERD, HLD, HTN, dementia, wheelchair-bound who was enrolled in hospice of [name] prior to admission who was recently diagnosed with pneumonia and was started on antibiotics on day of admission brought to the ED  07/09/2020 with hypoxia 79% RA.  She was admitted for septic shock secondary to pneumonia complicated by A-fib with RVR.  On 1/30 she was desaturating. She was intubated- extubated on 1/11 but cor track remained due to dysphagia.  1/13. Needed BiPAP- started on lasix   Treated with IV Amiodarone for A-fib.    Subjective: Events of last night noted. The patient became more hypoxic and was struggling to breath. She was given IV Lasix and placed on a non-rebreather mask. She tells me she is still struggling to breath and is essentially no better after the Lasix. Her daughter is at bedside and asking not to intubate her mother. We have spoken with the patient's son together on the phone with Sharyn Lull (palliative NP) and it has been decided to transition to DNR and full comfort care.     Assessment & Plan:   Principal Problem:   Septic shock  - acute respiratory failure due to Multifocal pneumonia with underlying COPD - upon further investigation with MBS, it appears that the tablet was unable to pass into the stomach- per granddaughter at bedside, the patient has had many esophageal dilatations  - at this time it seems she is too weak to tolerate this - condition has worsened over night and I suspect she may have aspirated again-  - pulse ox is in 80s on 100% FiO2, she is alert and visibly struggling to breath  Active Problems: Esophageal narrowing - see above  - per SLP, ok to trail liquid diet- her respiratory status has limited US from allowing her to start  feeds  Acute exacerbation of systolic CHF - intermittently dosing with IV LaSix - cardiology assisting  A-fib- new onset- persistent - on Amiodarone infusion and per tube Cardizem - cardiology changing Amio to per tube today  VRE UTI - on Linezolid from 1/12- 1/19    Dementia   Hypothyroid - Synthroid    DM2 (diabetes mellitus, type 2) Hemoglobin A1C    Component Value Date/Time   HGBA1C 5.3 07/07/2020 0004   Disposition:  Full comfort measures- expect in hospital death  Time spent in minutes: 28 Code Status: DNR Family Communication: grandaughter at bedside Disposition Plan:  Status is: Inpatient  Remains inpatient appropriate because:IV treatments appropriate due to intensity of illness or inability to take PO   Dispo: The patient is from: home              Anticipated d/c is to: in hospital death expected              Anticipated d/c date is               Patient currently is not medically stable to d/c.   Consultants:   PCCM  Cardiology  Palliative care Procedures:   Intubation/ extubation Antimicrobials:  Anti-infectives (From admission, onward)   Start     Dose/Rate Route Frequency Ordered Stop   07/10/20 1015  linezolid (ZYVOX) IVPB 600 mg        600 mg 300 mL/hr over  60 Minutes Intravenous Every 12 hours 07/10/20 0923 07/17/20 0208   07/09/20 0345  vancomycin (VANCOREADY) IVPB 750 mg/150 mL        750 mg 150 mL/hr over 60 Minutes Intravenous  Once 07/09/20 0250 07/09/20 0506   07/07/20 2300  ceFEPIme (MAXIPIME) 2 g in sodium chloride 0.9 % 100 mL IVPB        2 g 200 mL/hr over 30 Minutes Intravenous Every 24 hours 07/09/2020 2331 07/12/20 0633   07/03/2020 2331  vancomycin variable dose per unstable renal function (pharmacist dosing)  Status:  Discontinued         Does not apply See admin instructions 07/07/2020 2331 07/10/20 0923   07/07/2020 2330  vancomycin (VANCOREADY) IVPB 1250 mg/250 mL        1,250 mg 166.7 mL/hr over 90 Minutes Intravenous   Once 07/13/2020 2325 07/07/20 0317   07/09/2020 2330  ceFEPIme (MAXIPIME) 2 g in sodium chloride 0.9 % 100 mL IVPB        2 g 200 mL/hr over 30 Minutes Intravenous  Once 07/12/2020 2325 07/07/20 0024   07/13/2020 1600  cefTRIAXone (ROCEPHIN) 2 g in sodium chloride 0.9 % 100 mL IVPB  Status:  Discontinued        2 g 200 mL/hr over 30 Minutes Intravenous Every 24 hours 07/05/2020 1557 07/02/2020 2325   07/16/2020 1600  azithromycin (ZITHROMAX) 500 mg in sodium chloride 0.9 % 250 mL IVPB  Status:  Discontinued        500 mg 250 mL/hr over 60 Minutes Intravenous Every 24 hours 07/29/2020 1557 07/09/20 0807       Objective: Vitals:   Jul 27, 2020 0400 July 27, 2020 0405 July 27, 2020 0417 07-27-20 0800  BP:   (!) 116/58 113/77  Pulse:    74  Resp:   (!) 24 16  Temp:   (!) 97.1 F (36.2 C) 97.8 F (36.6 C)  TempSrc:   Axillary Axillary  SpO2: (!) 79% 100% 100% 92%  Weight:      Height:       No intake or output data in the 24 hours ending 2020-07-27 1203 Filed Weights   07/15/20 0435 07/17/20 0057 07-27-2020 0030  Weight: 59.5 kg 58.7 kg 60 kg    Examination: General exam:   - sleepy but awakens and able to answer questions HEENT: PERRLA, oral mucosa moist, no sclera icterus or thrush Respiratory system: RR in 30s- shallow breathing with use of accessory muscles Cardiovascular system: S1 & S2 heard,  No murmurs  Gastrointestinal system: Abdomen soft, non-tender, nondistended. Normal bowel sounds  Central nervous system:. No focal neurological deficits. Extremities: No cyanosis, clubbing or edema Skin: No rashes or ulcers      Data Reviewed: I have personally reviewed following labs and imaging studies  CBC: Recent Labs  Lab 07/13/20 0111 07/14/20 0335 07/15/20 0254 07/16/20 0418 07/17/20 0547  WBC 11.3* 24.0* 18.0* 19.1* 21.6*  HGB 10.9* 11.9* 11.3* 10.6* 11.5*  HCT 36.5 40.3 38.8 36.9 38.0  MCV 88.0 89.6 90.7 91.6 89.0  PLT 317 348 279 231 [id number]   Basic Metabolic Panel: Recent Labs  Lab  07/12/20 0140 07/12/20 1431 07/13/20 0111 07/13/20 1625 07/14/20 0335 07/16/20 0657 07/17/20 0547  NA 135  --  137  --  137 133* 131*  K 4.6  --  3.9  --  4.0 4.2 4.4  CL 103  --  98  --  98 89* 87*  CO2 18*  --  26  --  27  31 33*  GLUCOSE 160*  --  254*  --  176* 266* 278*  BUN 57*  --  42*  --  32* 26* 30*  CREATININE 1.47*  --  1.41*  --  1.09* 0.89 0.91  CALCIUM 8.7*  --  9.0  --  8.6* 8.4* 8.6*  MG  --  2.0 1.7 2.2  --  1.8  --   PHOS  --  2.4* 1.7* 1.7* 2.0*  --   --    GFR: Estimated Creatinine Clearance: 38.3 mL/min (by C-G formula based on SCr of 0.91 mg/dL). Liver Function Tests: Recent Labs  Lab 07/17/20 0547  AST 19  ALT 29  ALKPHOS 143*  BILITOT 0.9  PROT 6.6  ALBUMIN 2.8*   No results for input(s): LIPASE, AMYLASE in the last 168 hours. No results for input(s): AMMONIA in the last 168 hours. Coagulation Profile: No results for input(s): INR, PROTIME in the last 168 hours. Cardiac Enzymes: No results for input(s): CKTOTAL, CKMB, CKMBINDEX, TROPONINI in the last 168 hours. BNP (last 3 results) No results for input(s): PROBNP in the last 8760 hours. HbA1C: No results for input(s): HGBA1C in the last 72 hours. CBG: Recent Labs  Lab 07/17/20 2151 07/14/2020 0028 07/12/2020 0106 07/17/2020 0419 07/16/2020 0824  GLUCAP 105* 138* 131* 192* 137*   Lipid Profile: No results for input(s): CHOL, HDL, LDLCALC, TRIG, CHOLHDL, LDLDIRECT in the last 72 hours. Thyroid Function Tests: No results for input(s): TSH, T4TOTAL, FREET4, T3FREE, THYROIDAB in the last 72 hours. Anemia Panel: No results for input(s): VITAMINB12, FOLATE, FERRITIN, TIBC, IRON, RETICCTPCT in the last 72 hours. Urine analysis:    Component Value Date/Time   COLORURINE AMBER (A) 12-Jul-2020 1637   APPEARANCEUR CLOUDY (A) 2020/07/12 1637   LABSPEC 1.018 July 12, 2020 1637   PHURINE 5.0 07-12-20 1637   GLUCOSEU NEGATIVE 12-Jul-2020 1637   HGBUR NEGATIVE 07/12/2020 1637   BILIRUBINUR NEGATIVE  12-Jul-2020 1637   KETONESUR NEGATIVE 07-12-20 1637   PROTEINUR 30 (A) 07/12/2020 1637   NITRITE NEGATIVE 2020-07-12 1637   LEUKOCYTESUR TRACE (A) 07/12/20 1637   Sepsis Labs: @LABRCNTIP (procalcitonin:4,lacticidven:4) ) Recent Results (from the past 240 hour(s))  MRSA PCR Screening     Status: Abnormal   Collection Time: 07/09/20  4:16 AM   Specimen: Nasal Mucosa; Nasopharyngeal  Result Value Ref Range Status   MRSA by PCR POSITIVE (A) NEGATIVE Final    Comment:        The GeneXpert MRSA Assay (FDA approved for NASAL specimens only), is one component of a comprehensive MRSA colonization surveillance program. It is not intended to diagnose MRSA infection nor to guide or monitor treatment for MRSA infections. RESULT CALLED TO, READ BACK BY AND VERIFIED WITH: A CAIN RN 07/09/20 6720 JDW Performed at [facility], [place] [address] [place], [place] [address]          Radiology Studies: DG Chest 1 View  Result Date: 07/10/2020 CLINICAL DATA:  Shortness of breath EXAM: CHEST  1 VIEW COMPARISON:  July 16, 2020 FINDINGS: The cardiac silhouette is unchanged but enlarged. Aortic calcifications are noted. There are prominent interstitial lung markings and bilateral pleural effusions similar to prior study. There are bibasilar airspace opacities favored to represent atelectasis. There is no pneumothorax. There are end-stage degenerative changes of both glenohumeral joints. There is an old healed right clavicle fracture. IMPRESSION: 1. Cardiomegaly with findings of congestive heart failure. 2. Bibasilar airspace opacities favored to represent atelectasis with an infiltrate not excluded. Electronically  Signed   By: Constance Holster M.D.   On: 07-25-2020 00:39   DG Chest Port 1 View  Result Date: July 25, 2020 CLINICAL DATA:  Hypoxia EXAM: PORTABLE CHEST 1 VIEW COMPARISON:  25-Jul-2020 study obtained earlier in the day FINDINGS: Feeding tube tip is below the diaphragm. There  is a persistent small left pleural effusion with increase in consolidation in the left base. Equivocal small right pleural effusion. Underlying interstitial prominence, likely interstitial edema. There is cardiomegaly with pulmonary venous hypertension. No adenopathy. There is aortic atherosclerosis. There is degenerative change in each shoulder with superior migration of each humeral head. There is an old healed fracture of the right clavicle. IMPRESSION: Cardiomegaly with pulmonary vascular congestion. Small left pleural effusion. Suspected interstitial edema. Increased opacity left base likely due to combination of atelectasis and pneumonia. Alveolar edema is possible in this area superimposed. The overall appearance is felt to be indicative of a degree of congestive heart failure. Feeding tube tip is below the diaphragm. Superior migration of each humeral head suggests chronic rotator cuff tears bilaterally. Aortic Atherosclerosis (ICD10-I70.0). Electronically Signed   By: Lowella Grip III M.D.   On: 07/25/2020 08:48      Scheduled Meds: . diclofenac Sodium  2 g Topical QID  . glycopyrrolate  0.4 mg Intravenous Q4H   Continuous Infusions:    LOS: 12 days      Debbe Odea, MD [facility] Pager: [email or web address] Jul 25, 2020, 12:03 PM

## 2020-07-30 NOTE — Care Management Important Message (Signed)
Important Message  Patient Details  Name: JAZ LANINGHAM MRN: [id number] Date of Birth: [birth year]   Medicare Important Message Given:  Yes-pt. on precaution left the IM letter in the tent outside the door for nurse to deliver to pt.     Holli Humbles Smith 07/28/2020, 12:45 PM

## 2020-07-30 NOTE — Progress Notes (Signed)
Progress Note  Patient Name: Hannah [name] Date of Encounter: 2020-07-22  [facility] Cardiologist: Mertie Moores, MD   Subjective   Worsening SOB from prior with increasing oxygen requirements.  Received 40 IV lasix overnight.  Patient feels better than yesterday  Inpatient Medications    Scheduled Meds: . [START ON 07/24/2020] amiodarone  200 mg Per Tube Daily  . amiodarone  400 mg Per Tube BID  . apixaban  2.5 mg Per Tube BID  . chlorhexidine gluconate (MEDLINE KIT)  15 mL Mouth Rinse BID  . Chlorhexidine Gluconate Cloth  6 each Topical Daily  . diclofenac Sodium  2 g Topical QID  . diltiazem  60 mg Per Tube Q6H  . docusate  100 mg Per Tube BID  . feeding supplement (PROSource TF)  45 mL Per Tube Daily  . furosemide  40 mg Intravenous Daily  . insulin aspart  0-9 Units Subcutaneous Q4H  . insulin glargine  8 Units Subcutaneous Daily  . levothyroxine  25 mcg Per Tube QAC breakfast  . polyethylene glycol  17 g Per Tube Daily   Continuous Infusions: . sodium chloride Stopped (07/16/20 0201)  . feeding supplement (OSMOLITE 1.2 CAL) 1,000 mL (07/16/20 0630)   PRN Meds: sodium chloride, acetaminophen (TYLENOL) oral liquid 160 mg/5 mL **OR** acetaminophen, albuterol, hydrALAZINE, labetalol, Racepinephrine HCl   Vital Signs    Vitals:   July 22, 2020 0400 2020-07-22 0405 07-22-20 0417 07-22-2020 0800  BP:   (!) 116/58 113/77  Pulse:    74  Resp:   (!) 24 16  Temp:   (!) 97.1 F (36.2 C) 97.8 F (36.6 C)  TempSrc:   Axillary Axillary  SpO2: (!) 79% 100% 100% 92%  Weight:      Height:       No intake or output data in the 24 hours ending 2020-07-22 0921 Last 3 Weights 07-22-20 07/17/2020 07/15/2020  Weight (lbs) 132 lb 4.4 oz 129 lb 6.6 oz 131 lb 2.8 oz  Weight (kg) 60 kg 58.7 kg 59.5 kg      Telemetry    Atrial fibrillation with rate control - Personally Reviewed  ECG    No new tracings - Personally Reviewed  Physical Exam   GEN: frail elderly female appearing  mildly short of breath.   Neck: No JVD Cardiac: IRIR, +murmur, no rubs or gallops.  Respiratory: coarse upper airway sounds anteriorly with diminished breath sounds bilaterally GI: Soft, nontender, non-distended  MS: No edema; No deformity. Neuro:  Nonfocal  Psych: demented, calm, in mittens  Labs    High Sensitivity Troponin:   Recent Labs  Lab 07/08/20 1828 07/08/20 2028  TROPONINIHS 61* 59*      Chemistry Recent Labs  Lab 07/14/20 0335 07/16/20 0657 07/17/20 0547  NA 137 133* 131*  K 4.0 4.2 4.4  CL 98 89* 87*  CO2 27 31 33*  GLUCOSE 176* 266* 278*  BUN 32* 26* 30*  CREATININE 1.09* 0.89 0.91  CALCIUM 8.6* 8.4* 8.6*  PROT  --   --  6.6  ALBUMIN  --   --  2.8*  AST  --   --  19  ALT  --   --  29  ALKPHOS  --   --  143*  BILITOT  --   --  0.9  GFRNONAA 49* >60 >60  ANIONGAP $RemoveB'12 13 11     'vPYlcwgE$ Hematology Recent Labs  Lab 07/15/20 0254 07/16/20 0418 07/17/20 0547  WBC 18.0* 19.1* 21.6*  RBC  4.28 4.03 4.27  HGB 11.3* 10.6* 11.5*  HCT 38.8 36.9 38.0  MCV 90.7 91.6 89.0  MCH 26.4 26.3 26.9  MCHC 29.1* 28.7* 30.3  RDW 17.2* 17.0* 17.1*  PLT 279 231 275    BNP Recent Labs  Lab 2020-07-24 0443  BNP 442.3*     DDimer No results for input(s): DDIMER in the last 168 hours.   Radiology    DG Chest 1 View  Result Date: 2020/07/24 CLINICAL DATA:  Shortness of breath EXAM: CHEST  1 VIEW COMPARISON:  July 16, 2020 FINDINGS: The cardiac silhouette is unchanged but enlarged. Aortic calcifications are noted. There are prominent interstitial lung markings and bilateral pleural effusions similar to prior study. There are bibasilar airspace opacities favored to represent atelectasis. There is no pneumothorax. There are end-stage degenerative changes of both glenohumeral joints. There is an old healed right clavicle fracture. IMPRESSION: 1. Cardiomegaly with findings of congestive heart failure. 2. Bibasilar airspace opacities favored to represent atelectasis with an  infiltrate not excluded. Electronically Signed   By: Katherine Mantle M.D.   On: 07/24/20 00:39   DG Chest Port 1 View  Result Date: July 24, 2020 CLINICAL DATA:  Hypoxia EXAM: PORTABLE CHEST 1 VIEW COMPARISON:  07-24-20 study obtained earlier in the day FINDINGS: Feeding tube tip is below the diaphragm. There is a persistent small left pleural effusion with increase in consolidation in the left base. Equivocal small right pleural effusion. Underlying interstitial prominence, likely interstitial edema. There is cardiomegaly with pulmonary venous hypertension. No adenopathy. There is aortic atherosclerosis. There is degenerative change in each shoulder with superior migration of each humeral head. There is an old healed fracture of the right clavicle. IMPRESSION: Cardiomegaly with pulmonary vascular congestion. Small left pleural effusion. Suspected interstitial edema. Increased opacity left base likely due to combination of atelectasis and pneumonia. Alveolar edema is possible in this area superimposed. The overall appearance is felt to be indicative of a degree of congestive heart failure. Feeding tube tip is below the diaphragm. Superior migration of each humeral head suggests chronic rotator cuff tears bilaterally. Aortic Atherosclerosis (ICD10-I70.0). Electronically Signed   By: Bretta Bang III M.D.   On: 07-24-20 08:48    Cardiac Studies   Echocardiogram 07/08/20: 1. Left ventricular ejection fraction, by estimation, is 50 to 55%. The  left ventricle has low normal function. The left ventricle demonstrates  global hypokinesis. There is moderate concentric left ventricular  hypertrophy. Left ventricular diastolic  function could not be evaluated.   2. Right ventricular systolic function is mildly reduced. The right  ventricular size is not well visualized. There is moderately elevated  pulmonary artery systolic pressure. The estimated right ventricular  systolic pressure is 53.9  mmHg.   3. Left atrial size was mild to moderately dilated.   4. The mitral valve is degenerative. Moderate mitral valve regurgitation.  No evidence of mitral stenosis. Moderate mitral annular calcification.   5. The aortic valve is tricuspid. There is severe calcifcation of the  aortic valve. There is moderate thickening of the aortic valve. The non  coronary cusp is immobile. Aortic valve regurgitation is mild. Mild to  moderate aortic valve stenosis. Aortic  valve area, by VTI measures 1.23 cm. Aortic valve mean gradient measures  9.8 mmHg. Aortic valve Vmax measures 1.98 m/s. The DI is 0.54. The AVA is  underestimated due to small LVOT measurement but likely more than mild AS  as the SVI is low at 26.  Findings consistent with  low flow low gradient Aortic stenosis.   6. The inferior vena cava is dilated in size with <50% respiratory  variability, suggesting right atrial pressure of 15 mmHg.   7. Trivial pericardial effusion anterior to the right ventricle.   Patient Profile     87 y.o. female with PMH of HTN, HLD, DM type 2, COPD on home O2 3L, and dementia who is being followed by cardiology for atrial fibrillation with RVR  Assessment & Plan    Atrial fibrillation with RVR: rates remain poorly controlled. Suspect this is driven by underlying UTI.  - on diltiazem and amiodarone 400 mg PO BID for one week then 200 mg PO Daily, given volume issues - Can continue eliquis 2.5mg  BID (adjusted for age and weight), though ultimately is a poor candidate at home due to fall risk.  Acute on chronic respiratory failure in patient with COPD on home O2: patient initially required BiPAP on arrival worsening O2 requirement with per tube lasix - elevated BNP - would transition to lasix 40 mg IV daily to see if we can improve he hypoxic respiratory failure (ordered) - would return to charting I/Os - Continue management per primary team  HTN: BP persistently elevated.  - improved from day prior  without intervention  Remainder of care per primary team: - Hypothyroidism - dysphagia -sepsis - DM type 2 - Encephalopathy   For questions or updates, please contact [place] Please consult [email or web address] for contact info under        Signed, Werner Lean, MD  08-07-2020, 9:21 AM

## 2020-07-30 NOTE — Progress Notes (Signed)
This chaplain responded PMT consult for EOL spiritual care.  Three generations are bedside and waiting in the lobby for the Pt. they call [place].    The Pt. is finding difficulty relaxing her arms at the time of the visit.  The RN responded to the Pt. and the family concerns about the Pt. remaining comfortable.    The chaplain listened to stories of [place]'s loving relationship with her grandchildren and great grandchildren. The chaplain understands the Pt. always has time on the weekend for the grandchildren.  The family accepted F/U spiritual care as needed.

## 2020-07-30 DEATH — deceased

## 2023-01-06 IMAGING — DX DG CHEST 1V PORT
1 series · 1 of 1 positions shown · non-contrast
Comparison: July 18, 2020 study obtained earlier in the day

CLINICAL DATA: Hypoxia

EXAM:
PORTABLE CHEST 1 VIEW

[chest ap]
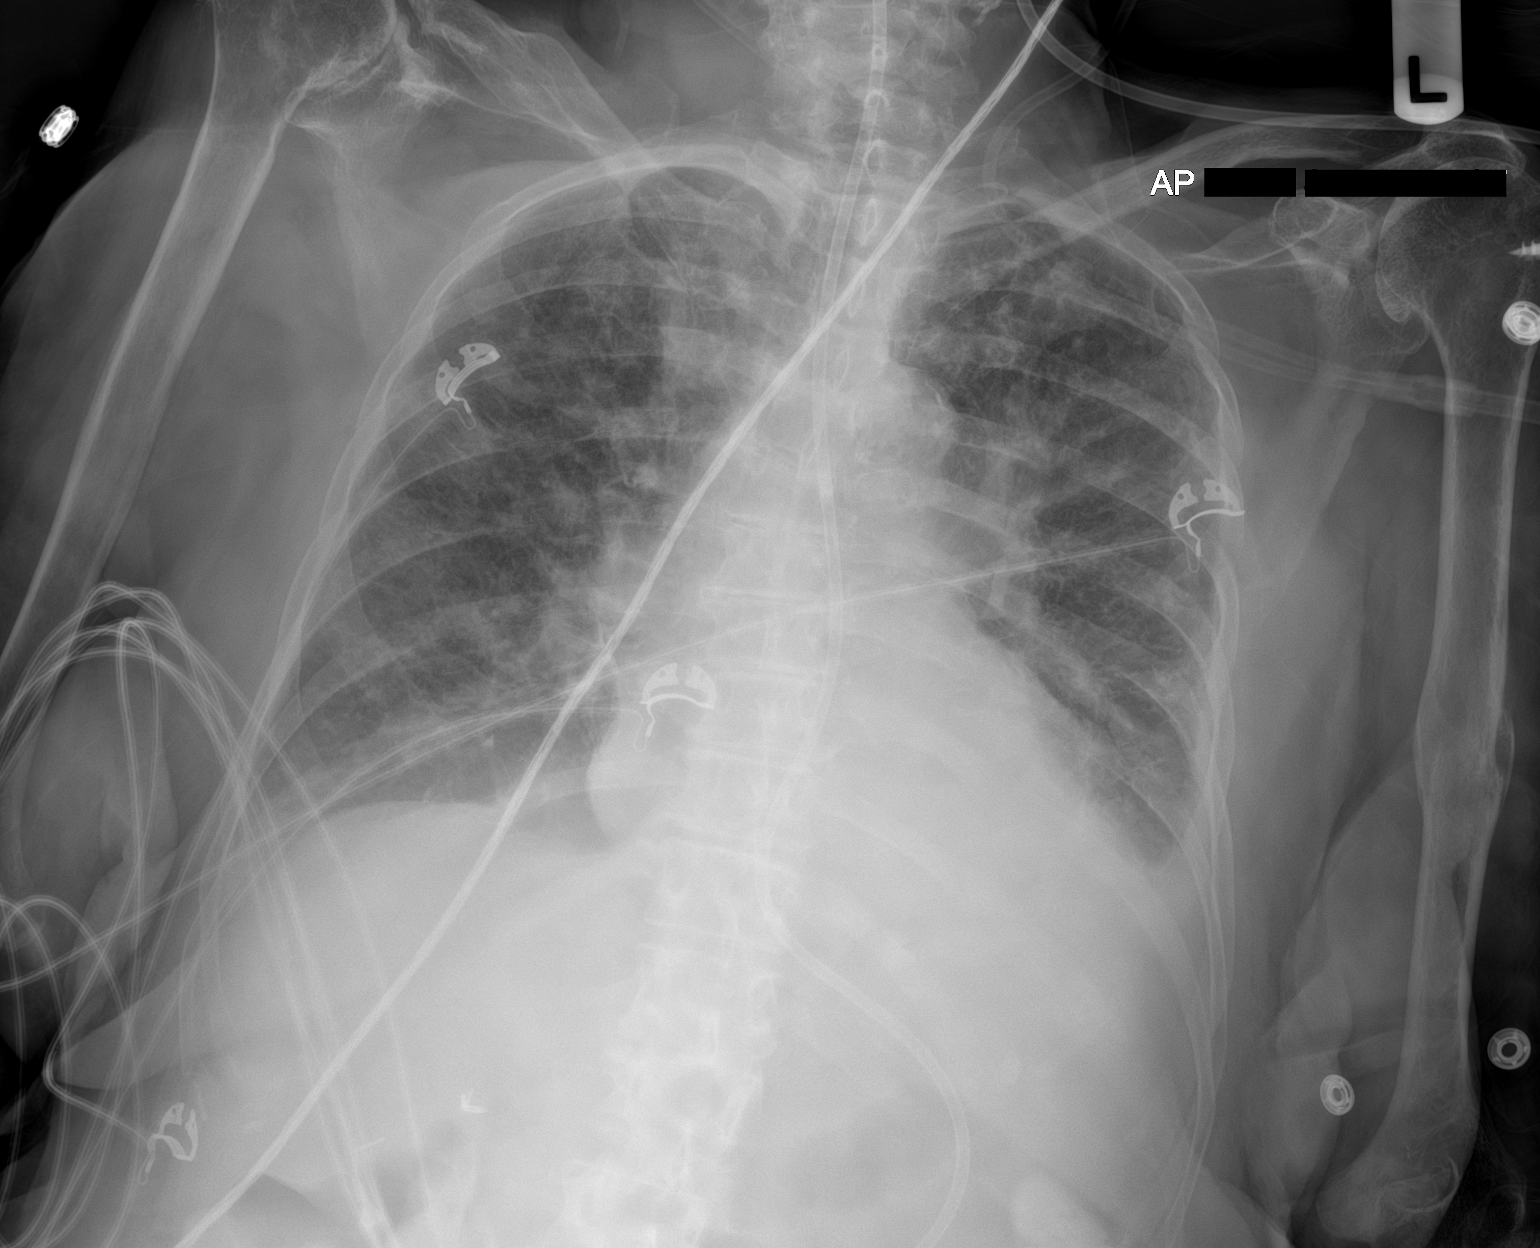

[1 of 1 positions shown; findings below may reference images not displayed]

FINDINGS: Feeding tube tip is below the diaphragm. There is a persistent small
left pleural effusion with increase in consolidation in the left
base. Equivocal small right pleural effusion. Underlying
interstitial prominence, likely interstitial edema. There is
cardiomegaly with pulmonary venous hypertension. No adenopathy.
There is aortic atherosclerosis. There is degenerative change in
each shoulder with superior migration of each humeral head. There is
an old healed fracture of the right clavicle.
IMPRESSION: Cardiomegaly with pulmonary vascular congestion. Small left pleural
effusion. Suspected interstitial edema. Increased opacity left base
likely due to combination of atelectasis and pneumonia. Alveolar
edema is possible in this area superimposed. The overall appearance
is felt to be indicative of a degree of congestive heart failure.

Feeding tube tip is below the diaphragm. Superior migration of each
humeral head suggests chronic rotator cuff tears bilaterally.

Aortic Atherosclerosis ([id number]).
